# Patient Record
Sex: Male | Born: 1935 | Race: White | Hispanic: No | Marital: Married | Smoking: Never smoker
Health system: Southern US, Community
[De-identification: ages and names within clinical notes are randomized; demographics above are authoritative.]

## PROBLEM LIST (undated history)

## (undated) DIAGNOSIS — E039 Hypothyroidism, unspecified: Secondary | ICD-10-CM

## (undated) DIAGNOSIS — N189 Chronic kidney disease, unspecified: Secondary | ICD-10-CM

## (undated) DIAGNOSIS — E119 Type 2 diabetes mellitus without complications: Secondary | ICD-10-CM

## (undated) DIAGNOSIS — C61 Malignant neoplasm of prostate: Secondary | ICD-10-CM

## (undated) DIAGNOSIS — I4891 Unspecified atrial fibrillation: Secondary | ICD-10-CM

## (undated) DIAGNOSIS — F0390 Unspecified dementia without behavioral disturbance: Secondary | ICD-10-CM

## (undated) DIAGNOSIS — R739 Hyperglycemia, unspecified: Secondary | ICD-10-CM

## (undated) HISTORY — PX: OTHER SURGICAL HISTORY: SHX169

## (undated) HISTORY — PX: PROSTATE SURGERY: SHX751

## (undated) HISTORY — PX: SINUS EXPLORATION: SHX5214

---

## 1969-05-28 HISTORY — PX: OTHER SURGICAL HISTORY: SHX169

## 1999-09-18 ENCOUNTER — Ambulatory Visit: Admission: RE | Admit: 1999-09-18 | Discharge: 1999-09-18 | Payer: Self-pay | Admitting: Family Medicine

## 2001-06-11 ENCOUNTER — Encounter (INDEPENDENT_AMBULATORY_CARE_PROVIDER_SITE_OTHER): Payer: Self-pay | Admitting: Specialist

## 2001-06-11 ENCOUNTER — Observation Stay (HOSPITAL_COMMUNITY): Admission: RE | Admit: 2001-06-11 | Discharge: 2001-06-12 | Payer: Self-pay | Admitting: General Surgery

## 2002-04-16 ENCOUNTER — Inpatient Hospital Stay (HOSPITAL_COMMUNITY): Admission: EM | Admit: 2002-04-16 | Discharge: 2002-04-19 | Payer: Self-pay | Admitting: Emergency Medicine

## 2002-05-25 ENCOUNTER — Ambulatory Visit (HOSPITAL_COMMUNITY): Admission: RE | Admit: 2002-05-25 | Discharge: 2002-05-25 | Payer: Self-pay | Admitting: Gastroenterology

## 2002-05-25 ENCOUNTER — Encounter (INDEPENDENT_AMBULATORY_CARE_PROVIDER_SITE_OTHER): Payer: Self-pay

## 2002-06-22 ENCOUNTER — Observation Stay (HOSPITAL_COMMUNITY): Admission: RE | Admit: 2002-06-22 | Discharge: 2002-06-23 | Payer: Self-pay | Admitting: General Surgery

## 2002-06-22 ENCOUNTER — Encounter (INDEPENDENT_AMBULATORY_CARE_PROVIDER_SITE_OTHER): Payer: Self-pay

## 2002-08-02 ENCOUNTER — Emergency Department (HOSPITAL_COMMUNITY): Admission: EM | Admit: 2002-08-02 | Discharge: 2002-08-02 | Payer: Self-pay | Admitting: Emergency Medicine

## 2003-02-04 ENCOUNTER — Inpatient Hospital Stay (HOSPITAL_COMMUNITY): Admission: EM | Admit: 2003-02-04 | Discharge: 2003-02-09 | Payer: Self-pay | Admitting: Emergency Medicine

## 2003-02-05 ENCOUNTER — Encounter: Payer: Self-pay | Admitting: Internal Medicine

## 2003-02-06 ENCOUNTER — Encounter: Payer: Self-pay | Admitting: Internal Medicine

## 2007-12-27 ENCOUNTER — Inpatient Hospital Stay (HOSPITAL_COMMUNITY): Admission: EM | Admit: 2007-12-27 | Discharge: 2007-12-30 | Payer: Self-pay | Admitting: Emergency Medicine

## 2009-09-17 ENCOUNTER — Inpatient Hospital Stay (HOSPITAL_COMMUNITY): Admission: EM | Admit: 2009-09-17 | Discharge: 2009-09-19 | Payer: Self-pay | Admitting: Emergency Medicine

## 2010-08-09 ENCOUNTER — Other Ambulatory Visit: Payer: Self-pay | Admitting: Dermatology

## 2010-08-15 LAB — GLUCOSE, CAPILLARY
Glucose-Capillary: 112 mg/dL — ABNORMAL HIGH (ref 70–99)
Glucose-Capillary: 156 mg/dL — ABNORMAL HIGH (ref 70–99)
Glucose-Capillary: 161 mg/dL — ABNORMAL HIGH (ref 70–99)
Glucose-Capillary: 179 mg/dL — ABNORMAL HIGH (ref 70–99)

## 2010-08-15 LAB — DIFFERENTIAL
Basophils Absolute: 0.1 10*3/uL (ref 0.0–0.1)
Basophils Relative: 0 % (ref 0–1)
Basophils Relative: 1 % (ref 0–1)
Eosinophils Absolute: 0.2 10*3/uL (ref 0.0–0.7)
Eosinophils Relative: 1 % (ref 0–5)
Lymphocytes Relative: 8 % — ABNORMAL LOW (ref 12–46)
Lymphs Abs: 3.9 10*3/uL (ref 0.7–4.0)
Monocytes Relative: 9 % (ref 3–12)
Neutro Abs: 18.4 10*3/uL — ABNORMAL HIGH (ref 1.7–7.7)
Neutrophils Relative %: 66 % (ref 43–77)
Neutrophils Relative %: 86 % — ABNORMAL HIGH (ref 43–77)

## 2010-08-15 LAB — CBC
HCT: 34.9 % — ABNORMAL LOW (ref 39.0–52.0)
Hemoglobin: 13.4 g/dL (ref 13.0–17.0)
MCHC: 32.9 g/dL (ref 30.0–36.0)
MCHC: 33 g/dL (ref 30.0–36.0)
MCHC: 33.4 g/dL (ref 30.0–36.0)
MCV: 92.7 fL (ref 78.0–100.0)
MCV: 93.4 fL (ref 78.0–100.0)
Platelets: 132 10*3/uL — ABNORMAL LOW (ref 150–400)
Platelets: 174 10*3/uL (ref 150–400)
RBC: 3.77 MIL/uL — ABNORMAL LOW (ref 4.22–5.81)
RDW: 13.7 % (ref 11.5–15.5)
RDW: 14 % (ref 11.5–15.5)
WBC: 16.7 10*3/uL — ABNORMAL HIGH (ref 4.0–10.5)

## 2010-08-15 LAB — BASIC METABOLIC PANEL
Chloride: 103 mEq/L (ref 96–112)
Creatinine, Ser: 1.7 mg/dL — ABNORMAL HIGH (ref 0.4–1.5)
GFR calc Af Amer: 46 mL/min — ABNORMAL LOW (ref >60)
Glucose, Bld: 285 mg/dL — ABNORMAL HIGH (ref 70–99)

## 2010-08-15 LAB — CULTURE, BLOOD (ROUTINE X 2)

## 2010-08-15 LAB — URINALYSIS, ROUTINE W REFLEX MICROSCOPIC
Glucose, UA: 500 mg/dL — AB
Ketones, ur: NEGATIVE mg/dL
Leukocytes, UA: NEGATIVE
Protein, ur: NEGATIVE mg/dL
pH: 6.5 (ref 5.0–8.0)

## 2010-08-15 LAB — HEMOGLOBIN A1C
Hgb A1c MFr Bld: 8.3 % — ABNORMAL HIGH (ref <5.7)
Mean Plasma Glucose: 192 mg/dL — ABNORMAL HIGH (ref <117)

## 2010-08-15 LAB — D-DIMER, QUANTITATIVE: D-Dimer, Quant: 0.42 ug/mL-FEU (ref 0.00–0.48)

## 2010-08-15 LAB — SEDIMENTATION RATE: Sed Rate: 15 mm/hr (ref 0–16)

## 2010-10-10 NOTE — H&P (Signed)
NAME:  [name], Daniel NO.:  [name]   MEDICAL RECORD NO.:  [name]          PATIENT TYPE:  EMS   LOCATION:  ED                           FACILITY:  [facility] [place]   PHYSICIAN:  Ramiro Harvest, MD    DATE OF BIRTH:  1913/11/17   DATE OF ADMISSION:  12/27/2007  DATE OF DISCHARGE:                              HISTORY & PHYSICAL   PRIMARY CARE PHYSICIAN:  Dr. Tally Joe of [facility].   HISTORY OF PRESENT ILLNESS:  [name] is a 77 year old white male  with a history of chronically deformed right lower extremity secondary  to multiple previous surgeries for melanoma, which was done over 30  years ago, history of type 2 diabetes, hypertension, chronic venous  stasis changes, who presents to the ED with a 1-day history of chills,  right lower extremity pain, erythema and warmth.  The patient denies any  radiation of the pain.  The pain is mainly from the knee downwards.  No  fever, no drainage, no edema.  The patient denies any chest pain.  No  shortness of breath, no nausea, no vomiting, no abdominal pain, no  diarrhea, no constipation.  No cough, no melena, no hematemesis, no  hematochezia.  No focal neurological symptoms.  In the ED, the patient  was found to have a right lower extremity cellulitis.  White count of  18.7, hemoglobin of 14.1, hematocrit of 41, platelets of 169 and an ANC  of 17.1.  UA was bland.  BMET with a sodium of 138, potassium 4.0,  chloride 102, bicarb 26, glucose 136, BUN 35, creatinine 1.86, calcium  of 9.5.  Chest x-ray was negative.  We were called to admit the patient  for further evaluation and management.   ALLERGIES:  NO KNOWN DRUG ALLERGIES.   PAST MEDICAL HISTORY:  1. Recurrent cellulitis of the right lower extremity.  2. Type 2 diabetes.  3. Hypertension.  4. History of melanoma of the right lower extremity, approximately 30      years ago with multiple surgical resections and skin grafts.  5. Chronic venous stasis  changes.  6. Chronic deafness.  7. BPH, status post TURP.  8. Recurrent villous adenoma of the distal rectum with high-grade      dysplasia, status post transanal excision of neoplastic rectal      polyp per Dr. Derrell Lolling on June 11, 2001 and June 22, 2002.  9. Multiple small colonic polyps, status post polypectomy per      colonoscopy of May 25, 2002.  10.  Sigmoid diverticulosis per      colonoscopy of May 25, 2002.   HOME MEDICATIONS:  1. Amaryl 4 mg p.o. daily.  2. Maxzide 37.5/25 mg p.o. daily.  3. Zymar eye drops 1 drop to the right eye q.i.d., 0.3%.  4. Glucophage 1 gm b.i.d.  5. Lipitor 20 mg daily, which was last filled Oct 17, 2007.  These      medications were obtained from the pharmacist.   SOCIAL HISTORY:  The patient lives in [place] with his wife.  He is a  retired Optician, dispensing.  He  denies any tobacco use.  No alcohol use.  No IV  drug use.  Has two children, one son and one daughter, all of whom are  healthy.   FAMILY HISTORY:  Noncontributory.   REVIEW OF SYSTEMS:  As per HPI.   PHYSICAL EXAMINATION:  VITAL SIGNS:  Temperature 98.9, blood pressure  146/104, down to 154/72, pulse of 98, respiratory rate 20. Satting 97%  on room air.  GENERAL:  Patient in no apparent distress.  HEENT:  Normocephalic, atraumatic.  Pupils equal, round and reactive to  light.  Extraocular movements intact.  Oropharynx is clear.  No lesions,  no exudates.  Dry mucous membranes.  NECK:  Supple.  No lymphadenopathy.  RESPIRATORY:  Lungs are clear to auscultation bilaterally.  No wheezes,  no rhonchi, no crackles.  CARDIOVASCULAR:  Regular rate and rhythm.  No  murmurs, rubs or gallops.  ABDOMEN:  Soft, nontender, nondistended.  Positive bowel sounds.  EXTREMITIES:  No clubbing or cyanosis.  Right lower extremity is  deformed secondary to previous surgeries on the distal leg.  Right lower  extremity with erythema extending from the foot to the upper five,  positive for  warmth.  Positive tenderness to palpation.  No drainage.  No edema.  NEUROLOGICAL:  The patient is alert and oriented x3.  Cranial nerves II-  XII are grossly intact.  No focal deficits.   LABORATORY DATA:  Sodium 138, potassium 4.0, chloride 102, bicarb 26,  BUN 35, creatinine 1.86, glucose of 136, calcium of 9.5.  CBC - white  count 18.7, hemoglobin 14.1, platelets 163, hematocrit 41.0, ANC of  17.1.  Chest x-ray, no acute chest process.  UA is yellow clear,  specific gravity 1.014, pH of 7.5, glucose negative, bilirubin negative,  ketones trace, blood negative, protein negative, urobilinogen 0.2,  nitrite negative, leukocytes negative.   ASSESSMENT AND PLAN:  [name] is a 77 year old gentleman  with history of deformed right lower extremity secondary to multiple  previous surgeries for melanoma over 30 years ago, history of type 2  diabetes, hypertension, chronic venous stasis changes, who presents to  the ED with 1 day history of chills, right lower extremity pain,  erythema and warmth.  Found to have a right lower extremity cellulitis.   1. Right lower extremity cellulitis.  Will admit the patient to a      regular bed.  Check blood cultures x2.  Check a hepatic panel.      Check coags.  Will place on clindamycin 900 mg IV q.8 h.  Pain      management with Darvocet, keep lower extremity elevated and      monitor.  2. Type 2 diabetes.  Check a hemoglobin A1c and place on sliding scale      insulin.  3. Dehydration.  IV fluids.  4. Acute renal failure.  Baseline creatinine of 1.3 per E chart in      2004, likely prerenal versus renal versus post renal.  Check a      fractional excretion of sodium, IV fluids.  Follow creatinine.  If      no improvement, will check a renal ultrasound.  5. Hypertension.  Hold blood pressure medications for now.  6. Leukocytosis secondary to problem #1.  Urinalysis was negative for      urinary tract infection.  Chest x-ray is clear.  See  problem #1.  7. Benign prostatic hypertrophy, status post transurethral resection      of prostate.  8.  Chronic deafness.  9. Recurrent villous adenoma of the distal rectum, status post      excision x2.  10.Prophylaxis; Protonix for GI prophylaxis.  Heparin for DVT      prophylaxis.   I has been pleasure taking care of Mr. Hobert Poplaski.      Ramiro Harvest, MD  Electronically Signed     DT/MEDQ  D:  12/27/2007  T:  12/27/2007  Job:  [id number]   cc:   Tally Joe, M.D.  Fax: [phone]

## 2010-10-13 NOTE — Op Note (Signed)
NAME:  [name], [name] NO.:  [id number]   MEDICAL RECORD NO.:  [id number]                   PATIENT TYPE:   LOCATION:                                       FACILITY:   PHYSICIAN:  Bernette Redbird, M.D.                DATE OF BIRTH:  February 16, 1914   DATE OF PROCEDURE:  05/25/2002  DATE OF DISCHARGE:                                 OPERATIVE REPORT   PROCEDURE:  Colonoscopy with polypectomy and biopsy.   INDICATIONS FOR PROCEDURE:  20 six year-old who is one year status post  transanal excision of a highly dysplastic rectal polyp at the anorectal  junction.   FINDINGS:  Recurrence of the rectal polyp.  Small scattered colonic polyps  removed.  Mild sigmoid diverticulosis.   PROCEDURE:  The nature, purpose and risks of the procedure were familiar to  the patient from prior examination and he provided written consent.  Sedation was Fentanyl 50 mcg and Versed 6 mg IV without clinical  instability. His minimal 02 saturation was transiently down to 86%, but it  spontaneously came back into the 90's after we turned up the 02 flow rate.   The Olympus adult video colonoscope was quite easily advanced around the  colon, using some external abdominal compression to control looping.  The  cecum was reached and pullback was initiated.   The main finding on this exam was apparent recurrence of the distal rectal  polyp removed by transanal excision by Dr. Claud Kelp about a year ago.  Interestingly, the polyp was similar in size and location to that noted and  excised a year ago.  Specifically, the polyp was just above the anorectal  junction or dentate line and probably measured about 1.5 to 2.0 cm across in  greatest dimension, probably about 1.5 cm high.  It was soft, fleshy and  friable.  There was no firmness or hardness to it nor any obvious necrosis  or ulceration to suggest malignancy.  Multiple biopsies were obtained of it  in the retroflex  orientation which was the best way to view the lesion.  Antegrade, it appeared that there was just a margin of a few mm between the  inferior margin of the polyp and the dentate line.   In addition to that main lesion, I encountered several small or diminutive  polyps on this exam.  The largest was about 4 mm across and was removed by  snare technique at about 30 to 35 cm from the external anal opening and then  there were several diminutive sessile polyps scattered elsewhere in the  colon removed by cold biopsy technique.   There was no sign of frank cancer, colitis or vascular malformations.   The patient's prostate gland felt unremarkable.  Interestingly, on digital  exam, the small distal rectal polyp was really not palpable to my exam,  probably due to its very soft character.  The patient tolerated this procedure well and there were no apparent  complications.   IMPRESSION:  1. Apparent recurrence of fairly large friable distal rectal polyp,     pathology pending.  2.     Multiple small colon polyps removed as described above.  3. Sigmoid diverticulosis.   PLAN:  Await pathology results.                                                Bernette Redbird, M.D.    RB/MEDQ  D:  05/25/2002  T:  05/25/2002  Job:  [id number]   cc:   Meredith Staggers, M.D.  [address], [address]  [place]  [place] [address]  Fax: [phone]   Angelia Mould. Derrell Lolling, M.D.  [address], [address]  [place]  [place] [address]  Fax: [phone]

## 2010-10-13 NOTE — Discharge Summary (Signed)
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  He is quite vigorous, but  has a chronically deformed right leg secondary to multiple previous melanoma  surgeries over the previous 30 years.  Ten years ago a chemo perfusion  procedure was carried out on the leg.  The leg shows evidence of chronic  venous stasis.  On Monday, prior to admission, he noted the onset of  increased pain in the leg with associated redness in the foot area.  Over  the subsequent four days, the redness spread to mid thigh, and was  associated with malaise.  The patient recalls that he had one episode of  chills and rigors.  Also noteworthy, was that the patient's diabetes which  was normally under very good regulation became uncontrolled with a blood  sugar up to 296.  Examination of the leg showed evidence of a rather  profound cellulitis, and therefore the patient was admitted for IV  antibiotics.   PHYSICAL EXAMINATION:  VITAL SIGNS:  Blood pressure 170/83, pulse 75,  respirations 20.  HEENT:  Within normal limits.  CHEST:  Clear.  BACK:  No CVA or point tenderness.  CARDIOVASCULAR:  Normal S1 and S2 without murmurs, rubs, or gallops.  ABDOMEN:  Benign.  Normal bowel sounds, without masses, organomegaly, or  tenderness.  NEUROLOGIC:  Within normal limits.  EXTREMITIES:  The right leg was quite deformed in appearance secondary to  multiple previous biopsy and resection procedures on the distal leg.  The  patient also had  extensive lymph node biopsies, and had chronic venous  stasis changes.  There was redness in the leg extending from the foot region  up to the area of the mid thigh.  This was a generalized redness.  There was  no obvious lymphangitis.   LABORATORY DATA:  Subsequent to admission studies obtained included a venous  Doppler study which showed no evidence of deep vein thrombosis or  superficial thrombosis.  Laboratory studies obtained included a CBC which  revealed a white blood cell count of 19,900, hemoglobin 14.4, creatinine  1.3, BUN 25.  Liver profile was normal.  Blood cultures grew gram positive  cocci from one bottle, no final identification has been obtained from this  finding as of the time of this dictation.   HOSPITAL COURSE:  On admission, the patient was started on Zosyn  3.375 g IV  q.6h., pain medication was administered in the form of morphine and  Darvocet.  The patient's other usual medications were continued.  He was  placed on a sliding scale insulin regimen.  The patient's vital signs were  very carefully monitored.  Temperature reached its highest point of 99.4 on  04/16/02.  After that, it was in the range of 98 to 99.  Blood sugars were  in the range of 150 to 200.  The patient's leg was inspected on a b.i.d.  basis.  There was a marked improvement in the appearance of the leg.  Redness had essentially resolved by 04/19/02.  The patient still of course  had chronic venous stasis changes in the leg.  By 04/19/02, as the patient  did not appear to be toxic, was afebrile, and had shown marked improvement  in his overall appearance of his leg, it was felt prudent to discharge at  that time.   DISCHARGE DIAGNOSES:  1. Cellulitis of the right leg.  2. Diabetes.  3. History of melanoma right leg, status post chemo perfusion, status post     lymph node resection.  4. Chronic venous stasis.  5. Hypertension.  6. Benign prostatic hypertrophy, status post transurethral  resection of     prostate.  7. Chronic deafness.   DISCHARGE MEDICATIONS:  1. Accupril 20 mg q.d.  2. Amaryl 4 mg q.d.  3. Glucophage XR 500 mg two tablets daily.  4. Augmentin 875 mg b.i.d., which the patient will be advised to take for 10     additional days.   FOLLOWUP:  I instructed the patient to make a return appointment with Dr.  Rodman Key on Wednesday or Thursday to reassess his status.   CONDITION ON DISCHARGE:  Good.                                               Sherin Quarry, MD    SY/MEDQ  D:  04/19/2002  T:  04/19/2002  Job:  [id number]   cc:   Tama Headings. Marina Goodell, M.D.  [address]  [place]  [place] [address]  Fax: [phone]

## 2010-10-13 NOTE — Discharge Summary (Signed)
NAME:  [name], [name]                       ACCOUNT NO.:  [id number]   MEDICAL RECORD NO.:  [id number]                   PATIENT TYPE:  INP   LOCATION:  0358                                 [facility]ACILITY:  [facility]   PHYSICIAN:  [name], M.D.              DATE OF BIRTH:  03-Apr-1914   DATE OF ADMISSION:  02/04/2003  DATE OF DISCHARGE:  02/09/2003                                 DISCHARGE SUMMARY   PRIMARY PHYSICIAN:  [name], M.D.   DISCHARGE DIAGNOSES:  1. Recurrent cellulitis right lower extremity, improved.  Home on antibiotic     therapy.  2. Type 2 diabetes, well controlled.  3. Hypertension, well controlled.   MEDICATIONS:  1. Amaryl 4 mg p.o. daily.  2. Maxzide 37.5/25 mg p.o. daily.  3. Glucophage 1000 mg p.o. daily.  4. Keflex 500 mg p.o. b.i.d. x16 days.   ALLERGIES:  NKDA.   PROCEDURE:  None.   HISTORY OF PRESENT ILLNESS:  This is a gentleman who had melanoma in his  right lower extremity 33 years ago with multiple surgical resections and  skin grafts.  He has had recurrent bouts of cellulitis including one episode  that was approximately 10 days prior to presentation.  He completed his  antibiotic therapy and 24 hours before admission noted increased swelling,  discomfort, erythema of the left lower extremity.  Denies any constitutional  symptoms or paraesthesias.  Was seen by his primary M.D. and referred to the  emergency room for further treatment.  He is admitted for further treatment  of recurrent cellulitis.   HOSPITAL COURSE:  The patient is admitted to a regular medical bed, provided  with IV Zosyn initially, maintained on his outpatient regimen for his  diabetes, and infectious disease consult was called secondary to the  patient's history of diabetes as well as the fact that the cellulitis  appears to be recurrent.   [name], M.D. of infectious disease did consult on this patient.  Changed his antibiotics from Zosyn to  Rocephin with excellent clearing of  the infection, reduction in edema, erythema, and discomfort on the part of  the patient.  On the day before discharge patient has been changed to p.o.  antibiotics which he will continue for a total of 21 days.  He will need to  follow up with his primary care [name] in approximately two weeks to  ensure that his infection has completely cleared.   The patient did undergo MRI of the right lower extremity due to recurrent  nature of infection.  MRI found only acute severe cellulitis with soft  tissue swelling.  There were no indications of blood clots, osteomyelitis,  fasciitis, or myocytes.  The patient also complained of right knee  discomfort during this admission.  He was found to have a very small right  knee joint effusion not large enough for arthrocentesis.  This was thought  to  be secondary to the patient's osteoarthritis and he was treated with pain  medication to keep him comfortable.  The patient also underwent physical  therapy evaluation prior to discharge to assure that he would be strong  enough and stable enough in his gait to be able to ambulate at home.  He was  found to be fully functional with good range of motion and weightbearing on  the right lower extremity.  Does not need any assistive device at this time.   At time of discharge patient is free of chest pain, shortness of breath, or  other discomfort.  Pain in the leg is minimal with ambulation only.  Temperature 98.2, blood pressure 117/64, pulse 64, respirations 16.   DISCHARGE LABORATORIES:  White blood cell count 9.5, hemoglobin 12.9,  hematocrit 37.7, platelet count 173,000.  Cholesterol 190, triglycerides  129, HDL 44, LDL 120.  Hemoglobin A1C 7.7.  Blood cultures are negative on  preliminary report x2.  Urinalysis was negative for ketones, glucose, or  infection.  PTT 37, PT 12.3, INR 0.9.  Sodium 139, potassium 4.3, glucose  86, BUN 33, creatinine 1.3.  Alkaline  phosphatase 48, total bilirubin 1.2,  AST 12, ALT 10.   CONSULTS:  [name], M.D. of infectious diseases.   DISPOSITION:  Discharged to home.   CONDITION ON DISCHARGE:  Good.   FOLLOWUP:  The patient is instructed to see [name], M.D. in two  weeks' time for reassessment of his leg.     [name], M.D.    SMD/MEDQ  D:  02/09/2003  T:  02/09/2003  Job:  [id number]   cc:   Daniel Situ. Flavia Shipper., M.D.  [address]  [place]  [place] [address]  Fax: [phone]   [name], M.D.  [address], [address]  [place]  [place] [address]  Fax: [phone]

## 2010-10-13 NOTE — Discharge Summary (Signed)
[name], [name]             ACCOUNT NO.:  [id number]   MEDICAL RECORD NO.:  [id number]          PATIENT TYPE:  INP   LOCATION:  1343                         FACILITY:  [facility]   PHYSICIAN:  Ramiro Harvest, MD    DATE OF BIRTH:  08-Feb-1914   DATE OF ADMISSION:  12/27/2007  DATE OF DISCHARGE:  12/30/2007                               DISCHARGE SUMMARY   PRIMARY CARE PHYSICIAN:  Tally Joe, M.D. of [facility].   DISCHARGE DIAGNOSES:  1. Recurrent right lower extremity cellulitis.  2. Acute renal failure.  3. Dehydration.  4. Hypertension.  5. Type 2 diabetes.  6. Leukocytosis secondary to problem #1.  7. Benign prostatic hypertrophy status post transurethral resection of      the prostate.  8. Chronic deafness.  9. Recurrent villous adenoma of the distal rectum status post excision      times two per Dr. Derrell Lolling of June 11, 2001 and June 22, 2002.  10.Multiple small colonic polyps status post polypectomy per      colonoscopy May 25, 2002.  11.Sigmoid diverticulosis per colonoscopy May 25, 2002.  12.Chronic venous stasis changes.  13.History of melanoma of the right lower extremity approximately 30      years ago with multiple surgical resections and skin grafts.   DISCHARGE MEDICATIONS:  1. Doxycycline 100 mg by mouth twice a day times 10 days.  2. Aspirin 81 mg by mouth daily.  3. Amaryl 4 mg by mouth daily.  4. Lipitor 20 mg by mouth daily.  5. Triamterene hydrochlorothiazide 25 mg by mouth daily to start on      January 02, 2008.   DISPOSITION AND FOLLOW UP:  The patient will be discharged to home.   The patient is to follow up with his PCP in 2 weeks.  On follow-up a  basic metabolic profile will need to be checked to follow up on the  patient's renal function.  CBC will also need to be checked to follow up  on the patient's white count.  The patient's lower extremity cellulitis  will need to be followed up for resolution of his cellulitis.   The  patient's diabetes will need to be reassessed as the patient's metformin  was discontinued on discharge secondary to increased renal function.  The patient's blood pressure will also need to be reassessed as well.   CONSULTATIONS:  None.   PROCEDURES:  1. Chest x-ray was performed on December 27, 2007, which showed no acute      chest process.  2. Renal ultrasound was ordered on December 28, 2007 that showed no      hydronephrosis, normal-sized kidneys and renal cysts.   BRIEF ADMITTING HISTORY AND PHYSICAL EXAMINATION:  Brief History:  The  patient is a 77 year old white gentleman with a history of chronically  deformed right lower extremity secondary to multiple previous surgeries  for melanoma, which were done approximately over 30 years ago, history  of Type 2 diabetes, hypertension, and chronic venous stasis changes who  presented to the ED with a 1-day history of chills, right lower  extremity pain, erythema and warmth.  The patient denies any radiation  of the pain.  The patient's pain is mainly from his knee down with no  fever, no drainage and no edema.  The patient denies any chest pain or  shortness of breath, no nausea or vomiting, no abdominal pain, no  diarrhea, no constipation, no cough, no melena, no hematemesis, no  hematochezia, and no focal neurological symptoms.  In the ED the patient  was found to have a right lower extremity cellulitis.  White count was  18.7, hemoglobin 14.4, hematocrit 41, platelets 169,000, and ANC 17.1.  UA was __________ .  BMET showed a sodium of 138, potassium of 4.0,  chloride of 102, bicarb of 26, BUN of 35, creatinine of 1.86, glucose of  136, and calcium of 9.5.  Chest x-ray done was negative.  We were called  to admit the patient for further evaluation and management.   Physical Examination on Admission:  VITAL SIGNS:  Temperature 98.9, blood pressure 146/104 down to 154/72,  pulse 98, respiratory rate 20, and satting 97% on room  air.  GENERAL APPEARANCE:  In general the patient is in no apparent distress.  HEENT:  Normocephalic and atraumatic.  Pupils are equal, round and react  to light.  Extraocular movements are intact.  Oropharynx is clear.  No  lesions.  No exudates.  Dry mucous membranes.  NECK:  The neck is supple.  No lymphadenopathy.  CHEST AND LUNGS:  Respiratory; lungs are clear to auscultation  bilaterally.  No wheezes.  No rhonchi.  No crackles.  HEART:  Cardiovascular with regular rate and rhythm.  No murmurs, rubs  or gallops.  ABDOMEN:  The abdomen is soft, nontender and nondistended with positive  bowel sounds.  EXTREMITIES:  The extremities show no clubbing or cyanosis.  The right  lower extremity is deformed secondary to previous surgeries on the  distal leg.  The right lower extremity has erythema extending from the  foot to the upper thigh; and, is positive for warmth as well as positive  tenderness to palpation.  No drainage and no edema.  NEUROLOGIC EXAMINATION:  Neurologically the patient is alert and  oriented x3.  Cranial nerves II-XII are grossly intact.  No focal  deficits.   LABORATORY DATA:  Admission labs:  Sodium 138, potassium 4.0, chloride  102, bicarb 26, BUN 35, creatinine 1.86, glucose 136, and calcium of  9.5.  CBC; white count 18.7, hemoglobin 14.1, platelets 163,000,  hematocrit 41.0, and ANC 17.1.  Chest x-ray with no acute chest process.  UA was yellow and clear with a specific gravity of 1.014, pH of 7.5,  glucose negative, bilirubin negative, ketones trace,  blood negative,  protein negative, urobilinogen 0.2, nitrite negative, and leukocytes  negative.   HOSPITAL COURSE:  Problem #1  Right lower extremity cellulitis.  The  patient was admitted to the hospital secondary to his history of  melanoma and multiple surgeries on his right lower extremity.  Blood  cultures were checked times two, which came back negative.  The  patient's lower extremity was kept elevated  throughout the  hospitalization.  The patient was initially placed on clindamycin 900 mg  IV every 8 hours.  The patient remained afebrile during the  hospitalization.  On the second day of hospitalization the patient had  an increase in his white count and his antibiotic coverage was thus  broadened with the addition of Rocephin.  The patient was followed.  The  patient's white count started to trend  down.  The patient continued to  show improved clinical symptoms.  The right lower extremity erythema  improved with decreased warmth and it was less tender to palpation.  The  patient was then changed to doxycycline 100 mg by mouth twice a day to  complete a 2-week course of his antibiotic coverage.  The patient will  follow up with his PCP as stated above.   Problem #2  Acute renal failure.  The patient was noted to be in acute  renal failure.  This was secondary to a prerenal azotemia.  The patient  was placed on IV fluids with gentle hydration and monitored throughout  the hospitalization.  A renal ultrasound was obtained, which was  negative for hydronephrosis and with results as stated above.  The  patient's renal function improved on a daily basis with gentle hydration  and by the day of discharge the patient was in improved condition.  A  basic metabolic profile will need to be checked on follow up to follow  up on the patient's renal function.   Problem #3  Dehydration.  The patient was placed on IV fluids throughout  the hospitalization and by the day of discharge the patient was  euvolemic.   Problem #4  Hypertension.  The patient's blood pressure medications were  held throughout the hospitalization secondary to dehydration.  The  patient will be restarted back on his antihypertensives 3 days post  discharge.   Problem #5  Type 2 diabetes.  A hemoglobin A-1-C was checked during the  hospitalization; on admission A-1-C was level 6.4.  The patient was  placed on sliding scale  insulin during the hospitalization.  His Amaryl  was held.  His metformin was discontinued secondary to his renal  function.  The patient will not be discharged home on his metformin  secondary to his acute renal failure.  This will need to be reassessed  per his PCP on follow up.   The rest of the patient's chronic medical issues were stable throughout  the hospitalization and by the day of discharge the patient was in  stable and improved condition.  The patient's vital signs on the day of  discharge revealed temperature of 97.7, pulse of 69, blood pressure of  154/78, respiratory rate of 18, and satting 96% on room air.  Labs on  the day of discharge showed sodium 140, potassium 4.6, chloride 109,  bicarb 29, BUN 21, creatinine 1.69, glucose of 123, and calcium of 8.5.  CBC; white count of 8.6, hemoglobin of 11.3, platelets of 154,000  hematocrit of 33.0, and ANC of 5.5.   It was a pleasure taking care of this patient.      Ramiro Harvest, MD  Electronically Signed     DT/MEDQ  D:  02/18/2008  T:  02/19/2008  Job:  [id number]   cc:   Tally Joe, M.D.  Fax: [phone]

## 2010-10-13 NOTE — Op Note (Signed)
NAME:  [name], [name]                       ACCOUNT NO.:  [id number]   MEDICAL RECORD NO.:  [id number]                   PATIENT TYPE:  AMB   LOCATION:  DAY                                  FACILITY:  [facility]   PHYSICIAN:  Angelia Mould. Derrell Lolling, M.D.             DATE OF BIRTH:  04-16-1914   DATE OF PROCEDURE:  06/22/2002  DATE OF DISCHARGE:                                 OPERATIVE REPORT   PREOPERATIVE DIAGNOSIS:  Recurrent villous adenoma of the distal rectum.   POSTOPERATIVE DIAGNOSIS:  Recurrent villous adenoma of the distal rectum.   OPERATION PERFORMED:  Transanal excision of recurrent villous adenoma of the  distal rectum.   OPERATIVE INDICATIONS:  This is an 77 year old white man who underwent  transanal excision of a villous adenoma of the distal rectum in the  posterior midline on 06/11/01.  There was high-grade dysplasia in that polyp.  He has been monitored with anoscopy since that time with no known  recurrence.  A recent colonoscopy on 05/25/02 found a small recurrent  polypoid mass in the distal rectum just above the dentate line.  A biopsy  was performed and showed focal high-grade dysplasia.  He is brought to the  operating room electively for reexcision of this area.   OPERATIVE TECHNIQUE:  Following the administration of a spinal anesthetic,  the patient was placed in the dorsal lithotomy position and the perianal  area prepped and draped in a sterile fashion.  Marcaine 0.5% with  epinephrine was used as local infiltration anesthetic.  Gentle dilatation of  the anal sphincter was performed.  The anoscope was inserted and ultimately  the operating lighted endoscope was used.  We were able to visualize what  appeared to be about a 1.5-cm partially sessile but partially pedunculated  polyp near the posterior midline, this appeared soft and fleshy and benign.  I placed a traction suture distally and then using electrocautery, very  carefully excised this area.  I  chose to go very deep and very wide at this  time, getting about an 8-10 mm margin in all areas, this was fairly easy to  see and to get mostly full-thickness muscle as well.  As I took the  dissection proximally, I would lift the polyp up off of the dissection plane  and then place closing sutures of 2-0 Vicryl to close the rectal mucosa in  the midline.  Ultimately, I was able to completely excise the polyp and it  was sent to pathology.  I completed the closure with interrupted  transversely placed sutures of 2-0 Vicryl.  About seven or eight such  sutures were required for closure.  A full-thickness rectal wall was used to  close.  This area was irrigated and observed for about 5 or 10 minutes.  The  anoscope was removed and reinserted a couple of times and there was no  bleeding or blood accumulation.  All the instruments  were removed.  The  patient tolerated the procedure well and was taken to the recovery room in  stable condition.  Estimated blood loss was about 30 cc.    COMPLICATIONS:  None.   SPONGE AND INSTRUMENT COUNTS:  Correct.                                               Angelia Mould. Derrell Lolling, M.D.    HMI/MEDQ  D:  06/22/2002  T:  06/22/2002  Job:  [id number]   cc:   Bernette Redbird, M.D.  [address] [place]  [place], [place] [address]  Fax: [phone]   Meredith Staggers, M.D.  [address], [address]  [place]  [place] [address]  Fax: [phone]

## 2010-10-13 NOTE — Op Note (Signed)
[facility]  Patient:    [name], [name] Visit Number: [id number] MRN: [id number]          Service Type: SUR Location: 1S 0005 01 Attending Physician:  Brandy Hale Dictated by:   Angelia Mould. Derrell Lolling, M.D. Proc. Date: 06/11/01 Admit Date:  06/11/2001   CC:         Meredith Staggers, M.D.  Florencia Reasons, M.D.   Operative Report  PREOPERATIVE DIAGNOSES:  Villous adenoma of the distal rectum with high grade dysplasia.  POSTOPERATIVE DIAGNOSES:  Villous adenoma of the distal rectum with high grade dysplasia.  OPERATION PERFORMED:  Transanal excision of neoplastic rectal polyp.  SURGEON:  Angelia Mould. Derrell Lolling, M.D.  INDICATIONS FOR PROCEDURE:  This is an 77 year old white man who has had some painless rectal bleeding for six months. A total colonoscopy performed by Dr. Matthias Hughs on May 09, 2001 showed a soft polyp in the posterior midline of the distal rectum just above the dentate line. Biopsies showed villous adenoma and one focus of high grade dysplasia, no malignancy identified. On exam, digital exam reveals a soft polypoid mass posteriorly. Anoscopy confirms a villous polyp about 2.5 to 3 cm in diameter in the posterior midline which is somewhat mobile although not truly pedunculated. The mucosa otherwise looks normal. He was brought to the operating room electively following a bowel prep.  OPERATIVE FINDINGS:  The polyp in the rectum was in the posterior midline and extended from about 1 cm above the dentate line to about 4 cm above the dentate line in axial length. The width of the polyp was about 2 cm transversely. The rest of the mucosa looked normal. The polyp itself was quite soft and fleshy but I did not feel any obvious hard malignant changes.  OPERATIVE TECHNIQUE:  Following the induction of spinal anesthetic and sedation, the patient was placed in the dorsal lithotomy position. The perianal area was prepped and  draped in a sterile fashion. Digital rectal exam was performed. We gently dilated the anal sphincter. We inserted the anoscope and observed the polyp with findings as described above. Stay sutures of 2-0 Vicryl were placed inferiorly and superiorly above and below the polyp about 1 cm. Great care was taken to avoid any injury to the sphincter muscles. We marked the area of excision using the electrocautery trying to get 7-10 mm of normal mucosa on all sides. During the excision, the polyp fragmented somewhat but I was quite comfortable that we had got complete excision with the margin both circumferentially and deep. The polyp did appear to extend below the mucosal level. We excised the polyp using electrocautery and removed it and sent it to pathology. Hemostasis was adequate and achieved with electrocautery. I closed the rectal mucosa full-thickness with interrupted sutures of 2-0 Vicryl. The suture line in the posterior midline of the rectal mucosa was in a sagittal plane. The closure was quite nice. At the completion of the closure, there was no hematoma, there was no active bleeding and the entire area by inspection and palpation was closed and there was no defect. This area was irrigated. A piece of Gelfoam gauze was placed over the incision and the instruments removed. External gauze bandages placed and the patient taken to the recovery room in stable condition. Estimated blood loss was about 30 cc, complications none. Sponge, needle and instrument counts were correct. ictated by:   Angelia Mould. Derrell Lolling, M.D. Attending Physician:  Brandy Hale DD:  06/11/01 TD:  06/11/01 Job: [id number]

## 2010-12-22 ENCOUNTER — Encounter (INDEPENDENT_AMBULATORY_CARE_PROVIDER_SITE_OTHER): Payer: Self-pay | Admitting: Ophthalmology

## 2011-02-23 LAB — CBC
Hemoglobin: 10.9 — ABNORMAL LOW
Hemoglobin: 11.3 — ABNORMAL LOW
MCHC: 33.5
MCHC: 33.8
MCHC: 34.1
MCHC: 34.4
MCV: 94
MCV: 94.2
MCV: 95.3
Platelets: 135 — ABNORMAL LOW
Platelets: 154
Platelets: 163
RBC: 3.44 — ABNORMAL LOW
RDW: 13.4
RDW: 13.5
WBC: 12 — ABNORMAL HIGH
WBC: 18.7 — ABNORMAL HIGH
WBC: 20.9 — ABNORMAL HIGH

## 2011-02-23 LAB — CULTURE, BLOOD (ROUTINE X 2)

## 2011-02-23 LAB — DIFFERENTIAL
Basophils Absolute: 0
Basophils Absolute: 0
Basophils Absolute: 0.1
Basophils Relative: 0
Basophils Relative: 0
Basophils Relative: 0
Basophils Relative: 0
Eosinophils Absolute: 0
Eosinophils Absolute: 0.1
Eosinophils Absolute: 0.2
Lymphocytes Relative: 22
Lymphs Abs: 1.8
Lymphs Abs: 2.1
Monocytes Absolute: 0.9
Monocytes Absolute: 1.2 — ABNORMAL HIGH
Monocytes Relative: 10
Neutro Abs: 5.5
Neutro Abs: 8.7 — ABNORMAL HIGH
Neutrophils Relative %: 65
Neutrophils Relative %: 83 — ABNORMAL HIGH
Neutrophils Relative %: 92 — ABNORMAL HIGH

## 2011-02-23 LAB — GLUCOSE, CAPILLARY
Glucose-Capillary: 115 — ABNORMAL HIGH
Glucose-Capillary: 157 — ABNORMAL HIGH
Glucose-Capillary: 162 — ABNORMAL HIGH
Glucose-Capillary: 169 — ABNORMAL HIGH
Glucose-Capillary: 170 — ABNORMAL HIGH
Glucose-Capillary: 230 — ABNORMAL HIGH
Glucose-Capillary: 237 — ABNORMAL HIGH
Glucose-Capillary: 42 — ABNORMAL LOW
Glucose-Capillary: 78

## 2011-02-23 LAB — URINALYSIS, ROUTINE W REFLEX MICROSCOPIC
Glucose, UA: NEGATIVE
Hgb urine dipstick: NEGATIVE
Specific Gravity, Urine: 1.014
Urobilinogen, UA: 0.2
pH: 7.5

## 2011-02-23 LAB — BASIC METABOLIC PANEL
BUN: 31 — ABNORMAL HIGH
BUN: 35 — ABNORMAL HIGH
CO2: 25
CO2: 26
Calcium: 8.5
Calcium: 9.5
Chloride: 102
Chloride: 104
Chloride: 106
Creatinine, Ser: 1.69 — ABNORMAL HIGH
Creatinine, Ser: 1.83 — ABNORMAL HIGH
Creatinine, Ser: 1.86 — ABNORMAL HIGH
GFR calc Af Amer: 44 — ABNORMAL LOW
GFR calc non Af Amer: 38 — ABNORMAL LOW
Glucose, Bld: 123 — ABNORMAL HIGH
Glucose, Bld: 68 — ABNORMAL LOW
Sodium: 137
Sodium: 140

## 2011-02-23 LAB — HEPATIC FUNCTION PANEL
Alkaline Phosphatase: 54
Indirect Bilirubin: 1.8 — ABNORMAL HIGH
Total Bilirubin: 2 — ABNORMAL HIGH

## 2011-02-23 LAB — URINE CULTURE: Colony Count: >=100000

## 2011-02-23 LAB — HEMOGLOBIN A1C
Hgb A1c MFr Bld: 6.4 — ABNORMAL HIGH
Mean Plasma Glucose: 151

## 2011-02-23 LAB — CREATININE, URINE, RANDOM: Creatinine, Urine: 67.5

## 2011-08-06 ENCOUNTER — Other Ambulatory Visit: Payer: Self-pay | Admitting: Dermatology

## 2011-08-06 ENCOUNTER — Other Ambulatory Visit: Payer: Self-pay | Admitting: Urology

## 2011-08-06 DIAGNOSIS — N402 Nodular prostate without lower urinary tract symptoms: Secondary | ICD-10-CM

## 2011-08-10 ENCOUNTER — Encounter (HOSPITAL_COMMUNITY)
Admission: RE | Admit: 2011-08-10 | Discharge: 2011-08-10 | Disposition: A | Discharge status: Home / Self Care | Payer: Medicare Other | Source: Ambulatory Visit | Attending: Urology | Admitting: Urology

## 2011-08-10 DIAGNOSIS — N402 Nodular prostate without lower urinary tract symptoms: Secondary | ICD-10-CM

## 2011-08-10 DIAGNOSIS — Q638 Other specified congenital malformations of kidney: Secondary | ICD-10-CM | POA: Insufficient documentation

## 2011-08-10 DIAGNOSIS — C4492 Squamous cell carcinoma of skin, unspecified: Secondary | ICD-10-CM | POA: Insufficient documentation

## 2011-08-10 DIAGNOSIS — C7951 Secondary malignant neoplasm of bone: Secondary | ICD-10-CM | POA: Insufficient documentation

## 2011-08-10 MED ORDER — TECHNETIUM TC 99M MEDRONATE IV KIT
22.9000 | PACK | Freq: Once | INTRAVENOUS | Status: AC | PRN
  Administered 2011-08-10: 22.9 via INTRAVENOUS

## 2011-10-08 ENCOUNTER — Inpatient Hospital Stay (HOSPITAL_COMMUNITY)
Admission: EM | Admit: 2011-10-08 | Discharge: 2011-10-12 | DRG: 309 | Disposition: A | Discharge status: Skilled Nursing Facility | Payer: Medicare Other | Attending: Internal Medicine | Admitting: Internal Medicine

## 2011-10-08 ENCOUNTER — Encounter (HOSPITAL_COMMUNITY): Payer: Self-pay

## 2011-10-08 ENCOUNTER — Emergency Department (HOSPITAL_COMMUNITY): Payer: Medicare Other

## 2011-10-08 DIAGNOSIS — E039 Hypothyroidism, unspecified: Secondary | ICD-10-CM | POA: Diagnosis present

## 2011-10-08 DIAGNOSIS — I4891 Unspecified atrial fibrillation: Secondary | ICD-10-CM

## 2011-10-08 DIAGNOSIS — I10 Essential (primary) hypertension: Secondary | ICD-10-CM | POA: Diagnosis present

## 2011-10-08 DIAGNOSIS — N189 Chronic kidney disease, unspecified: Secondary | ICD-10-CM | POA: Diagnosis present

## 2011-10-08 DIAGNOSIS — R531 Weakness: Secondary | ICD-10-CM

## 2011-10-08 DIAGNOSIS — E032 Hypothyroidism due to medicaments and other exogenous substances: Secondary | ICD-10-CM

## 2011-10-08 DIAGNOSIS — IMO0002 Reserved for concepts with insufficient information to code with codable children: Secondary | ICD-10-CM

## 2011-10-08 DIAGNOSIS — E119 Type 2 diabetes mellitus without complications: Secondary | ICD-10-CM | POA: Diagnosis present

## 2011-10-08 DIAGNOSIS — R5381 Other malaise: Secondary | ICD-10-CM | POA: Diagnosis present

## 2011-10-08 DIAGNOSIS — R5383 Other fatigue: Secondary | ICD-10-CM | POA: Diagnosis present

## 2011-10-08 DIAGNOSIS — I129 Hypertensive chronic kidney disease with stage 1 through stage 4 chronic kidney disease, or unspecified chronic kidney disease: Secondary | ICD-10-CM | POA: Diagnosis present

## 2011-10-08 DIAGNOSIS — N400 Enlarged prostate without lower urinary tract symptoms: Secondary | ICD-10-CM | POA: Diagnosis present

## 2011-10-08 DIAGNOSIS — I4892 Unspecified atrial flutter: Secondary | ICD-10-CM | POA: Diagnosis present

## 2011-10-08 DIAGNOSIS — N179 Acute kidney failure, unspecified: Secondary | ICD-10-CM | POA: Diagnosis present

## 2011-10-08 HISTORY — DX: Hyperglycemia, unspecified: R73.9

## 2011-10-08 HISTORY — DX: Hypothyroidism, unspecified: E03.9

## 2011-10-08 HISTORY — DX: Type 2 diabetes mellitus without complications: E11.9

## 2011-10-08 LAB — URINALYSIS, ROUTINE W REFLEX MICROSCOPIC
Ketones, ur: NEGATIVE mg/dL
Leukocytes, UA: NEGATIVE
Nitrite: NEGATIVE
Protein, ur: 100 mg/dL — AB
pH: 6 (ref 5.0–8.0)

## 2011-10-08 LAB — DIFFERENTIAL
Basophils Absolute: 0 10*3/uL (ref 0.0–0.1)
Basophils Relative: 0 % (ref 0–1)
Eosinophils Absolute: 0.1 10*3/uL (ref 0.0–0.7)
Monocytes Relative: 7 % (ref 3–12)
Neutro Abs: 7.8 10*3/uL — ABNORMAL HIGH (ref 1.7–7.7)
Neutrophils Relative %: 70 % (ref 43–77)

## 2011-10-08 LAB — CBC
Hemoglobin: 13.5 g/dL (ref 13.0–17.0)
MCH: 30.1 pg (ref 26.0–34.0)
MCHC: 32.9 g/dL (ref 30.0–36.0)
Platelets: 223 10*3/uL (ref 150–400)
RDW: 13.9 % (ref 11.5–15.5)

## 2011-10-08 LAB — POCT I-STAT TROPONIN I

## 2011-10-08 LAB — COMPREHENSIVE METABOLIC PANEL
AST: 32 U/L (ref 0–37)
Albumin: 3.9 g/dL (ref 3.5–5.2)
Alkaline Phosphatase: 103 U/L (ref 39–117)
BUN: 40 mg/dL — ABNORMAL HIGH (ref 6–23)
Chloride: 101 mEq/L (ref 96–112)
Creatinine, Ser: 1.5 mg/dL — ABNORMAL HIGH (ref 0.50–1.35)
Potassium: 4.7 mEq/L (ref 3.5–5.1)
Total Protein: 7.2 g/dL (ref 6.0–8.3)

## 2011-10-08 LAB — PRO B NATRIURETIC PEPTIDE: Pro B Natriuretic peptide (BNP): 8566 pg/mL — ABNORMAL HIGH (ref 0–450)

## 2011-10-08 LAB — URINE MICROSCOPIC-ADD ON

## 2011-10-08 LAB — TSH: TSH: 3.513 u[IU]/mL (ref 0.350–4.500)

## 2011-10-08 LAB — CARDIAC PANEL(CRET KIN+CKTOT+MB+TROPI): Total CK: 19 U/L (ref 7–232)

## 2011-10-08 LAB — T4: T4, Total: 7.6 ug/dL (ref 5.0–12.5)

## 2011-10-08 MED ORDER — DILTIAZEM HCL 100 MG IV SOLR
5.0000 mg/h | Freq: Once | INTRAVENOUS | Status: AC
  Administered 2011-10-08: 5 mg/h via INTRAVENOUS
  Filled 2011-10-08: qty 100

## 2011-10-08 MED ORDER — ACETAMINOPHEN 325 MG PO TABS: 650.0000 mg | ORAL_TABLET | Freq: Four times a day (QID) | ORAL | Status: DC | PRN

## 2011-10-08 MED ORDER — ACETAMINOPHEN 650 MG RE SUPP: 650.0000 mg | Freq: Four times a day (QID) | RECTAL | Status: DC | PRN

## 2011-10-08 MED ORDER — ENOXAPARIN SODIUM 40 MG/0.4ML ~~LOC~~ SOLN: 40.0000 mg | SUBCUTANEOUS | Status: DC

## 2011-10-08 MED ORDER — SODIUM CHLORIDE 0.9 % IV SOLN
Freq: Once | INTRAVENOUS | Status: AC
  Administered 2011-10-08: 13:00:00 via INTRAVENOUS

## 2011-10-08 MED ORDER — DILTIAZEM HCL 100 MG IV SOLR
5.0000 mg/h | Freq: Once | INTRAVENOUS | Status: DC
  Filled 2011-10-08: qty 100

## 2011-10-08 MED ORDER — SODIUM CHLORIDE 0.9 % IJ SOLN
3.0000 mL | Freq: Two times a day (BID) | INTRAMUSCULAR | Status: DC
  Administered 2011-10-08 – 2011-10-12 (×7): 3 mL via INTRAVENOUS

## 2011-10-08 MED ORDER — BOOST PLUS PO LIQD
237.0000 mL | Freq: Three times a day (TID) | ORAL | Status: DC
  Filled 2011-10-08: qty 237

## 2011-10-08 MED ORDER — SODIUM CHLORIDE 0.9 % IV SOLN: 250.0000 mL | INTRAVENOUS | Status: DC | PRN

## 2011-10-08 MED ORDER — DILTIAZEM HCL 25 MG/5ML IV SOLN
10.0000 mg | Freq: Once | INTRAVENOUS | Status: AC
  Administered 2011-10-08: 10 mg via INTRAVENOUS
  Filled 2011-10-08: qty 5

## 2011-10-08 MED ORDER — TAMSULOSIN HCL 0.4 MG PO CAPS
0.4000 mg | ORAL_CAPSULE | Freq: Every day | ORAL | Status: DC
  Administered 2011-10-09 – 2011-10-12 (×4): 0.4 mg via ORAL
  Filled 2011-10-08 (×5): qty 1

## 2011-10-08 MED ORDER — OXYCODONE HCL 5 MG PO TABS: 5.0000 mg | ORAL_TABLET | ORAL | Status: DC | PRN

## 2011-10-08 MED ORDER — SODIUM CHLORIDE 0.9 % IJ SOLN: 3.0000 mL | INTRAMUSCULAR | Status: DC | PRN

## 2011-10-08 MED ORDER — INSULIN ASPART 100 UNIT/ML ~~LOC~~ SOLN
0.0000 [IU] | Freq: Three times a day (TID) | SUBCUTANEOUS | Status: DC
  Administered 2011-10-09: 1 [IU] via SUBCUTANEOUS
  Administered 2011-10-09: 2 [IU] via SUBCUTANEOUS
  Administered 2011-10-09: 3 [IU] via SUBCUTANEOUS
  Administered 2011-10-10 (×2): 2 [IU] via SUBCUTANEOUS
  Administered 2011-10-10: 3 [IU] via SUBCUTANEOUS
  Administered 2011-10-11: 1 [IU] via SUBCUTANEOUS
  Administered 2011-10-11: 3 [IU] via SUBCUTANEOUS
  Administered 2011-10-11: 2 [IU] via SUBCUTANEOUS
  Administered 2011-10-12: 1 [IU] via SUBCUTANEOUS
  Administered 2011-10-12: 2 [IU] via SUBCUTANEOUS

## 2011-10-08 MED ORDER — INSULIN ASPART 100 UNIT/ML ~~LOC~~ SOLN
3.0000 [IU] | Freq: Three times a day (TID) | SUBCUTANEOUS | Status: DC
  Administered 2011-10-09 – 2011-10-12 (×11): 3 [IU] via SUBCUTANEOUS

## 2011-10-08 MED ORDER — SODIUM CHLORIDE 0.9 % IJ SOLN
3.0000 mL | Freq: Two times a day (BID) | INTRAMUSCULAR | Status: DC
  Administered 2011-10-09 – 2011-10-12 (×8): 3 mL via INTRAVENOUS

## 2011-10-08 MED ORDER — ONDANSETRON HCL 4 MG PO TABS: 4.0000 mg | ORAL_TABLET | Freq: Four times a day (QID) | ORAL | Status: DC | PRN

## 2011-10-08 MED ORDER — SENNOSIDES-DOCUSATE SODIUM 8.6-50 MG PO TABS
1.0000 | ORAL_TABLET | Freq: Every evening | ORAL | Status: DC | PRN
  Filled 2011-10-08: qty 1

## 2011-10-08 MED ORDER — ASPIRIN EC 81 MG PO TBEC
81.0000 mg | DELAYED_RELEASE_TABLET | Freq: Every day | ORAL | Status: DC
  Administered 2011-10-08 – 2011-10-09 (×2): 81 mg via ORAL
  Filled 2011-10-08 (×3): qty 1

## 2011-10-08 MED ORDER — ONDANSETRON HCL 4 MG/2ML IJ SOLN: 4.0000 mg | Freq: Four times a day (QID) | INTRAMUSCULAR | Status: DC | PRN

## 2011-10-08 MED ORDER — LEVOTHYROXINE SODIUM 112 MCG PO TABS
112.0000 ug | ORAL_TABLET | Freq: Every day | ORAL | Status: DC
  Administered 2011-10-09 – 2011-10-12 (×4): 112 ug via ORAL
  Filled 2011-10-08 (×5): qty 1

## 2011-10-08 MED ORDER — DILTIAZEM HCL 30 MG PO TABS
30.0000 mg | ORAL_TABLET | Freq: Three times a day (TID) | ORAL | Status: DC
  Administered 2011-10-08 – 2011-10-09 (×2): 30 mg via ORAL
  Filled 2011-10-08 (×5): qty 1

## 2011-10-08 MED ORDER — ENOXAPARIN SODIUM 30 MG/0.3ML ~~LOC~~ SOLN
30.0000 mg | SUBCUTANEOUS | Status: DC
  Administered 2011-10-08 – 2011-10-09 (×2): 30 mg via SUBCUTANEOUS
  Filled 2011-10-08 (×3): qty 0.3

## 2011-10-08 NOTE — H&P (Signed)
PCP:   Sissy Hoff, MD, MD   Chief Complaint:  "They told me my heart was beating fast"  HPI: Patient is a very pleasant 78 year old white gentleman with a history of hypertension, diabetes, hypothyroidism, BPH who went to see his urologist and was referred from there to the emergency department with a heart rate of around 140. In the emergency department he was found to have new onset atrial fibrillation with rapid ventricular response. His history is significant for his wife dying about 6 weeks ago and he still feels significant grief about this. Also significant for the fact that his Synthroid dose was recently increased as well. He denies any chest pain or pressure, shortness of breath. We are asked to admit him for further evaluation and management.  Allergies:  No Known Allergies    Past Medical History  Diagnosis Date  . Prostate ca   . Hypothyroidism     Past Surgical History  Procedure Date  . Melanoma removed from left leg     x 14  . Left cataract extraction     Prior to Admission medications   Medication Sig Start Date End Date Taking? Authorizing Provider  hydrOXYzine (VISTARIL) 25 MG capsule Take 25 mg by mouth 3 (three) times daily as needed. Sleep   Yes Historical Provider, MD  levothyroxine (SYNTHROID, LEVOTHROID) 112 MCG tablet Take 112 mcg by mouth daily.   Yes Historical Provider, MD  Tamsulosin HCl (FLOMAX) 0.4 MG CAPS Take 0.4 mg by mouth daily.   Yes Historical Provider, MD    Social History:  reports that he has never smoked. He has never used smokeless tobacco. He reports that he does not drink alcohol or use illicit drugs.  History reviewed. No pertinent family history.  Review of Systems:  Negative except as mentioned in history of present illness.  Physical Exam: Blood pressure 140/81, pulse 115, temperature 98.2 F (36.8 C), temperature source Oral, resp. rate 24, height 5\' 10"  (1.778 m), weight 79.2 kg (174 lb 9.7 oz), SpO2 98.00%. General:  Alert, awake, oriented x3, in no distress, very pleasant and cooperative to my exam. HEENT: Normocephalic, atraumatic, pupils equal round reactive to light, intact extraocular movements, moist mucous membranes. Wears corrective lenses. Neck: Supple, no JVD, no lymphadenopathy, no bruits, no goiter. Cardiovascular: Irregularly irregular heart rhythm, currently not tachycardic, no murmurs, rubs or gallops auscultated. Lungs: Mild by basilar crackles. Abdomen: Soft, nontender, nondistended, positive bowel sounds, no masses or organomegaly noted. Extremities: No clubbing, cyanosis or edema, positive pedal pulses. Neurologic: Grossly intact and nonfocal. I have not ambulated.   Labs on Admission:  Results for orders placed during the hospital encounter of 10/08/11 (from the past 48 hour(s))  CBC     Status: Abnormal   Collection Time   10/08/11 12:00 PM      Component Value Range Comment   WBC 11.1 (*) 4.0 - 10.5 (K/uL)    RBC 4.49  4.22 - 5.81 (MIL/uL)    Hemoglobin 13.5  13.0 - 17.0 (g/dL)    HCT 16.1  09.6 - 04.5 (%)    MCV 91.3  78.0 - 100.0 (fL)    MCH 30.1  26.0 - 34.0 (pg)    MCHC 32.9  30.0 - 36.0 (g/dL)    RDW 40.9  81.1 - 91.4 (%)    Platelets 223  150 - 400 (K/uL)   DIFFERENTIAL     Status: Abnormal   Collection Time   10/08/11 12:00 PM  Component Value Range Comment   Neutrophils Relative 70  43 - 77 (%)    Neutro Abs 7.8 (*) 1.7 - 7.7 (K/uL)    Lymphocytes Relative 22  12 - 46 (%)    Lymphs Abs 2.5  0.7 - 4.0 (K/uL)    Monocytes Relative 7  3 - 12 (%)    Monocytes Absolute 0.7  0.1 - 1.0 (K/uL)    Eosinophils Relative 1  0 - 5 (%)    Eosinophils Absolute 0.1  0.0 - 0.7 (K/uL)    Basophils Relative 0  0 - 1 (%)    Basophils Absolute 0.0  0.0 - 0.1 (K/uL)   COMPREHENSIVE METABOLIC PANEL     Status: Abnormal   Collection Time   10/08/11 12:00 PM      Component Value Range Comment   Sodium 136  135 - 145 (mEq/L)    Potassium 4.7  3.5 - 5.1 (mEq/L)    Chloride 101  96  - 112 (mEq/L)    CO2 26  19 - 32 (mEq/L)    Glucose, Bld 239 (*) 70 - 99 (mg/dL)    BUN 40 (*) 6 - 23 (mg/dL)    Creatinine, Ser 8.41 (*) 0.50 - 1.35 (mg/dL)    Calcium 9.5  8.4 - 10.5 (mg/dL)    Total Protein 7.2  6.0 - 8.3 (g/dL)    Albumin 3.9  3.5 - 5.2 (g/dL)    AST 32  0 - 37 (U/L)    ALT 70 (*) 0 - 53 (U/L)    Alkaline Phosphatase 103  39 - 117 (U/L)    Total Bilirubin 0.9  0.3 - 1.2 (mg/dL)    GFR calc non Af Amer 38 (*) >90 (mL/min)    GFR calc Af Amer 43 (*) >90 (mL/min)   PRO B NATRIURETIC PEPTIDE     Status: Abnormal   Collection Time   10/08/11 12:00 PM      Component Value Range Comment   Pro B Natriuretic peptide (BNP) 8566.0 (*) 0 - 450 (pg/mL)   POCT I-STAT TROPONIN I     Status: Normal   Collection Time   10/08/11 12:23 PM      Component Value Range Comment   Troponin i, poc 0.05  0.00 - 0.08 (ng/mL)    Comment 3              Radiological Exams on Admission: Dg Chest Port 1 View  10/08/2011  *RADIOLOGY REPORT*  Clinical Data: Atrial fibrillation.  Shortness of breath.  PORTABLE CHEST - 1 VIEW  Comparison: 12/27/2007  Findings: Artifact overlies chest.  The heart is mildly enlarged. There are bilateral pleural effusions larger on the left than the right.  There is abnormal density in the lower lungs consistent with atelectasis.  Basilar pneumonia is not excluded on the left. No significant bony finding.  IMPRESSION: Bilateral effusions, larger on the left than the right.  Bibasilar volume loss left worse than right.  Cannot rule out pneumonia at the left base.  Findings probably reflect low-level congestive heart failure.  Original Report Authenticated By: Thomasenia Sales, M.D.    Assessment/Plan Principal Problem:  *New onset atrial fibrillation Active Problems:  Hypothyroidism  HTN (hypertension)  DM (diabetes mellitus)  BPH (benign prostatic hyperplasia)  Weakness generalized   #1 new onset atrial fibrillation: I suspect this may be related to his recent  increase in Synthroid dose. TSH and free T4 have been ordered. I will also check a  2-D echocardiogram to rule out any structural abnormalities of the heart. I will also check cardiac enzymes to make sure that this is not related to an acute MI, although I doubt this given his lack of anginal symptoms. We'll also check a magnesium level. Potassium is within normal limits. He has already been started on a Cardizem drip and his heart rate has decreased to the 80s although still in atrial fibrillation. Will start him on by mouth Cardizem and try to wean the drip to off. He has a CHADS score of at least 3 so he would certainly qualify for chronic anticoagulation, although I am hesitant to start this given his age, generalized weakness and fall risk. We'll await PT evaluations and further discuss with family. For now we'll place on aspirin.  #2 generalized weakness: We'll check TSH/vitamin B12/RPR. Await PT and OT recommendations.  #3 diabetes: Check A1c. Does not appear to be on any chronic medications for diabetes. Will only place on a sensitive sliding scale for now.  #4 hypertension: Has a history of this. Does not recall what medications she takes at home and his medication reconciliation only lists Flomax. Will follow for now. He has been started on Cardizem for his atrial fibrillation.  #5 DVT prophylaxis: Lovenox.  Time Spent on Admission: 60 minutes.  Chaya Jan [facility] Pager: [phone] 10/08/2011, 5:45 PM

## 2011-10-08 NOTE — Progress Notes (Signed)
Dr. Tally Joe with [name] confirmed as pcp.  CM spoke with pt and male and male family members at bedside.  Reviewed Cm consult from Admission RN for possible needs at d/c. Choice is [facility] for home health services and if snf placement needed choice is [facility] (per Male family member) pending [facility] therapy evaluation.  The list of home health agencies offered included  HOME HEALTH AGENCIES SERVING [place]   Agencies that are Medicare-Certified and are affiliated with [facility]  Telephone Number Address  [facility]   [facility] has ownership interest in this company; however, you are under no obligation to use this agency. [phone] or  [phone] [address] [place], [place] [address]   Agencies that are Medicare-Certified and are not affiliated with [facility] Telephone Number Address  [facility] [phone] Fax [phone] [address] [place], [place]  [address]  [facility] [phone] or [phone] Fax [phone] [address] [place], [place] [address]  [facility] [phone] Fax [phone] [address] [place], [place] [address]  [facility] [phone] Fax [phone] [address] [place], [place]  [address]  [facility] [phone] Fax [phone] [address] [place], [place] [address]  [facility] [phone] [address] [place], [place] [address]  [facility] [phone]  [address] [place], [place] [address]  [facility] [phone] or [phone] Fax [phone] [address] [place], [place]  [address]  [facility] [phone] Fax [phone] [address] [place], [place]  [address]  [facility]  [phone] Fax [phone] [address] [place], [place] [address]

## 2011-10-08 NOTE — ED Notes (Signed)
Pt's daughter-in-law left.  Belongings given to her with pt's wallet.  Call bell given to pt.

## 2011-10-08 NOTE — Progress Notes (Signed)
HR in 80s, po cardizem given and will titrate card gtt off as ordered. Genia Harold

## 2011-10-08 NOTE — Progress Notes (Signed)
Pt removed hearing aids. Placed hearing aids in a labeled denture cup and placed at bedside per pt request. Awaiting return call from ED in regards to pt's medical alert bracelet per day shift RN (pt unsure if it was removed in ED)

## 2011-10-08 NOTE — ED Provider Notes (Addendum)
History     CSN: [id number]  Arrival date & time 10/08/11  1131   First MD Initiated Contact with Patient 10/08/11 1159      Chief Complaint  Patient presents with  . Tachycardia    (Consider location/radiation/quality/duration/timing/severity/associated sxs/prior treatment) The history is provided by the patient and a relative.   patient here with weakness x2-3 weeks. Was seen at urology in and noted to have a heart rate of 140. He denies any chest pain chest pressure. He has not been short of breath. No vomiting or diarrhea. No black or bloody stools. No prior history of irregular heartbeat. Patient recently had his hypothyroid medication increased. No urinary symptoms currently. Nothing makes the symptoms better or worse. No medications taken for this prior to arrival  Past Medical History  Diagnosis Date  . Prostate ca     No past surgical history on file.  No family history on file.  History  Substance Use Topics  . Smoking status: Not on file  . Smokeless tobacco: Not on file  . Alcohol Use: No      Review of Systems  All other systems reviewed and are negative.    Allergies  Review of patient's allergies indicates no known allergies.  Home Medications   Current Outpatient Rx  Name Route Sig Dispense Refill  . HYDROXYZINE PAMOATE 25 MG PO CAPS Oral Take 25 mg by mouth 3 (three) times daily as needed. Sleep    . LEVOTHYROXINE SODIUM 112 MCG PO TABS Oral Take 112 mcg by mouth daily.    Marland Kitchen TAMSULOSIN HCL 0.4 MG PO CAPS Oral Take 0.4 mg by mouth daily.      BP 138/100  Pulse 144  Temp(Src) 97.8 F (36.6 C) (Oral)  Resp 23  SpO2 97%  Physical Exam  Nursing note and vitals reviewed. Constitutional: He is oriented to person, place, and time. He appears well-developed and well-nourished.  Non-toxic appearance. No distress.  HENT:  Head: Normocephalic and atraumatic.  Eyes: Conjunctivae, EOM and lids are normal. Pupils are equal, round, and reactive to  light.  Neck: Normal range of motion. Neck supple. No tracheal deviation present. No mass present.  Cardiovascular: Normal heart sounds.  An irregularly irregular rhythm present. Tachycardia present.  Exam reveals no gallop.   No murmur heard. Pulmonary/Chest: Effort normal and breath sounds normal. No stridor. No respiratory distress. He has no decreased breath sounds. He has no wheezes. He has no rhonchi. He has no rales.  Abdominal: Soft. Normal appearance and bowel sounds are normal. He exhibits no distension. There is no tenderness. There is no rebound and no CVA tenderness.  Musculoskeletal: Normal range of motion. He exhibits no edema and no tenderness.  Neurological: He is alert and oriented to person, place, and time. He has normal strength. No cranial nerve deficit or sensory deficit. GCS eye subscore is 4. GCS verbal subscore is 5. GCS motor subscore is 6.  Skin: Skin is warm and dry. No abrasion and no rash noted.  Psychiatric: He has a normal mood and affect. His speech is normal and behavior is normal.    ED Course  Procedures (including critical care time)   Labs Reviewed  CBC  DIFFERENTIAL  COMPREHENSIVE METABOLIC PANEL  T4  TSH   No results found.   No diagnosis found.    MDM   Date: 10/08/2011  Rate: 144  Rhythm: atrial fibrillation  QRS Axis: normal  Intervals: normal  ST/T Wave abnormalities: nonspecific T wave changes  Conduction Disutrbances:afin  Narrative Interpretation:   Old EKG Reviewed: changes noted  1:17 PM Patient given Cardizem 10 mg IV push and started on Cardizem drip. Patient reassessed multiple times heart rate is decreased down to the 110s. Blood pressure is stable. He does feel  better. He will be admitted by the hospitalist   CRITICAL CARE Performed by: Toy Baker   Total critical care time: 60  Critical care time was exclusive of separately billable procedures and treating other patients.  Critical care was necessary to  treat or prevent imminent or life-threatening deterioration.  Critical care was time spent personally by me on the following activities: development of treatment plan with patient and/or surrogate as well as nursing, discussions with consultants, evaluation of patient's response to treatment, examination of patient, obtaining history from patient or surrogate, ordering and performing treatments and interventions, ordering and review of laboratory studies, ordering and review of radiographic studies, pulse oximetry and re-evaluation of patient's condition.      Toy Baker, MD 10/08/11 1318  Toy Baker, MD 10/08/11 1318

## 2011-10-08 NOTE — Progress Notes (Deleted)
ED CM consulted by ACT team member to assist with cpap for d/c to [facility] facility that is unable to supply pt with a facility cpap during her stay Pt has a cpap at home she obtained from her mother after pt had sleep study completed in 2009.  Presently the mask seal comes off when pt turns on side while sleeping. CM consulted with [facility] DME staff and [facility] respiratory staff to conclude that pt would need to complete another sleep study and be fitted for her own mask and cpap but on 10/08/11 she would need to have her husband assist with bringing her mother's cpap at d/c to [facility] but [facility] may be able to assist with longer tubing. Pt and husband and ACT team member updated

## 2011-10-08 NOTE — ED Notes (Signed)
Pt sent here from urology d/t pulse 140 with HTN, pt denies chest pain, pt lives alone, states weakness since 2/13, states was placed on a antidepressant

## 2011-10-09 DIAGNOSIS — I4891 Unspecified atrial fibrillation: Secondary | ICD-10-CM

## 2011-10-09 DIAGNOSIS — E032 Hypothyroidism due to medicaments and other exogenous substances: Secondary | ICD-10-CM

## 2011-10-09 LAB — RPR: RPR Ser Ql: NONREACTIVE

## 2011-10-09 LAB — HEMOGLOBIN A1C: Mean Plasma Glucose: 212 mg/dL — ABNORMAL HIGH (ref <117)

## 2011-10-09 LAB — GLUCOSE, CAPILLARY
Glucose-Capillary: 209 mg/dL — ABNORMAL HIGH (ref 70–99)
Glucose-Capillary: 204 mg/dL — ABNORMAL HIGH (ref 70–99)
Glucose-Capillary: 137 mg/dL — ABNORMAL HIGH (ref 70–99)

## 2011-10-09 LAB — CBC
HCT: 35.8 % — ABNORMAL LOW (ref 39.0–52.0)
Hemoglobin: 11.9 g/dL — ABNORMAL LOW (ref 13.0–17.0)
RBC: 3.9 MIL/uL — ABNORMAL LOW (ref 4.22–5.81)
WBC: 12.5 10*3/uL — ABNORMAL HIGH (ref 4.0–10.5)

## 2011-10-09 LAB — BASIC METABOLIC PANEL
CO2: 25 mEq/L (ref 19–32)
Chloride: 103 mEq/L (ref 96–112)
GFR calc Af Amer: 42 mL/min — ABNORMAL LOW (ref >90)
Potassium: 4.5 mEq/L (ref 3.5–5.1)

## 2011-10-09 LAB — CARDIAC PANEL(CRET KIN+CKTOT+MB+TROPI)
CK, MB: 2.6 ng/mL (ref 0.3–4.0)
Relative Index: INVALID (ref 0.0–2.5)
Total CK: 17 U/L (ref 7–232)
Troponin I: <0.3 ng/mL (ref <0.30)

## 2011-10-09 LAB — T4, FREE: Free T4: 1.09 ng/dL (ref 0.80–1.80)

## 2011-10-09 LAB — VITAMIN B12: Vitamin B-12: 361 pg/mL (ref 211–911)

## 2011-10-09 MED ORDER — DILTIAZEM HCL 25 MG/5ML IV SOLN
15.0000 mg | Freq: Once | INTRAVENOUS | Status: AC
  Administered 2011-10-09: 15 mg via INTRAVENOUS
  Filled 2011-10-09: qty 5

## 2011-10-09 MED ORDER — DILTIAZEM HCL 60 MG PO TABS
60.0000 mg | ORAL_TABLET | Freq: Three times a day (TID) | ORAL | Status: DC
  Administered 2011-10-09 – 2011-10-12 (×8): 60 mg via ORAL
  Filled 2011-10-09 (×12): qty 1

## 2011-10-09 MED ORDER — METOPROLOL TARTRATE 1 MG/ML IV SOLN
INTRAVENOUS | Status: AC
  Filled 2011-10-09: qty 5

## 2011-10-09 MED ORDER — DILTIAZEM HCL 25 MG/5ML IV SOLN: 20.0000 mg | Freq: Once | INTRAVENOUS | Status: DC

## 2011-10-09 MED ORDER — METOPROLOL TARTRATE 1 MG/ML IV SOLN
5.0000 mg | Freq: Four times a day (QID) | INTRAVENOUS | Status: DC | PRN
  Administered 2011-10-09: 5 mg via INTRAVENOUS

## 2011-10-09 NOTE — Progress Notes (Signed)
Pt has had two nose bleeds this AM, bleeding from the Left nare. The first nose bleed was noticed when I went in to assess pt at about 0830. Pt had already gotten the bleeding to stop, but I noticed bloody tissues on pt's tray. Pt was encouraged to call for help if he had another nose bleed. Pt had a second nose bleed when he was out of bed to the bathroom. Pt's nose bled for about 15 minutes with a moderate amount of blood and several blood clots. Also while pt was OOB his HR increased to the 130-140 range. Once pt was back in bed his HR returned to 105 and BP was 128/79. Pt also had an epsisode at about 0830 this am where his HR was sustaining in the 130-140 range and BP was 154/100 at that time. About 5 minutes after HR was high it decreased on it's own to 110-120's. MD was made aware of all of these episodes at the time they occurred. No new orders received at this time, however, MD is currently assessing pt and speaking with family. Will continue to monitor pt for any further problems and he and his family are encouraged to call if pt has any more nose bleeds.   Arta Bruce [facility]  9:57 AM 10/09/2011

## 2011-10-09 NOTE — Progress Notes (Signed)
K. Black paged with pt's BP and HR after Cardizem bolus administration. Day shift RN Aware

## 2011-10-09 NOTE — Progress Notes (Signed)
Called by patient's RN with heart rates sustaining in the 140-150s. His by mouth Cardizem dose has just been increased today. Until this has time to reach a steady state, I will add some IV doses of when necessary metoprolol. If continues to be tachycardic, may benefit from addition of by mouth Lopressor in addition to by mouth Cardizem. We'll continue to follow.  Peggye Pitt, MD [facility] Pager: [phone]

## 2011-10-09 NOTE — Progress Notes (Signed)
Subjective: Lying in bed. No specific complaints, although has had a nosebleed today that has resolved by the time I have seen him. Daughter [name] is present and updated on plan of care. I have discussed with her extensively patient's current clinical condition.  Objective: Vital signs in last 24 hours: Temp:  [97.5 F (36.4 C)-98.3 F (36.8 C)] 98.3 F (36.8 C) (05/14 1339) Pulse Rate:  [75-147] 75  (05/14 1339) Resp:  [20-24] 20  (05/14 1339) BP: (125-165)/(69-106) 126/80 mmHg (05/14 1339) SpO2:  [91 %-95 %] 91 % (05/14 1339) Weight change:  Last BM Date: 10/07/11  Intake/Output from previous day: 05/13 0701 - 05/14 0700 In: 482.1 [P.O.:290; I.V.:192.1] Out: 800 [Urine:800] Total I/O In: 326 [P.O.:320; I.V.:6] Out: 225 [Urine:225]   Physical Exam: General: Alert, awake, oriented x3, in no acute distress. HEENT: No bruits, no goiter. Heart: irregular rate and rhythm, without murmurs, rubs, gallops. Lungs: Clear to auscultation bilaterally. Abdomen: Soft, nontender, nondistended, positive bowel sounds. Extremities: No clubbing cyanosis or edema with positive pedal pulses. Neuro: Grossly intact, nonfocal.    Lab Results: Basic Metabolic Panel:  Basename 10/09/11 0215 10/08/11 1200  NA 135 136  K 4.5 4.7  CL 103 101  CO2 25 26  GLUCOSE 192* 239*  BUN 40* 40*  CREATININE 1.54* 1.50*  CALCIUM 8.7 9.5  MG -- --  PHOS -- --   Liver Function Tests:  Basename 10/08/11 1200  AST 32  ALT 70*  ALKPHOS 103  BILITOT 0.9  PROT 7.2  ALBUMIN 3.9   CBC:  Basename 10/09/11 0215 10/08/11 1200  WBC 12.5* 11.1*  NEUTROABS -- 7.8*  HGB 11.9* 13.5  HCT 35.8* 41.0  MCV 91.8 91.3  PLT 181 223   Cardiac Enzymes:  Basename 10/09/11 0955 10/09/11 0215 10/08/11 1810  CKTOTAL 17 21 19   CKMB 2.6 2.7 2.6  CKMBINDEX -- -- --  TROPONINI <0.30 <0.30 <0.30   BNP:  Basename 10/08/11 1200  PROBNP 8566.0*   CBG:  Basename 10/09/11 1109 10/09/11 0735 10/09/11 0014    GLUCAP 182* 204* 209*   Hemoglobin A1C:  Basename 10/08/11 1811  HGBA1C 9.0*   Thyroid Function Tests:  Basename 10/08/11 1811 10/08/11 1200  TSH -- 3.513  T4TOTAL -- 7.6  FREET4 1.09 --  T3FREE -- --  THYROIDAB -- --   Anemia Panel:  Basename 10/08/11 1812  VITAMINB12 361  FOLATE --  FERRITIN --  TIBC --  IRON --  RETICCTPCT --   Urinalysis:  Basename 10/08/11 2010  COLORURINE YELLOW  LABSPEC 1.021  PHURINE 6.0  GLUCOSEU 250*  HGBUR NEGATIVE  BILIRUBINUR NEGATIVE  KETONESUR NEGATIVE  PROTEINUR 100*  UROBILINOGEN 0.2  NITRITE NEGATIVE  LEUKOCYTESUR NEGATIVE    Studies/Results: Dg Chest Port 1 View  10/08/2011  *RADIOLOGY REPORT*  Clinical Data: Atrial fibrillation.  Shortness of breath.  PORTABLE CHEST - 1 VIEW  Comparison: 12/27/2007  Findings: Artifact overlies chest.  The heart is mildly enlarged. There are bilateral pleural effusions larger on the left than the right.  There is abnormal density in the lower lungs consistent with atelectasis.  Basilar pneumonia is not excluded on the left. No significant bony finding.  IMPRESSION: Bilateral effusions, larger on the left than the right.  Bibasilar volume loss left worse than right.  Cannot rule out pneumonia at the left base.  Findings probably reflect low-level congestive heart failure.  Original Report Authenticated By: Thomasenia Sales, M.D.    Medications: Scheduled Meds:   . aspirin EC  81 mg Oral Daily  . diltiazem (CARDIZEM) infusion  5 mg/hr Intravenous Once  . diltiazem  15 mg Intravenous Once  . diltiazem  60 mg Oral Q8H  . enoxaparin (LOVENOX) injection  30 mg Subcutaneous Q24H  . insulin aspart  0-9 Units Subcutaneous TID WC  . insulin aspart  3 Units Subcutaneous TID WC  . levothyroxine  112 mcg Oral Daily  . sodium chloride  3 mL Intravenous Q12H  . sodium chloride  3 mL Intravenous Q12H  . Tamsulosin HCl  0.4 mg Oral Daily  . DISCONTD: diltiazem  20 mg Intravenous Once  . DISCONTD:  diltiazem  30 mg Oral Q8H  . DISCONTD: enoxaparin  40 mg Subcutaneous Q24H  . DISCONTD: lactose free nutrition  237 mL Oral TID WC   Continuous Infusions:  PRN Meds:.sodium chloride, acetaminophen, acetaminophen, ondansetron (ZOFRAN) IV, ondansetron, oxyCODONE, senna-docusate, sodium chloride  Assessment/Plan:  Principal Problem:  *New onset atrial fibrillation Active Problems:  Hypothyroidism  HTN (hypertension)  DM (diabetes mellitus)  BPH (benign prostatic hyperplasia)  Weakness generalized   #1 new onset atrial fibrillation: He was transitioned off the Cardizem drip yesterday. His heart rate is still not well controlled. I will increase his Cardizem dose. He did get a 15 mg IV Cardizem bolus early this morning. Await 2-D echo, thyroid function tests. I have discussed risk/benefit of anticoagulation extensively with patient and patient's daughter [name]. We have decided that given his advanced age, weakness and definite fall risk, that the risk of bleeding with Coumadin is higher than the risk of stroke. Patient will not be anticoagulated at this time. We'll place on a daily aspirin.  #2 generalized weakness: Await PT/OT evaluations. Vitamin B12/RPR within normal limits. Await TSH.  #3 hypertension: Well controlled.  #4 DVT prophylaxis: Lovenox.  #5 diabetes: CBGs somewhat elevated today. Follow and adjust insulin as needed.   LOS: 1 day   [facility] Pager: [phone] 10/09/2011, 3:51 PM

## 2011-10-09 NOTE — Progress Notes (Addendum)
@  0981-- Pt's heart rate increased and sustaining in the 140s at this time. Per monitor tech, it has been sustaining for approx. 15 minutes. Merdis Delay, NP notified. Order to give am dose of PO cardizem now and then follow up. Will continue to monitor.

## 2011-10-09 NOTE — Progress Notes (Signed)
PT/OT/ST Cancellation Note  ___Treatment cancelled today due to medical issues with patient which prohibited therapy  ___ Treatment cancelled today due to patient receiving procedure or test   ___ Treatment cancelled today due to patient's refusal to participate   __X_ Treatment cancelled today due to nursing reports pt's HR is elevated and requests therapy hold right now.   Signature:   Judithann Sauger OTR/L [phone] 10/09/2011  Rebeca Alert, PT [phone]

## 2011-10-09 NOTE — Progress Notes (Signed)
Pt's HR is sustaining at rate of 150 in afib. BP is 168/108 manually. Pt asymptomatic with no complaints. MD made aware and orders being written. Will administer first dose of IV metoprolol now per MD request. Will continue to monitor pt.   6:00 PM 10/09/2011 Cyndy Freeze

## 2011-10-09 NOTE — Care Management Note (Unsigned)
    Page 1 of 1   10/09/2011     12:32:47 PM   CARE MANAGEMENT NOTE 10/09/2011  Patient:  [name], [name]   Account Number:  [id number]  Date Initiated:  10/09/2011  Documentation initiated by:  Lanier Clam  Subjective/Objective Assessment:   ADMITTED W/NEW ONSET AFIB.     Action/Plan:   FROM HOME ALONE.   Anticipated DC Date:  10/13/2011   Anticipated DC Plan:  HOME W HOME HEALTH SERVICES         Choice offered to / List presented to:             Status of service:  In process, will continue to follow Medicare Important Message given?   (If response is "NO", the following Medicare IM given date fields will be blank) Date Medicare IM given:   Date Additional Medicare IM given:    Discharge Disposition:    Per UR Regulation:  Reviewed for med. necessity/level of care/duration of stay  If discussed at Long Length of Stay Meetings, dates discussed:    Comments:  10/09/11 Yuritzi Kamp RN,BSN NCM [phone] AWAIT PT/OT RECOMMENDATIONS.

## 2011-10-09 NOTE — Progress Notes (Signed)
  Echocardiogram 2D Echocardiogram has been performed.  [name] [facility] 10/09/2011, 9:08 AM

## 2011-10-09 NOTE — Progress Notes (Signed)
Glycemic Control Recommendations      Patient with history of diabetes and HgbA1C 9.0%.  Patient previously own Amaryl 4 mg daily, however current home regiment does not reflect this medication.  Please address outpatient diabetes medications to help improve glycemic control.

## 2011-10-10 ENCOUNTER — Encounter (HOSPITAL_COMMUNITY): Payer: Self-pay | Admitting: Internal Medicine

## 2011-10-10 DIAGNOSIS — N179 Acute kidney failure, unspecified: Secondary | ICD-10-CM

## 2011-10-10 DIAGNOSIS — E032 Hypothyroidism due to medicaments and other exogenous substances: Secondary | ICD-10-CM

## 2011-10-10 DIAGNOSIS — I4891 Unspecified atrial fibrillation: Secondary | ICD-10-CM

## 2011-10-10 LAB — GLUCOSE, CAPILLARY: Glucose-Capillary: 185 mg/dL — ABNORMAL HIGH (ref 70–99)

## 2011-10-10 LAB — CBC
Hemoglobin: 12 g/dL — ABNORMAL LOW (ref 13.0–17.0)
MCH: 30.8 pg (ref 26.0–34.0)
MCV: 92.3 fL (ref 78.0–100.0)
RBC: 3.89 MIL/uL — ABNORMAL LOW (ref 4.22–5.81)

## 2011-10-10 LAB — BASIC METABOLIC PANEL
CO2: 23 mEq/L (ref 19–32)
Glucose, Bld: 203 mg/dL — ABNORMAL HIGH (ref 70–99)
Potassium: 4.6 mEq/L (ref 3.5–5.1)
Sodium: 136 mEq/L (ref 135–145)

## 2011-10-10 MED ORDER — GLIMEPIRIDE 1 MG PO TABS
1.0000 mg | ORAL_TABLET | Freq: Every day | ORAL | Status: DC
  Administered 2011-10-11: 1 mg via ORAL
  Filled 2011-10-10 (×2): qty 1

## 2011-10-10 MED ORDER — OXYMETAZOLINE HCL 0.05 % NA SOLN
2.0000 | Freq: Once | NASAL | Status: AC
  Administered 2011-10-10: 2 via NASAL
  Filled 2011-10-10: qty 15

## 2011-10-10 MED ORDER — METOPROLOL TARTRATE 1 MG/ML IV SOLN
5.0000 mg | Freq: Once | INTRAVENOUS | Status: AC
  Administered 2011-10-10: 5 mg via INTRAVENOUS
  Filled 2011-10-10: qty 5

## 2011-10-10 MED ORDER — METOPROLOL TARTRATE 25 MG PO TABS
25.0000 mg | ORAL_TABLET | Freq: Two times a day (BID) | ORAL | Status: DC
  Administered 2011-10-10 (×2): 25 mg via ORAL
  Filled 2011-10-10 (×4): qty 1

## 2011-10-10 NOTE — Progress Notes (Signed)
Inpatient Diabetes Program Recommendations  AACE/ADA: New Consensus Statement on Inpatient Glycemic Control  Target Ranges:  Prepandial:   less than 140 mg/dL      Peak postprandial:   less than 180 mg/dL (1-2 hours)      Critically ill patients:  140 - 180 mg/dL  Pager:  [phone] Hours:  8 am-10pm   Reason for Visit: Elevated fasting glucose for past two days  Inpatient Diabetes Program Recommendations Insulin - Basal: If fasting glucose remains elevated, consider adding low dose basal insulin:  Lantus 10 units daily

## 2011-10-10 NOTE — Progress Notes (Signed)
[name] notified concerning patient hr is sustaining in the 140's and having  a heavy nose bleed. Patient states "He has nose bleed quite often". B/p 140/100  Hr 144 . New orders received and initated. Will continue to monitor.

## 2011-10-10 NOTE — Progress Notes (Signed)
PT Cancellation Note  Treatment cancelled today due to medical issues with patient which prohibited therapy. Pt is sustaining HR's in the 140's. RN requested to hold off treatment this AM. Will attempt evaluation this afternoon pending availability.  Thank you, 10/10/2011 Milana Kidney DPT PAGER: [phone] OFFICE: [phone]    Milana Kidney 10/10/2011, 7:59 AM

## 2011-10-10 NOTE — Progress Notes (Signed)
Subjective: Awake, alert. Denies pain/discomort. Reports persistent nosebleed yesterday but improved now. Reports generalized weakness  Objective: Vital signs Filed Vitals:   10/09/11 2125 10/10/11 0417 10/10/11 0537 10/10/11 0700  BP: 143/83 147/100 157/84 147/70  Pulse: 108 144 115   Temp: 98 F (36.7 C)  98 F (36.7 C)   TempSrc: Oral  Oral   Resp: 20  20   Height:      Weight:   78.8 kg (173 lb 11.6 oz)   SpO2: 93%  94%    Weight change: -0.4 kg (-14.1 oz) Last BM Date: 10/07/11  Intake/Output from previous day: 05/14 0701 - 05/15 0700 In: 566 [P.O.:560; I.V.:6] Out: 775 [Urine:775] Total I/O In: -  Out: 150 [Urine:150]   Physical Exam: General: Alert, awake, oriented x3, in no acute distress. HEENT: No bruits, no goiter. Heart: Irregular rate and rhythm, without murmurs, rubs, gallops. Lungs:  Normal effort. Breath sounds clear to auscultation bilaterally. No wheeze Abdomen: Soft, nontender, nondistended, positive bowel sounds. Extremities: No clubbing cyanosis or edema with positive pedal pulses. Neuro: Grossly intact, nonfocal. Speech clear    Lab Results: Basic Metabolic Panel:  Basename 10/10/11 0455 10/09/11 0215  NA 136 135  K 4.6 4.5  CL 101 103  CO2 23 25  GLUCOSE 203* 192*  BUN 41* 40*  CREATININE 1.61* 1.54*  CALCIUM 9.4 8.7  MG -- --  PHOS -- --   Liver Function Tests:  Basename 10/08/11 1200  AST 32  ALT 70*  ALKPHOS 103  BILITOT 0.9  PROT 7.2  ALBUMIN 3.9   No results found for this basename: LIPASE:2,AMYLASE:2 in the last 72 hours No results found for this basename: AMMONIA:2 in the last 72 hours CBC:  Basename 10/10/11 0455 10/09/11 0215 10/08/11 1200  WBC 11.2* 12.5* --  NEUTROABS -- -- 7.8*  HGB 12.0* 11.9* --  HCT 35.9* 35.8* --  MCV 92.3 91.8 --  PLT 195 181 --   Cardiac Enzymes:  Basename 10/09/11 0955 10/09/11 0215 10/08/11 1810  CKTOTAL 17 21 19   CKMB 2.6 2.7 2.6  CKMBINDEX -- -- --  TROPONINI <0.30 <0.30  <0.30   BNP:  Basename 10/08/11 1200  PROBNP 8566.0*   D-Dimer: No results found for this basename: DDIMER:2 in the last 72 hours CBG:  Basename 10/10/11 0737 10/09/11 2154 10/09/11 1704 10/09/11 1109 10/09/11 0735 10/09/11 0014  GLUCAP 185* 152* 137* 182* 204* 209*   Hemoglobin A1C:  Basename 10/08/11 1811  HGBA1C 9.0*   Fasting Lipid Panel: No results found for this basename: CHOL,HDL,LDLCALC,TRIG,CHOLHDL,LDLDIRECT in the last 72 hours Thyroid Function Tests:  Basename 10/08/11 1811 10/08/11 1200  TSH -- 3.513  T4TOTAL -- 7.6  FREET4 1.09 --  T3FREE -- --  THYROIDAB -- --   Anemia Panel:  Basename 10/08/11 1812  VITAMINB12 361  FOLATE --  FERRITIN --  TIBC --  IRON --  RETICCTPCT --   Coagulation: No results found for this basename: LABPROT:2,INR:2 in the last 72 hours Urine Drug Screen: Drugs of Abuse  No results found for this basename: labopia, cocainscrnur, labbenz, amphetmu, thcu, labbarb    Alcohol Level: No results found for this basename: ETH:2 in the last 72 hours Urinalysis:  Basename 10/08/11 2010  COLORURINE YELLOW  LABSPEC 1.021  PHURINE 6.0  GLUCOSEU 250*  HGBUR NEGATIVE  BILIRUBINUR NEGATIVE  KETONESUR NEGATIVE  PROTEINUR 100*  UROBILINOGEN 0.2  NITRITE NEGATIVE  LEUKOCYTESUR NEGATIVE   Misc. Labs:  No results found for this or any  previous visit (from the past 240 hour(s)).  Studies/Results: Dg Chest Port 1 View  10/08/2011  *RADIOLOGY REPORT*  Clinical Data: Atrial fibrillation.  Shortness of breath.  PORTABLE CHEST - 1 VIEW  Comparison: 12/27/2007  Findings: Artifact overlies chest.  The heart is mildly enlarged. There are bilateral pleural effusions larger on the left than the right.  There is abnormal density in the lower lungs consistent with atelectasis.  Basilar pneumonia is not excluded on the left. No significant bony finding.  IMPRESSION: Bilateral effusions, larger on the left than the right.  Bibasilar volume loss left  worse than right.  Cannot rule out pneumonia at the left base.  Findings probably reflect low-level congestive heart failure.  Original Report Authenticated By: Thomasenia Sales, M.D.    Medications: Scheduled Meds:   . aspirin EC  81 mg Oral Daily  . diltiazem  60 mg Oral Q8H  . enoxaparin (LOVENOX) injection  30 mg Subcutaneous Q24H  . insulin aspart  0-9 Units Subcutaneous TID WC  . insulin aspart  3 Units Subcutaneous TID WC  . levothyroxine  112 mcg Oral Daily  . metoprolol      . metoprolol  5 mg Intravenous Once  . metoprolol tartrate  25 mg Oral BID  . oxymetazoline  2 spray Each Nare Once  . sodium chloride  3 mL Intravenous Q12H  . sodium chloride  3 mL Intravenous Q12H  . Tamsulosin HCl  0.4 mg Oral Daily  . DISCONTD: diltiazem (CARDIZEM) infusion  5 mg/hr Intravenous Once   Continuous Infusions:  PRN Meds:.sodium chloride, acetaminophen, acetaminophen, metoprolol, ondansetron (ZOFRAN) IV, ondansetron, oxyCODONE, senna-docusate, sodium chloride  Assessment/Plan:  Principal Problem:  *New onset atrial fibrillation Active Problems:  Hypothyroidism  HTN (hypertension)  DM (diabetes mellitus)  BPH (benign prostatic hyperplasia)  Weakness generalized  #1 new onset atrial fibrillation: He was transitioned off the Cardizem drip 5/13. His heart rate is still not well controlled.  Cardizem dose increased 5/14. Will add lopressor po BID.   2-D echo yields EF 50-55% with left atrium dilatation and some pulmonary HTN., TSH 3.5 and T4 7.6.  I have discussed risk/benefit of anticoagulation extensively with patient and patient's daughter Dondra Spry. We have decided that given his advanced age, weakness and definite fall risk, that the risk of bleeding with Coumadin is higher than the risk of stroke. Patient will not be anticoagulated at this time. We'll place on a daily aspirin.  #2 generalized weakness: Await PT/OT evaluations as delayed due to #1. . Vitamin B12/RPR within normal limits.    #3 hypertension:Only fairly  controlled. Will add lopressor BID to regimen.  #4 DVT prophylaxis: Lovenox.  #5 diabetes:   hx of same. A1c 9.0 CBGs 137-185. Will add home amaryl as appetite improved.  #6 CKD: chart review indicates creatinine baseline1.5-1.7. Currently in that range. Will monitor.      LOS: 2 days   [facility] M 10/10/2011, 10:11 AM

## 2011-10-10 NOTE — Progress Notes (Signed)
Occupational Therapy Chart reviewed. Spoke with nursing who requests that OT hold on therapy this am due to HR issues. Will check back later as schedule permits. Judithann Sauger OTR/L [phone] 10/10/2011

## 2011-10-10 NOTE — Progress Notes (Signed)
OT Note:  Checked with Charity fundraiser.  While HR has decreased, pt has had 2 nosebleeds when he got up today.  Will check back tomorrow.  [place], OTR/L [phone] 10/10/2011

## 2011-10-10 NOTE — Plan of Care (Signed)
Problem: Phase I Progression Outcomes Goal: Anticoagulation Therapy per MD order Outcome: Not Applicable Date Met:  10/10/11 Has PAS on now due to nosebleeds x2 today & last night; Lovonox & Aspirin discontinued per MD  Problem: Phase III Progression Outcomes Goal: Anticoagulation Therapy per MD order Outcome: Not Applicable Date Met:  10/10/11 Has PAS hose due to nosebleed's x 2--Lovonox & Aspirin d/c'd

## 2011-10-11 DIAGNOSIS — E032 Hypothyroidism due to medicaments and other exogenous substances: Secondary | ICD-10-CM

## 2011-10-11 DIAGNOSIS — I4891 Unspecified atrial fibrillation: Secondary | ICD-10-CM

## 2011-10-11 LAB — CBC
HCT: 35.1 % — ABNORMAL LOW (ref 39.0–52.0)
MCV: 91.9 fL (ref 78.0–100.0)
RDW: 14.2 % (ref 11.5–15.5)
WBC: 11.3 10*3/uL — ABNORMAL HIGH (ref 4.0–10.5)

## 2011-10-11 LAB — GLUCOSE, CAPILLARY
Glucose-Capillary: 143 mg/dL — ABNORMAL HIGH (ref 70–99)
Glucose-Capillary: 170 mg/dL — ABNORMAL HIGH (ref 70–99)
Glucose-Capillary: 223 mg/dL — ABNORMAL HIGH (ref 70–99)
Glucose-Capillary: 152 mg/dL — ABNORMAL HIGH (ref 70–99)

## 2011-10-11 LAB — BASIC METABOLIC PANEL
BUN: 44 mg/dL — ABNORMAL HIGH (ref 6–23)
Chloride: 102 mEq/L (ref 96–112)
Creatinine, Ser: 1.76 mg/dL — ABNORMAL HIGH (ref 0.50–1.35)
GFR calc Af Amer: 36 mL/min — ABNORMAL LOW (ref >90)
Glucose, Bld: 152 mg/dL — ABNORMAL HIGH (ref 70–99)

## 2011-10-11 MED ORDER — HYDROXYZINE HCL 25 MG PO TABS
25.0000 mg | ORAL_TABLET | Freq: Three times a day (TID) | ORAL | Status: DC | PRN
  Administered 2011-10-11: 25 mg via ORAL
  Filled 2011-10-11: qty 1

## 2011-10-11 MED ORDER — INSULIN GLARGINE 100 UNIT/ML ~~LOC~~ SOLN
10.0000 [IU] | Freq: Every day | SUBCUTANEOUS | Status: DC
  Administered 2011-10-11 – 2011-10-12 (×2): 10 [IU] via SUBCUTANEOUS

## 2011-10-11 MED ORDER — DOCUSATE SODIUM 100 MG PO CAPS
100.0000 mg | ORAL_CAPSULE | Freq: Two times a day (BID) | ORAL | Status: DC
  Administered 2011-10-11 – 2011-10-12 (×3): 100 mg via ORAL
  Filled 2011-10-11 (×4): qty 1

## 2011-10-11 MED ORDER — METOPROLOL TARTRATE 50 MG PO TABS
50.0000 mg | ORAL_TABLET | Freq: Two times a day (BID) | ORAL | Status: DC
  Administered 2011-10-11 – 2011-10-12 (×3): 50 mg via ORAL
  Filled 2011-10-11 (×4): qty 1

## 2011-10-11 NOTE — Evaluation (Signed)
Occupational Therapy Evaluation Patient Details Name: [name] MRN: [id number] DOB: 1913-09-18 Today's Date: 10/11/2011 Time: 1201-1300 OT Time Calculation (min): 59 min  OT Assessment / Plan / Recommendation Clinical Impression  This 78 y.o. male admitted for new onset A-fib, renal failure.  Pt. also with history of falls weekly.  Pt. demonstrates the below listed deficits.  Feel pt. would benefit from SNF level rehab upon discharge to allow him to return home at modified independent level.  Pt. is agreeable to this plan    OT Assessment  Patient needs continued OT Services    Follow Up Recommendations  Skilled nursing facility;Supervision/Assistance - 24 hour    Barriers to Discharge Decreased caregiver support    Equipment Recommendations  Defer to next venue    Recommendations for Other Services    Frequency  Min 2X/week    Precautions / Restrictions Precautions Precautions: Fall Restrictions Weight Bearing Restrictions: No       ADL  Eating/Feeding: Performed;Independent Where Assessed - Eating/Feeding: Chair Grooming: Simulated;Wash/dry hands;Wash/dry face;Teeth care;Brushing hair;Min guard Where Assessed - Grooming: Supported standing Upper Body Bathing: Simulated;Set up Where Assessed - Upper Body Bathing: Supported sitting Lower Body Bathing: Simulated;Min guard Where Assessed - Lower Body Bathing: Supported sit to stand Upper Body Dressing: Simulated;Set up Where Assessed - Upper Body Dressing: Unsupported sitting Lower Body Dressing: Simulated;Min guard Where Assessed - Lower Body Dressing: Supported standing Toilet Transfer: Simulated;Min Pension scheme manager Method: Other (comment) (ambulate to BR) Acupuncturist: Comfort height toilet;Grab bars Toileting - Clothing Manipulation and Hygiene: Simulated;Min guard Where Assessed - Toileting Clothing Manipulation and Hygiene: Sit to stand from 3-in-1 or toilet Transfers/Ambulation Related  to ADLs: min guard assist.  Pt. rocks back on heels ADL Comments: Pt. fatigues quickly - dyspnea 2-3/4    OT Diagnosis: Generalized weakness  OT Problem List: Decreased strength;Decreased activity tolerance;Impaired balance (sitting and/or standing);Decreased knowledge of use of DME or AE;Decreased safety awareness;Cardiopulmonary status limiting activity OT Treatment Interventions:     OT Goals Acute Rehab OT Goals OT Goal Formulation: With patient Time For Goal Achievement: 10/25/11 Potential to Achieve Goals: Good ADL Goals Pt Will Perform Grooming: with supervision;Standing at sink ADL Goal: Grooming - Progress: Goal set today Pt Will Perform Lower Body Bathing: with supervision;Sit to stand from chair ADL Goal: Lower Body Bathing - Progress: Goal set today Pt Will Perform Lower Body Dressing: with supervision;Sit to stand from chair ADL Goal: Lower Body Dressing - Progress: Goal set today Pt Will Transfer to Toilet: with supervision;Ambulation;Comfort height toilet ADL Goal: Toilet Transfer - Progress: Goal set today Pt Will Perform Toileting - Clothing Manipulation: with supervision;Standing ADL Goal: Toileting - Clothing Manipulation - Progress: Goal set today Additional ADL Goal #1: Pt. will complete 30 min ADL with 1 rest break and dyspnea no greater than 2/4 ADL Goal: Additional Goal #1 - Progress: Goal set today  Visit Information  Last OT Received On: 10/11/11    Subjective Data  Subjective: "I don't think I can do much with therapy, I am weak and am short of breath.  I am here for medication, not therapy" Patient Stated Goal: "I'd like to be able to go back home eventually"   Prior Functioning  Home Living Lives With: Alone Type of Home: House Home Access: Stairs to enter [employer] of Steps: 3 Entrance Stairs-Rails: None Home Layout: One level Bathroom Shower/Tub: Forensic scientist: Standard Home Adaptive Equipment: Straight  cane;Walker - rolling Additional Comments: RW was his wife's.  Pt is agreeable to SNF for rehab Prior Function Level of Independence: Independent Able to Take Stairs?: Yes Driving: No (Stopped about 4 months ago) Vocation: Retired (retired Education officer, environmental, but still preaches ) Comments: Pt. reports falls ~ 1x/wk.  He uses a cane "sometimes".  Requires assistance with cleaning Communication Communication: HOH (bill Hearing aids) Dominant Hand: Right    Cognition  Overall Cognitive Status: Appears within functional limits for tasks assessed/performed Arousal/Alertness: Awake/alert Orientation Level: Appears intact for tasks assessed Behavior During Session: [facility] for tasks performed    Extremity/Trunk Assessment Right Upper Extremity Assessment RUE ROM/Strength/Tone: Within functional levels RUE Sensation: WFL - Light Touch RUE Coordination: WFL - gross/fine motor Left Upper Extremity Assessment LUE ROM/Strength/Tone: Within functional levels LUE Sensation: WFL - Light Touch LUE Coordination: WFL - gross/fine motor Trunk Assessment Trunk Assessment: Normal   Mobility Transfers Transfers: Sit to Stand;Stand to Sit Sit to Stand: 4: Min guard;From chair/3-in-1;With armrests;With upper extremity assist Stand to Sit: 4: Min guard;With armrests;To chair/3-in-1   Exercise    Balance    End of Session OT - End of Session Activity Tolerance: Patient limited by fatigue Patient left: in chair;with call bell/phone within reach Nurse Communication: Other (comment) (recommendation for SNF)   Anjalee Cope M 10/11/2011, 1:48 PM

## 2011-10-11 NOTE — Progress Notes (Signed)
Pt was seen and examined at bedside. I have reviewed labs and vitals which are stable and pt is hemodynamically stable. I agree with the above's assessment and plan.   Plan discharge to SNF Agree with adding Lantus  MAGICK-Kortny Lirette [facility], pager #: [phone] Main office number: [phone]

## 2011-10-11 NOTE — Progress Notes (Signed)
Subjective: Lying in bed. Denies pain/dicomfort. NAD  Objective: Vital signs Filed Vitals:   10/10/11 0700 10/10/11 1356 10/10/11 2130 10/11/11 0536  BP: 147/70 132/68 105/63 150/93  Pulse:  81 65 80  Temp:  98.1 F (36.7 C) 97.6 F (36.4 C) 97.8 F (36.6 C)  TempSrc:  Oral Oral Oral  Resp:  20 20 18   Height:      Weight:    79.017 kg (174 lb 3.2 oz)  SpO2:  96% 94% 93%   Weight change: 0.217 kg (7.6 oz) Last BM Date: 10/07/11  Intake/Output from previous day: 05/15 0701 - 05/16 0700 In: 843 [P.O.:840; I.V.:3] Out: 975 [Urine:975]     Physical Exam: General: Alert, awake, oriented x3, in no acute distress. HEENT: No bruits, no goiter. Mucus membranes moist/pink Heart: Irregular rate and rhythm, without murmurs, rubs, gallops. Lungs: Normal effort. Breath sounds clear to auscultation bilaterally. No wheeze, rales Abdomen: Soft, nontender, nondistended, positive bowel sounds. Extremities: No clubbing cyanosis or edema with positive pedal pulses. Neuro: Grossly intact, nonfocal. Speech clear    Lab Results: Basic Metabolic Panel:  Basename 10/11/11 0541 10/10/11 0455  NA 136 136  K 4.5 4.6  CL 102 101  CO2 24 23  GLUCOSE 152* 203*  BUN 44* 41*  CREATININE 1.76* 1.61*  CALCIUM 9.3 9.4  MG -- --  PHOS -- --   Liver Function Tests:  Basename 10/08/11 1200  AST 32  ALT 70*  ALKPHOS 103  BILITOT 0.9  PROT 7.2  ALBUMIN 3.9   No results found for this basename: LIPASE:2,AMYLASE:2 in the last 72 hours No results found for this basename: AMMONIA:2 in the last 72 hours CBC:  Basename 10/11/11 0541 10/10/11 0455 10/08/11 1200  WBC 11.3* 11.2* --  NEUTROABS -- -- 7.8*  HGB 11.6* 12.0* --  HCT 35.1* 35.9* --  MCV 91.9 92.3 --  PLT 206 195 --   Cardiac Enzymes:  Basename 10/09/11 0955 10/09/11 0215 10/08/11 1810  CKTOTAL 17 21 19   CKMB 2.6 2.7 2.6  CKMBINDEX -- -- --  TROPONINI <0.30 <0.30 <0.30   BNP:  Basename 10/08/11 1200  PROBNP 8566.0*    D-Dimer: No results found for this basename: DDIMER:2 in the last 72 hours CBG:  Basename 10/11/11 0734 10/10/11 2128 10/10/11 1723 10/10/11 1158 10/10/11 0737 10/09/11 2154  GLUCAP 143* 153* 151* 227* 185* 152*   Hemoglobin A1C:  Basename 10/08/11 1811  HGBA1C 9.0*   Fasting Lipid Panel: No results found for this basename: CHOL,HDL,LDLCALC,TRIG,CHOLHDL,LDLDIRECT in the last 72 hours Thyroid Function Tests:  Basename 10/08/11 1811 10/08/11 1200  TSH -- 3.513  T4TOTAL -- 7.6  FREET4 1.09 --  T3FREE -- --  THYROIDAB -- --   Anemia Panel:  Basename 10/08/11 1812  VITAMINB12 361  FOLATE --  FERRITIN --  TIBC --  IRON --  RETICCTPCT --   Coagulation: No results found for this basename: LABPROT:2,INR:2 in the last 72 hours Urine Drug Screen: Drugs of Abuse  No results found for this basename: labopia, cocainscrnur, labbenz, amphetmu, thcu, labbarb    Alcohol Level: No results found for this basename: ETH:2 in the last 72 hours Urinalysis:  Basename 10/08/11 2010  COLORURINE YELLOW  LABSPEC 1.021  PHURINE 6.0  GLUCOSEU 250*  HGBUR NEGATIVE  BILIRUBINUR NEGATIVE  KETONESUR NEGATIVE  PROTEINUR 100*  UROBILINOGEN 0.2  NITRITE NEGATIVE  LEUKOCYTESUR NEGATIVE   Misc. Labs:  No results found for this or any previous visit (from the past 240 hour(s)).  Studies/Results: No results found.  Medications: Scheduled Meds:   . diltiazem  60 mg Oral Q8H  . glimepiride  1 mg Oral Q breakfast  . insulin aspart  0-9 Units Subcutaneous TID WC  . insulin aspart  3 Units Subcutaneous TID WC  . levothyroxine  112 mcg Oral Daily  . metoprolol tartrate  50 mg Oral BID  . sodium chloride  3 mL Intravenous Q12H  . sodium chloride  3 mL Intravenous Q12H  . Tamsulosin HCl  0.4 mg Oral Daily  . DISCONTD: aspirin EC  81 mg Oral Daily  . DISCONTD: enoxaparin (LOVENOX) injection  30 mg Subcutaneous Q24H  . DISCONTD: metoprolol tartrate  25 mg Oral BID   Continuous  Infusions:  PRN Meds:.sodium chloride, acetaminophen, acetaminophen, metoprolol, ondansetron (ZOFRAN) IV, ondansetron, oxyCODONE, senna-docusate, sodium chloride  Assessment/Plan:  Principal Problem:  *New onset atrial fibrillation Active Problems:  Hypothyroidism  HTN (hypertension)  DM (diabetes mellitus)  BPH (benign prostatic hyperplasia)  Weakness generalized  Acute renal failure  #1 new onset atrial fibrillation: He was transitioned off the Cardizem drip 5/13. Rate controlled today.  2-D echo yields EF 50-55% with left atrium dilatation and some pulmonary HTN., TSH 3.5 and T4 7.6. Risk/benefit of anticoagulation extensively with patient and patient's daughter Dondra Spry. Decided that given his advanced age, weakness and definite fall risk, that the risk of bleeding with Coumadin is higher than the risk of stroke. Patient will not be anticoagulated at this time. Continue ASA.  #2 generalized weakness: Await PT/OT evaluations as delayed due to #1. . Vitamin B12/RPR within normal limits.  #3 hypertension:Only fairly controlled. Will increase lopressor .  #4 DVT prophylaxis: Lovenox.  #5 diabetes: hx of same. A1c 9.0 CBGs 143-227. Appreciate diabetes coordinator rec. Will add lantus.   #6 CKD: chart review indicates creatinine baseline1.5-1.7. Currently in that range. Will monitor.  #7Dispo: from home alone. Will get PT/OT rec and speak to daughter regarding HH vs plcment.    LOS: 3 days   [facility] M 10/11/2011, 8:45 AM

## 2011-10-11 NOTE — Evaluation (Signed)
Physical Therapy Evaluation Patient Details Name: [name] MRN: [id number] DOB: 1914-04-03 Today's Date: 10/11/2011 Time: 4098-1191 PT Time Calculation (min): 17 min  PT Assessment / Plan / Recommendation Clinical Impression  Pt presents with diagnosis of new onset Afib. Pt demonstrated general deconditioning during evaluation. Feel pt would benefit from ST rehab stay to maximize independence and safety with mobility.     PT Assessment  Patient needs continued PT services    Follow Up Recommendations  Skilled nursing facility    Barriers to Discharge        lEquipment Recommendations  Defer to next venue    Recommendations for Other Services     Frequency Min 3X/week    Precautions / Restrictions Precautions Precautions: Fall Restrictions Weight Bearing Restrictions: No   Pertinent Vitals/Pain       Mobility  Bed Mobility Bed Mobility: Sit to Supine Sit to Supine: 6: Modified independent (Device/Increase time) Transfers Transfers: Sit to Stand;Stand to Sit Sit to Stand: 4: Min assist;With upper extremity assist;With armrests;From chair/3-in-1;From toilet Stand to Sit: 4: Min assist;To bed;To toilet;With upper extremity assist Details for Transfer Assistance: x3. Assist to rise, stabilize, control descent. Increased assist needed when armrest/grabbar not available. Ambulation/Gait Ambulation/Gait Assistance: 4: Min assist Ambulation Distance (Feet): 40 Feet (in room) Assistive device: Rolling walker Ambulation/Gait Assistance Details: Fatigues fairly quickly. VCs safety, posture, distance from RW. Unsteady. Seated rest break needed. Assist to stabilize and navigate with RW.  Gait Pattern: Step-through pattern;Trunk flexed;Decreased step length - left;Decreased step length - right;Decreased stride length    Exercises     PT Diagnosis: Difficulty walking;Generalized weakness  PT Problem List: Decreased strength;Decreased activity tolerance;Decreased  mobility;Decreased balance;Decreased knowledge of use of DME PT Treatment Interventions: DME instruction;Gait training;Functional mobility training;Therapeutic activities;Therapeutic exercise;Balance training;Patient/family education   PT Goals Acute Rehab PT Goals PT Goal Formulation: With patient Time For Goal Achievement: 10/25/11 Potential to Achieve Goals: Good Pt will go Supine/Side to Sit: Independently PT Goal: Supine/Side to Sit - Progress: Goal set today Pt will go Sit to Supine/Side: Independently PT Goal: Sit to Supine/Side - Progress: Goal set today Pt will go Sit to Stand: with supervision PT Goal: Sit to Stand - Progress: Goal set today Pt will Ambulate: 51 - 150 feet;with supervision;with least restrictive assistive device PT Goal: Ambulate - Progress: Goal set today  Visit Information  Last PT Received On: 10/11/11 Assistance Needed: +1    Subjective Data  Subjective: "I don't think I could manage at home like this" Patient Stated Goal: Get better/stronger. Home   Prior Functioning  Home Living Lives With: Alone Type of Home: House Home Access: Stairs to enter Entrance Stairs-Number of Steps: 3 Entrance Stairs-Rails: None Home Layout: One level Bathroom Shower/Tub: Forensic scientist: Standard Home Adaptive Equipment: Straight cane;Walker - rolling Additional Comments: RW was his wife's.  Pt is agreeable to SNF for rehab Prior Function Level of Independence: Independent Able to Take Stairs?: Yes Driving: No (Stopped about 4 months ago) Vocation: Retired (retired Education officer, environmental, but still preaches ) Comments: Pt. reports falls ~ 1x/wk.  He uses a cane "sometimes".  Requires assistance with cleaning Communication Communication: HOH (bill Hearing aids) Dominant Hand: Right    Cognition  Overall Cognitive Status: Appears within functional limits for tasks assessed/performed Arousal/Alertness: Awake/alert Orientation Level: Appears intact for  tasks assessed Behavior During Session: [facility] for tasks performed    Extremity/Trunk Assessment Right Upper Extremity Assessment RUE ROM/Strength/Tone: Within functional levels RUE Sensation: WFL - Light Touch RUE  Coordination: WFL - gross/fine motor Left Upper Extremity Assessment LUE ROM/Strength/Tone: Within functional levels LUE Sensation: WFL - Light Touch LUE Coordination: WFL - gross/fine motor Right Lower Extremity Assessment RLE ROM/Strength/Tone: Deficits RLE ROM/Strength/Tone Deficits: Strength at least 4/5 with functional activity RLE Coordination: WFL - gross motor Left Lower Extremity Assessment LLE ROM/Strength/Tone: Deficits LLE ROM/Strength/Tone Deficits: Strength at least 4/5 with functional activity LLE Coordination: WFL - gross motor Trunk Assessment Trunk Assessment: Normal   Balance    End of Session PT - End of Session Equipment Utilized During Treatment: Gait belt Activity Tolerance: Patient limited by fatigue Patient left: in bed;with call bell/phone within reach   Rebeca Alert [facility] 10/11/2011, 3:29 PM [phone]

## 2011-10-12 DIAGNOSIS — I4891 Unspecified atrial fibrillation: Secondary | ICD-10-CM

## 2011-10-12 DIAGNOSIS — E032 Hypothyroidism due to medicaments and other exogenous substances: Secondary | ICD-10-CM

## 2011-10-12 LAB — BASIC METABOLIC PANEL
Calcium: 9.2 mg/dL (ref 8.4–10.5)
GFR calc Af Amer: 39 mL/min — ABNORMAL LOW (ref >90)
GFR calc non Af Amer: 34 mL/min — ABNORMAL LOW (ref >90)
Glucose, Bld: 139 mg/dL — ABNORMAL HIGH (ref 70–99)
Potassium: 4.6 mEq/L (ref 3.5–5.1)
Sodium: 137 mEq/L (ref 135–145)

## 2011-10-12 LAB — CBC
Hemoglobin: 11.4 g/dL — ABNORMAL LOW (ref 13.0–17.0)
MCH: 30 pg (ref 26.0–34.0)
MCHC: 32.8 g/dL (ref 30.0–36.0)
Platelets: 197 10*3/uL (ref 150–400)

## 2011-10-12 LAB — GLUCOSE, CAPILLARY: Glucose-Capillary: 121 mg/dL — ABNORMAL HIGH (ref 70–99)

## 2011-10-12 MED ORDER — SENNOSIDES-DOCUSATE SODIUM 8.6-50 MG PO TABS: 1.0000 | ORAL_TABLET | Freq: Every evening | ORAL | Status: DC | PRN

## 2011-10-12 MED ORDER — DILTIAZEM HCL ER COATED BEADS 180 MG PO CP24: 180.0000 mg | ORAL_CAPSULE | Freq: Every day | ORAL | Status: DC

## 2011-10-12 MED ORDER — ACETAMINOPHEN 325 MG PO TABS: 650.0000 mg | ORAL_TABLET | Freq: Four times a day (QID) | ORAL | Status: DC | PRN

## 2011-10-12 MED ORDER — METOPROLOL TARTRATE 50 MG PO TABS: 50.0000 mg | ORAL_TABLET | Freq: Two times a day (BID) | ORAL | Status: DC

## 2011-10-12 MED ORDER — INSULIN GLARGINE 100 UNIT/ML ~~LOC~~ SOLN: 10.0000 [IU] | Freq: Every day | SUBCUTANEOUS | Status: DC

## 2011-10-12 MED ORDER — INSULIN ASPART 100 UNIT/ML ~~LOC~~ SOLN: 0.0000 [IU] | Freq: Three times a day (TID) | SUBCUTANEOUS | Status: DC

## 2011-10-12 MED ORDER — INSULIN ASPART 100 UNIT/ML ~~LOC~~ SOLN: 3.0000 [IU] | Freq: Three times a day (TID) | SUBCUTANEOUS | Status: DC

## 2011-10-12 MED ORDER — DSS 100 MG PO CAPS: 100.0000 mg | ORAL_CAPSULE | Freq: Two times a day (BID) | ORAL | Status: AC

## 2011-10-12 NOTE — Discharge Instructions (Addendum)
Atrial Fibrillation Atrial fibrillation is an abnormal heartbeat (rhythm). It can cause your heart rate to be faster or slower than normal, and can cause clots of blood to form in your heart. These clots can cause other health problems. Atrial fibrillation may be caused by a heart attack, lung problem, or certain medicine. Sometimes the cause of atrial fibrillation is not found. HOME CARE  Take blood thinning medicine (anticoagulants) as told by your doctor. Your doctor will need to draw your blood to check lab values if you take blood thinners.   If you had a cardioversion, limit your activity as told by your doctor.   Learn how to check your heartbeat (pulse) for an abnormal or irregular beat. Your doctor can show you how.   Ask your doctor if it is okay to exercise.   Only take medicine as told by your doctor.  GET HELP RIGHT AWAY IF:   You have trouble breathing or feel dizzy.   You have puffy (swollen) feet or ankles.   You have blood in your pee (urine) or poop (bowel movement).   You feel your heart "skipping" beats.   You feel your heart "racing" or beating fast.   You have weakness in your arms or legs.   You have trouble talking, seeing, or thinking.   You have chest pain or pain in your arm or jaw.  MAKE SURE YOU:   Understand these instructions.   Will watch your condition.   Will get help right away if you are not doing well or get worse.  Document Released: 02/21/2008 Document Revised: 05/03/2011 Document Reviewed: 09/01/2009 [facility] Patient Information 2012 [place], [place].   Will need CBG checked ac and hs.

## 2011-10-12 NOTE — Progress Notes (Signed)
Clinical Social Work Department CLINICAL SOCIAL WORK PLACEMENT NOTE 10/12/2011  Patient:  [name], [name]  Account Number:  [id number] Admit date:  10/08/2011  Clinical Social Worker:  Truman Hayward, LCSW  Date/time:  10/12/2011 10:30 AM  Clinical Social Work is seeking post-discharge placement for this patient at the following level of care:   SKILLED NURSING   (*CSW will update this form in Epic as items are completed)   10/12/2011  Patient/family provided with [facility] Department of Clinical Social Work's list of facilities offering this level of care within the geographic area requested by the patient (or if unable, by the patient's family).  10/12/2011  Patient/family informed of their freedom to choose among providers that offer the needed level of care, that participate in Medicare, Medicaid or managed care program needed by the patient, have an available bed and are willing to accept the patient.  10/12/2011  Patient/family informed of [facility]' ownership interest in [facility], as well as of the fact that they are under no obligation to receive care at this facility.  PASARR submitted to EDS on 10/12/2011 PASARR number received from EDS on   FL2 transmitted to all facilities in geographic area requested by pt/family on   FL2 transmitted to all facilities within larger geographic area on   Patient informed that his/her managed care company has contracts with or will negotiate with  certain facilities, including the following:     Patient/family informed of bed offers received:  10/12/2011 Patient chooses bed at [facility] Physician recommends and patient chooses bed at    Patient to be transferred to [facility] on  10/12/2011 Patient to be transferred to facility by ambulance  The following physician request were entered in Epic:   Additional Comments:

## 2011-10-12 NOTE — Discharge Summary (Signed)
Physician Discharge Summary  Patient ID: [name] MRN: [id number] DOB/AGE: April 13, 1914 78 y.o.  Admit date: 10/08/2011 Discharge date: 10/12/2011  Primary Care Physician:  Sissy Hoff, MD, MD   Discharge Diagnoses:  New onset atrial fibrillation  Principal Problem:  *New onset atrial fibrillation Active Problems:  Hypothyroidism  HTN (hypertension)  DM (diabetes mellitus)  BPH (benign prostatic hyperplasia)  Weakness generalized  Acute renal failure   Medication List  As of 10/12/2011 12:18 PM   TAKE these medications         acetaminophen 325 MG tablet   Commonly known as: TYLENOL   Take 2 tablets (650 mg total) by mouth every 6 (six) hours as needed (or Fever >/= 101).      diltiazem 180 MG 24 hr capsule   Commonly known as: CARDIZEM CD   Take 1 capsule (180 mg total) by mouth daily.      DSS 100 MG Caps   Take 100 mg by mouth 2 (two) times daily.      hydrOXYzine 25 MG capsule   Commonly known as: VISTARIL   Take 25 mg by mouth 3 (three) times daily as needed. Sleep      insulin aspart 100 UNIT/ML injection   Commonly known as: novoLOG   Inject 3 Units into the skin 3 (three) times daily with meals.      insulin glargine 100 UNIT/ML injection   Commonly known as: LANTUS   Inject 10 Units into the skin daily.      levothyroxine 112 MCG tablet   Commonly known as: SYNTHROID, LEVOTHROID   Take 112 mcg by mouth daily.      metoprolol 50 MG tablet   Commonly known as: LOPRESSOR   Take 1 tablet (50 mg total) by mouth 2 (two) times daily.      senna-docusate 8.6-50 MG per tablet   Commonly known as: Senokot-S   Take 1 tablet by mouth at bedtime as needed.      Tamsulosin HCl 0.4 MG Caps   Commonly known as: FLOMAX   Take 0.4 mg by mouth daily.            Disposition and Follow-up: pt medically stable and ready for discharge to facility. Will need follow up with PCP 1 week to evaluate BP, Glucose, insulin.   Consults:  None   Significant  Diagnostic Studies:   Dg Chest Port 1 View 10/08/2011    IMPRESSION:  Bilateral effusions, larger on the left than the right.   Bibasilar volume loss left worse than right.  Cannot rule out pneumonia at the left base.   Findings probably reflect low-level congestive heart failure.    Brief H and P: For complete details please refer to admission H and P, but in brief  Patient is a very pleasant 78 year old white gentleman with a history of hypertension, diabetes, hypothyroidism, BPH who went to see his urologist and was referred from there to the emergency department on 10/08/11 with a heart rate of around 140. In the emergency department he was found to have new onset atrial fibrillation with rapid ventricular response. His history is significant for his wife dying about 6 weeks ago and he still feels significant grief about this. Also significant for the fact that his Synthroid dose was recently increased as well. He denied any chest pain or pressure, shortness of breath. He was admitted to medicine service.  Physical Exam:  General: Alert, awake, oriented x3, in no acute distress. HEENT: No  bruits, no goiter. Moist mucous membranes, no scleral icterus, no conjunctival pallor. Heart: Irregular rate and rhythm, S1/S2 +, no murmurs, rubs, gallops. Lungs: Clear to auscultation bilaterally but slightly decreased at bases. No wheezing, no rhonchi, no rales.  Abdomen: Soft, nontender, nondistended, positive bowel sounds. Extremities: No clubbing or cyanosis, no pitting edema,  positive pedal pulses. Neuro: Grossly nonfocal.  Lab Results:  Lab 10/12/11 0500 10/11/11 0541 10/10/11 0455 10/09/11 0215 10/08/11 1200  WBC 12.1* 11.3* 11.2* 12.5* 11.1*  HGB 11.4* 11.6* 12.0* 11.9* 13.5  HCT 34.8* 35.1* 35.9* 35.8* 41.0  PLT 197 206 195 181 223   Lab 10/12/11 0500 10/11/11 0541 10/10/11 0455 10/09/11 0215 10/08/11 1200  NA 137 136 136 135 136  K 4.6 4.5 4.6 4.5 4.7  CL 102 102 101 103 101  CO2  24 24 23 25 26   GLUCOSE 139* 152* 203* 192* 239*  BUN 46* 44* 41* 40* 40*  CREATININE 1.63* 1.76* 1.61* 1.54* 1.50*  CALCIUM 9.2 9.3 9.4 8.7 9.5   Cardiac markers:  Lab 10/09/11 0955 10/09/11 0215 10/08/11 1810  CKMB 2.6 2.7 2.6  TROPONINI <0.30 <0.30 <0.30  MYOGLOBIN -- -- --   Hospital Course:   #1 new onset atrial fibrillation:  - Admitted to telemetry. Cardizem drip started tin ED and transitioned off the Cardizem drip 5/14.  - Rate controlled by 5/15 and remained controlled during hospitalization.  - 2-D echo yields EF 50-55% with left atrium dilatation and some pulmonary HTN., TSH 3.5 and T4 7.6.  - Risk/benefit of anticoagulation extensively discussed with patient and patient's daughter Dondra Spry.  - Decided that given his advanced age, weakness and definite fall risk, that the risk of bleeding with Coumadin is higher than the risk of stroke.  - Patient will not be anticoagulated at this time. Continue ASA.   #2 generalized weakness:  - PT/OT Recommend skilled nursing facility.   - Vitamin B12/RPR within normal limits. Somewhat improved at discharge.  - Will continue to work with PT at facility.   #3 hypertension: - Controlled. Will continue current regimen .    #5 diabetes:  - hx of same. A1c 9.0 CBGs 143-227. lantus added to meal coverage and SSI.  - Will follow up with PCP 1 week for evaluation of therapy and control of DM.  - Will need CBG checked AC and HS at facility.  #6 CKD:  - chart review indicates creatinine baseline1.5-1.7. Initially given iv fluids.  - Creatinine trended downward. At discharge at baseline.    Disposition - plan of care and diagnosis, diagnostic studies and test results were discussed with pt - pt verbalized understanding  Time spent on Discharge: 40 min  Signed: Gwenyth Bender 10/12/2011, 12:18 PM  MAGICK-Ona Roehrs  [facility], pager #: [phone] Main office number: [phone]

## 2011-10-12 NOTE — Progress Notes (Signed)
Occupational Therapy Treatment Patient Details Name: [name] MRN: [id number] DOB: Nov 27, 1913 Today's Date: 10/12/2011 Time: 4098-1191 OT Time Calculation (min): 13 min  OT Assessment / Plan / Recommendation Comments on Treatment Session Pt. continues to fatigue easily.  He is looking forward to rehab    Follow Up Recommendations  Skilled nursing facility;Supervision/Assistance - 24 hour    Barriers to Discharge       Equipment Recommendations  Defer to next venue    Recommendations for Other Services    Frequency Min 2X/week   Plan Discharge plan remains appropriate    Precautions / Restrictions Precautions Precautions: Fall Restrictions Weight Bearing Restrictions: No       ADL  Grooming: Performed;Wash/dry hands;Wash/dry face;Teeth care;Min guard Where Assessed - Grooming: Supported standing Toilet Transfer: Performed;Min Pension scheme manager Method: Sit to stand (ambulated to [employer]) Acupuncturist: Comfort height toilet;Grab bars Toileting - Clothing Manipulation and Hygiene: Performed;Min guard Where Assessed - Glass blower/designer Manipulation and Hygiene: Standing Transfers/Ambulation Related to ADLs: min guard assist.  Pt. furniture walks ADL Comments: Pt. with dyspnea 2-3/4    OT Diagnosis:    OT Problem List:   OT Treatment Interventions:     OT Goals Acute Rehab OT Goals Time For Goal Achievement: 10/25/11 ADL Goals ADL Goal: Grooming - Progress: Progressing toward goals ADL Goal: Toilet Transfer - Progress: Progressing toward goals ADL Goal: Additional Goal #1 - Progress: Progressing toward goals  Visit Information  Last OT Received On: 10/12/11 Assistance Needed: +1    Subjective Data      Prior Functioning       Cognition  Overall Cognitive Status: Appears within functional limits for tasks assessed/performed Arousal/Alertness: Awake/alert Orientation Level: Appears intact for tasks assessed Behavior During Session: [facility] for  tasks performed    Mobility Bed Mobility Bed Mobility: Sit to Supine;Supine to Sit Supine to Sit: 6: Modified independent (Device/Increase time);With rails Sit to Supine: 6: Modified independent (Device/Increase time) Transfers Transfers: Sit to Stand;Stand to Sit Sit to Stand: 4: Min guard;With upper extremity assist;From bed;From toilet Stand to Sit: 4: Min guard;With upper extremity assist;To bed;To toilet   Exercises    Balance    End of Session OT - End of Session Activity Tolerance: Patient limited by fatigue Patient left: in bed;with call bell/phone within reach;with bed alarm set   Emelynn Rance, Ursula Alert M 10/12/2011, 12:04 PM

## 2011-10-12 NOTE — Progress Notes (Signed)
PT Cancellation Note  Treatment cancelled today due to patient's refusal to participate.  Pt declined therapy because he wishes to eat lunch first (reports ordered 15 min ago).  Pt also with d/c plans for SNF today.  Aimar Borghi,KATHrine E 10/12/2011, 1:57 PM Pager: [phone]

## 2011-10-12 NOTE — Progress Notes (Signed)
Clinical Social Work Department BRIEF PSYCHOSOCIAL ASSESSMENT 10/12/2011  Patient:  [name], [name]     Account Number:  [id number]     Admit date:  10/08/2011  Clinical Social Worker:  Doreene Nest  Date/Time:  10/12/2011 10:30 AM  Referred by:  Physician  Date Referred:  10/12/2011 Referred for  SNF Placement   Other Referral:   Interview type:  Family Other interview type:    PSYCHOSOCIAL DATA Living Status:  ALONE Admitted from facility:   Level of care:   Primary support name:  Denton Ar Primary support relationship to patient:  CHILD, ADULT Degree of support available:   good    CURRENT CONCERNS Current Concerns  Post-Acute Placement   Other Concerns:    SOCIAL WORK ASSESSMENT / PLAN CSW spoke with pt's son and [facility].  Plan for pt to d/c to SNF today.  CSW will continue to follow to assist with d/c planning today.   Assessment/plan status:  Psychosocial Support/Ongoing Assessment of Needs Other assessment/ plan:   Information/referral to community resources:    PATIENT'S/FAMILY'S RESPONSE TO PLAN OF CARE: Pt's son is familiar with SNF process and was thankful CSW contacted him to assist, and expressed he is available to help with paperwork this afternoon.

## 2011-10-12 NOTE — Progress Notes (Deleted)
Subjective: Lying in bed eyes closed. Easily aroused. Denies pain/discomfort. Reports "i cant care for myself so i'll have to make other arrangements."  Objective: Vital signs Filed Vitals:   10/11/11 1340 10/11/11 2056 10/12/11 0005 10/12/11 0345  BP: 118/64 126/77 121/72 123/65  Pulse: 63 69 61 68  Temp: 96.9 F (36.1 C) 98 F (36.7 C) 97.3 F (36.3 C) 97.5 F (36.4 C)  TempSrc: Axillary Oral Oral Oral  Resp: 18 18 18 16   Height:      Weight:    79.6 kg (175 lb 7.8 oz)  SpO2: 97% 94% 92% 93%   Weight change: 0.583 kg (1 lb 4.6 oz) Last BM Date: 10/11/11  Intake/Output from previous day: 05/16 0701 - 05/17 0700 In: 440 [P.O.:440] Out: 475 [Urine:475]     Physical Exam: General: Alert, awake, oriented x3, in no acute distress. HEENT: No bruits, no goiter. Mucus membranes slightly dry/pink Heart: Iregular rhythm, without murmurs, rubs, gallops. Lungs: Normal effort. Breath sounds clear to auscultation bilaterally. No wheeze Abdomen: Soft, nontender, nondistended, positive bowel sounds. Extremities: No clubbing cyanosis or edema with positive pedal pulses. Neuro: Grossly intact, nonfocal.    Lab Results: Basic Metabolic Panel:  Basename 10/12/11 0500 10/11/11 0541  NA 137 136  K 4.6 4.5  CL 102 102  CO2 24 24  GLUCOSE 139* 152*  BUN 46* 44*  CREATININE 1.63* 1.76*  CALCIUM 9.2 9.3  MG -- --  PHOS -- --   Liver Function Tests: No results found for this basename: AST:2,ALT:2,ALKPHOS:2,BILITOT:2,PROT:2,ALBUMIN:2 in the last 72 hours No results found for this basename: LIPASE:2,AMYLASE:2 in the last 72 hours No results found for this basename: AMMONIA:2 in the last 72 hours CBC:  Basename 10/12/11 0500 10/11/11 0541  WBC 12.1* 11.3*  NEUTROABS -- --  HGB 11.4* 11.6*  HCT 34.8* 35.1*  MCV 91.6 91.9  PLT 197 206   Cardiac Enzymes:  Basename 10/09/11 0955  CKTOTAL 17  CKMB 2.6  CKMBINDEX --  TROPONINI <0.30   BNP: No results found for this  basename: PROBNP:3 in the last 72 hours D-Dimer: No results found for this basename: DDIMER:2 in the last 72 hours CBG:  Basename 10/12/11 0756 10/11/11 2053 10/11/11 1646 10/11/11 1121 10/11/11 0734 10/10/11 2128  GLUCAP 121* 152* 223* 170* 143* 153*   Hemoglobin A1C: No results found for this basename: HGBA1C in the last 72 hours Fasting Lipid Panel: No results found for this basename: CHOL,HDL,LDLCALC,TRIG,CHOLHDL,LDLDIRECT in the last 72 hours Thyroid Function Tests: No results found for this basename: TSH,T4TOTAL,FREET4,T3FREE,THYROIDAB in the last 72 hours Anemia Panel: No results found for this basename: VITAMINB12,FOLATE,FERRITIN,TIBC,IRON,RETICCTPCT in the last 72 hours Coagulation: No results found for this basename: LABPROT:2,INR:2 in the last 72 hours Urine Drug Screen: Drugs of Abuse  No results found for this basename: labopia, cocainscrnur, labbenz, amphetmu, thcu, labbarb    Alcohol Level: No results found for this basename: ETH:2 in the last 72 hours Urinalysis: No results found for this basename: COLORURINE:2,APPERANCEUR:2,LABSPEC:2,PHURINE:2,GLUCOSEU:2,HGBUR:2,BILIRUBINUR:2,KETONESUR:2,PROTEINUR:2,UROBILINOGEN:2,NITRITE:2,LEUKOCYTESUR:2 in the last 72 hours Misc. Labs:  No results found for this or any previous visit (from the past 240 hour(s)).  Studies/Results: No results found.  Medications: Scheduled Meds:   . diltiazem  60 mg Oral Q8H  . docusate sodium  100 mg Oral BID  . insulin aspart  0-9 Units Subcutaneous TID WC  . insulin aspart  3 Units Subcutaneous TID WC  . insulin glargine  10 Units Subcutaneous Daily  . levothyroxine  112 mcg Oral Daily  .  metoprolol tartrate  50 mg Oral BID  . sodium chloride  3 mL Intravenous Q12H  . sodium chloride  3 mL Intravenous Q12H  . Tamsulosin HCl  0.4 mg Oral Daily  . DISCONTD: glimepiride  1 mg Oral Q breakfast   Continuous Infusions:  PRN Meds:.sodium chloride, acetaminophen, acetaminophen,  hydrOXYzine, ondansetron (ZOFRAN) IV, ondansetron, oxyCODONE, senna-docusate, sodium chloride, DISCONTD: metoprolol  Assessment/Plan:  Principal Problem:  *New onset atrial fibrillation Active Problems:  Hypothyroidism  HTN (hypertension)  DM (diabetes mellitus)  BPH (benign prostatic hyperplasia)  Weakness generalized  Acute renal failure #1 new onset atrial fibrillation: He was transitioned off the Cardizem drip 5/13. Rate controlled today. 2-D echo yields EF 50-55% with left atrium dilatation and some pulmonary HTN., TSH 3.5 and T4 7.6. Risk/benefit of anticoagulation extensively with patient and patient's daughter Dondra Spry. Decided that given his advanced age, weakness and definite fall risk, that the risk of bleeding with Coumadin is higher than the risk of stroke. Patient will not be anticoagulated at this time. Continue ASA.  #2 generalized weakness:  PT/OT  Recommend skilled nursing facility.  Some improvement today. Will request SW eval for plcment. Vitamin B12/RPR within normal limits.  #3 hypertension:Controlled. Will continue current regimen .  #4 DVT prophylaxis: Lovenox.  #5 diabetes: hx of same. A1c 9.0 CBGs 143-227. Appreciate diabetes coordinator rec. Will add lantus.  #6 CKD: chart review indicates creatinine baseline1.5-1.7. Currently in that range. Will monitor.  Dispo: pt medically stable and ready for discharge. Will request SW consult and have called family to see if [facility] option.     LOS: 4 days   [facility] M 10/12/2011, 9:21 AM

## 2011-12-06 ENCOUNTER — Inpatient Hospital Stay (HOSPITAL_COMMUNITY)
Admission: EM | Admit: 2011-12-06 | Discharge: 2011-12-10 | DRG: 194 | Disposition: A | Discharge status: Nursing Home (Includes ICF) | Payer: Medicare Other | Attending: Family Medicine | Admitting: Family Medicine

## 2011-12-06 ENCOUNTER — Encounter (HOSPITAL_COMMUNITY): Payer: Self-pay | Admitting: Family Medicine

## 2011-12-06 DIAGNOSIS — Z66 Do not resuscitate: Secondary | ICD-10-CM | POA: Diagnosis present

## 2011-12-06 DIAGNOSIS — R627 Adult failure to thrive: Secondary | ICD-10-CM | POA: Diagnosis present

## 2011-12-06 DIAGNOSIS — N400 Enlarged prostate without lower urinary tract symptoms: Secondary | ICD-10-CM

## 2011-12-06 DIAGNOSIS — I10 Essential (primary) hypertension: Secondary | ICD-10-CM

## 2011-12-06 DIAGNOSIS — J189 Pneumonia, unspecified organism: Principal | ICD-10-CM

## 2011-12-06 DIAGNOSIS — IMO0002 Reserved for concepts with insufficient information to code with codable children: Secondary | ICD-10-CM | POA: Diagnosis present

## 2011-12-06 DIAGNOSIS — R451 Restlessness and agitation: Secondary | ICD-10-CM

## 2011-12-06 DIAGNOSIS — N179 Acute kidney failure, unspecified: Secondary | ICD-10-CM

## 2011-12-06 DIAGNOSIS — R52 Pain, unspecified: Secondary | ICD-10-CM

## 2011-12-06 DIAGNOSIS — C61 Malignant neoplasm of prostate: Secondary | ICD-10-CM

## 2011-12-06 DIAGNOSIS — F05 Delirium due to known physiological condition: Secondary | ICD-10-CM | POA: Diagnosis present

## 2011-12-06 DIAGNOSIS — N451 Epididymitis: Secondary | ICD-10-CM | POA: Diagnosis present

## 2011-12-06 DIAGNOSIS — R410 Disorientation, unspecified: Secondary | ICD-10-CM

## 2011-12-06 DIAGNOSIS — R269 Unspecified abnormalities of gait and mobility: Secondary | ICD-10-CM

## 2011-12-06 DIAGNOSIS — R29898 Other symptoms and signs involving the musculoskeletal system: Secondary | ICD-10-CM

## 2011-12-06 DIAGNOSIS — R531 Weakness: Secondary | ICD-10-CM

## 2011-12-06 DIAGNOSIS — C7951 Secondary malignant neoplasm of bone: Secondary | ICD-10-CM | POA: Diagnosis present

## 2011-12-06 DIAGNOSIS — I4892 Unspecified atrial flutter: Secondary | ICD-10-CM

## 2011-12-06 DIAGNOSIS — E039 Hypothyroidism, unspecified: Secondary | ICD-10-CM

## 2011-12-06 DIAGNOSIS — R4182 Altered mental status, unspecified: Secondary | ICD-10-CM

## 2011-12-06 DIAGNOSIS — R5381 Other malaise: Secondary | ICD-10-CM | POA: Diagnosis present

## 2011-12-06 DIAGNOSIS — E119 Type 2 diabetes mellitus without complications: Secondary | ICD-10-CM

## 2011-12-06 HISTORY — DX: Malignant neoplasm of prostate: C61

## 2011-12-06 LAB — COMPREHENSIVE METABOLIC PANEL
ALT: 21 U/L (ref 0–53)
AST: 19 U/L (ref 0–37)
Albumin: 2.9 g/dL — ABNORMAL LOW (ref 3.5–5.2)
Alkaline Phosphatase: 118 U/L — ABNORMAL HIGH (ref 39–117)
CO2: 26 mEq/L (ref 19–32)
Chloride: 101 mEq/L (ref 96–112)
Creatinine, Ser: 1.29 mg/dL (ref 0.50–1.35)
GFR calc non Af Amer: 45 mL/min — ABNORMAL LOW (ref >90)
Potassium: 5 mEq/L (ref 3.5–5.1)
Total Bilirubin: 0.3 mg/dL (ref 0.3–1.2)

## 2011-12-06 LAB — URINALYSIS, ROUTINE W REFLEX MICROSCOPIC
Bilirubin Urine: NEGATIVE
Glucose, UA: 500 mg/dL — AB
Hgb urine dipstick: NEGATIVE
Ketones, ur: NEGATIVE mg/dL
Protein, ur: 30 mg/dL — AB
Urobilinogen, UA: 0.2 mg/dL (ref 0.0–1.0)

## 2011-12-06 LAB — CBC
MCV: 89.6 fL (ref 78.0–100.0)
Platelets: 192 10*3/uL (ref 150–400)
RBC: 4.02 MIL/uL — ABNORMAL LOW (ref 4.22–5.81)
RDW: 14.1 % (ref 11.5–15.5)
WBC: 11.7 10*3/uL — ABNORMAL HIGH (ref 4.0–10.5)

## 2011-12-06 LAB — GLUCOSE, CAPILLARY

## 2011-12-06 LAB — URINE MICROSCOPIC-ADD ON

## 2011-12-06 MED ORDER — HYDROXYZINE PAMOATE 25 MG PO CAPS: 25.0000 mg | ORAL_CAPSULE | Freq: Three times a day (TID) | ORAL | Status: DC | PRN

## 2011-12-06 MED ORDER — LORAZEPAM 0.5 MG PO TABS
0.5000 mg | ORAL_TABLET | Freq: Three times a day (TID) | ORAL | Status: DC
  Administered 2011-12-06 – 2011-12-09 (×9): 0.5 mg via ORAL
  Filled 2011-12-06 (×9): qty 1

## 2011-12-06 MED ORDER — HYDROXYZINE HCL 25 MG PO TABS
25.0000 mg | ORAL_TABLET | Freq: Three times a day (TID) | ORAL | Status: DC | PRN
  Administered 2011-12-09 – 2011-12-10 (×2): 25 mg via ORAL
  Filled 2011-12-06 (×2): qty 1

## 2011-12-06 MED ORDER — SODIUM CHLORIDE 0.9 % IV SOLN
Freq: Once | INTRAVENOUS | Status: AC
  Administered 2011-12-06: 23:00:00 via INTRAVENOUS

## 2011-12-06 MED ORDER — INSULIN GLARGINE 100 UNIT/ML ~~LOC~~ SOLN
10.0000 [IU] | Freq: Every day | SUBCUTANEOUS | Status: DC
  Administered 2011-12-06: 23:00:00 via SUBCUTANEOUS
  Administered 2011-12-07 – 2011-12-10 (×4): 10 [IU] via SUBCUTANEOUS
  Filled 2011-12-06: qty 1

## 2011-12-06 NOTE — ED Notes (Signed)
YNW:[id number]<[id number]> Expected date:12/06/11<BR> Expected time: 5:23 PM<BR> Means of arrival:Ambulance<BR> Comments:<BR> Leg edema/aloc

## 2011-12-06 NOTE — ED Provider Notes (Signed)
History     CSN: [id number]  Arrival date & time 12/06/11  1719   First MD Initiated Contact with Patient 12/06/11 1900      Chief Complaint  Patient presents with  . Altered Mental Status  . Leg Swelling    (Consider location/radiation/quality/duration/timing/severity/associated sxs/prior treatment) HPI  78 year old previously stable man living at home who presents with a new onset visual hallucinations and gait instability. He is accompanied  byTyra Andria Meuse, his home health nurse. He is also with his daughter Rayhaan Huster. His home nurse Tyra, notes that he has seen animals today that were not present. This has never occurred before. He also has generalized weakness and gait instability x 1 day. He normally walks with a cane. He did not have any falls.   Patient Active Problem List  Diagnosis  . Atrial flutter  . Hypothyroidism  . HTN (hypertension)  . DM (diabetes mellitus)  . BPH (benign prostatic hyperplasia)  . Weakness generalized  . Acute renal failure  . Prostate cancer, primary, with metastasis from prostate to other site     Past Medical History  Diagnosis Date  . Prostate ca   . Hypothyroidism   . Hyperglycemia   . DMII (diabetes mellitus, type 2)   . Prostate cancer, primary, with metastasis from prostate to other site 12/06/2011    Past Surgical History  Procedure Date  . Melanoma removed from left leg 1971    x 14  . Left cataract extraction     History reviewed. No pertinent family history.  History  Substance Use Topics  . Smoking status: Never Smoker   . Smokeless tobacco: Never Used  . Alcohol Use: No     Review of Systems  Constitutional: Positive for activity change.  HENT: Positive for hearing loss.   Eyes: Negative.   Respiratory: Negative.   Cardiovascular: Positive for leg swelling.  Gastrointestinal: Negative.   Genitourinary: Negative for dysuria.  Musculoskeletal: Positive for gait problem. Negative for myalgias, back  pain, joint swelling and arthralgias.  Neurological: Negative.   Hematological: Negative.   Psychiatric/Behavioral: Positive for hallucinations. Negative for behavioral problems and agitation.    Allergies  Accupril and Naproxen  Home Medications   Current Outpatient Rx  Name Route Sig Dispense Refill  . ACETAMINOPHEN 325 MG PO TABS Oral Take 2 tablets (650 mg total) by mouth every 6 (six) hours as needed (or Fever >/= 101).    . ASPIRIN 81 MG PO TABS Oral Take 81 mg by mouth daily.    Marland Kitchen VITAMIN D3 1000 UNITS PO CHEW Oral Chew 2 tablets by mouth daily.    Marland Kitchen DILTIAZEM HCL ER COATED BEADS 180 MG PO CP24 Oral Take 1 capsule (180 mg total) by mouth daily.    Marland Kitchen HYDROXYZINE PAMOATE 25 MG PO CAPS Oral Take 25 mg by mouth 3 (three) times daily as needed. Sleep    . INSULIN GLARGINE 100 UNIT/ML [place] SOLN Subcutaneous Inject 10 Units into the skin daily. 10 mL   . INSULIN LISPRO (HUMAN) 100 UNIT/ML [place] SOLN Subcutaneous Inject 3 Units into the skin 3 (three) times daily before meals.    Marland Kitchen LEVOTHYROXINE SODIUM 112 MCG PO TABS Oral Take 112 mcg by mouth daily.    Marland Kitchen LORAZEPAM 0.5 MG PO TABS Oral Take 0.5 mg by mouth every 8 (eight) hours. Sleeping    . METOPROLOL TARTRATE 50 MG PO TABS Oral Take 1 tablet (50 mg total) by mouth 2 (two) times daily.    Marland Kitchen  SENNOSIDES-DOCUSATE SODIUM 8.6-50 MG PO TABS Oral Take 1 tablet by mouth at bedtime as needed.    Marland Kitchen TAMSULOSIN HCL 0.4 MG PO CAPS Oral Take 0.4 mg by mouth daily.      BP 153/70  Pulse 69  Temp 99 F (37.2 C) (Oral)  Resp 16  SpO2 96%  Physical Exam  Constitutional: Vital signs are normal. He appears well-developed and well-nourished. He does not appear ill. No distress.       Elderly WM, very pleasant and conversant  Cardiovascular: Normal rate and normal pulses.  An irregular rhythm present.  Pulmonary/Chest: Effort normal and breath sounds normal.  Musculoskeletal:       Right lower leg: He exhibits swelling, edema and deformity.       Left  lower leg: He exhibits swelling and edema. He exhibits no tenderness, no bony tenderness and no deformity.  Neurological: He is alert.  Skin: He is not diaphoretic.       Numerous seborrheic keratosis on trunk and neck    ED Course  Procedures (including critical care time)  Labs Reviewed  GLUCOSE, CAPILLARY - Abnormal; Notable for the following:    Glucose-Capillary 309 (*)     All other components within normal limits  CBC - Abnormal; Notable for the following:    WBC 11.7 (*)     RBC 4.02 (*)     Hemoglobin 12.1 (*)     HCT 36.0 (*)     All other components within normal limits  COMPREHENSIVE METABOLIC PANEL - Abnormal; Notable for the following:    Sodium 134 (*)     Glucose, Bld 266 (*)     BUN 39 (*)     Total Protein 5.9 (*)     Albumin 2.9 (*)     Alkaline Phosphatase 118 (*)     GFR calc non Af Amer 45 (*)     GFR calc Af Amer 52 (*)     All other components within normal limits  URINALYSIS, ROUTINE W REFLEX MICROSCOPIC - Abnormal; Notable for the following:    APPearance CLOUDY (*)     Glucose, UA 500 (*)     Protein, ur 30 (*)     All other components within normal limits  URINE MICROSCOPIC-ADD ON   No results found.   1. Weakness of both legs      Date: 12/06/2011  Rate: 69  Rhythm: atrial flutter  QRS Axis: normal  Intervals: PR indeterminate  ST/T Wave abnormalities: normal  Conduction Disutrbances:none  Narrative Interpretation: persistent atrial flutter  Old EKG Reviewed: unchanged    MDM  78 year old man who presented with acute onset visual hallucinations that resolved prior to evaluations. The lab tests provided no indication for the cause this problem, but it is likely multifactorial and not something we will uncover. Fortunately, these hallucinations have resolved. The most concerning problem now is the subacute onset weakness, such that the patient cannot carry out ADL's without assistance. He lives alone with nursing coverage during the  day. He cannot be discharged tonight, because he doesn't have any assistance at home. Therefore, we recommend that he be evaluated for observation and have a PT consultation.

## 2011-12-06 NOTE — ED Notes (Signed)
RN to obtain labs with start of IV 

## 2011-12-06 NOTE — ED Notes (Signed)
Per EMS: Pt was brought home by family over the past couple weeks from [place]. Reports altered mental status, increasing difficulty ambulating, and hallucinations x 6 months.  States it has been worse over the past couple weeks.  Edema, redness noted to right lower extremity.  Pt has been calm and cooperative with EMS. Pt reports feeling weak and not feeling well.

## 2011-12-07 ENCOUNTER — Encounter (HOSPITAL_COMMUNITY): Payer: Self-pay | Admitting: *Deleted

## 2011-12-07 ENCOUNTER — Observation Stay (HOSPITAL_COMMUNITY): Payer: Medicare Other

## 2011-12-07 DIAGNOSIS — R4182 Altered mental status, unspecified: Secondary | ICD-10-CM | POA: Diagnosis present

## 2011-12-07 DIAGNOSIS — I4892 Unspecified atrial flutter: Secondary | ICD-10-CM

## 2011-12-07 DIAGNOSIS — R269 Unspecified abnormalities of gait and mobility: Secondary | ICD-10-CM | POA: Diagnosis present

## 2011-12-07 DIAGNOSIS — R29898 Other symptoms and signs involving the musculoskeletal system: Secondary | ICD-10-CM

## 2011-12-07 DIAGNOSIS — E119 Type 2 diabetes mellitus without complications: Secondary | ICD-10-CM

## 2011-12-07 DIAGNOSIS — N179 Acute kidney failure, unspecified: Secondary | ICD-10-CM

## 2011-12-07 DIAGNOSIS — J189 Pneumonia, unspecified organism: Secondary | ICD-10-CM | POA: Diagnosis present

## 2011-12-07 LAB — BASIC METABOLIC PANEL
BUN: 35 mg/dL — ABNORMAL HIGH (ref 6–23)
Calcium: 9.5 mg/dL (ref 8.4–10.5)
GFR calc Af Amer: 52 mL/min — ABNORMAL LOW (ref >90)
GFR calc non Af Amer: 45 mL/min — ABNORMAL LOW (ref >90)
Potassium: 4.5 mEq/L (ref 3.5–5.1)
Sodium: 137 mEq/L (ref 135–145)

## 2011-12-07 LAB — GLUCOSE, CAPILLARY
Glucose-Capillary: 171 mg/dL — ABNORMAL HIGH (ref 70–99)
Glucose-Capillary: 177 mg/dL — ABNORMAL HIGH (ref 70–99)
Glucose-Capillary: 106 mg/dL — ABNORMAL HIGH (ref 70–99)

## 2011-12-07 LAB — CBC
MCHC: 33.3 g/dL (ref 30.0–36.0)
RDW: 14.2 % (ref 11.5–15.5)

## 2011-12-07 MED ORDER — ACETAMINOPHEN 325 MG PO TABS
650.0000 mg | ORAL_TABLET | Freq: Four times a day (QID) | ORAL | Status: DC | PRN
  Administered 2011-12-07 – 2011-12-08 (×3): 650 mg via ORAL
  Filled 2011-12-07 (×4): qty 2

## 2011-12-07 MED ORDER — TAMSULOSIN HCL 0.4 MG PO CAPS
0.4000 mg | ORAL_CAPSULE | Freq: Every day | ORAL | Status: DC
  Administered 2011-12-07 – 2011-12-10 (×4): 0.4 mg via ORAL
  Filled 2011-12-07 (×4): qty 1

## 2011-12-07 MED ORDER — LEVOFLOXACIN 750 MG PO TABS
750.0000 mg | ORAL_TABLET | ORAL | Status: DC
  Filled 2011-12-07: qty 1

## 2011-12-07 MED ORDER — INSULIN ASPART 100 UNIT/ML ~~LOC~~ SOLN: 0.0000 [IU] | Freq: Every day | SUBCUTANEOUS | Status: DC

## 2011-12-07 MED ORDER — HALOPERIDOL LACTATE 5 MG/ML IJ SOLN
INTRAMUSCULAR | Status: AC
  Filled 2011-12-07: qty 1

## 2011-12-07 MED ORDER — SODIUM CHLORIDE 0.9 % IV SOLN
INTRAVENOUS | Status: DC
  Administered 2011-12-07: 02:00:00 via INTRAVENOUS
  Administered 2011-12-08: 75 mL/h via INTRAVENOUS
  Administered 2011-12-08: 1000 mL via INTRAVENOUS

## 2011-12-07 MED ORDER — LEVOTHYROXINE SODIUM 112 MCG PO TABS
112.0000 ug | ORAL_TABLET | Freq: Every day | ORAL | Status: DC
  Administered 2011-12-07 – 2011-12-10 (×4): 112 ug via ORAL
  Filled 2011-12-07 (×4): qty 1

## 2011-12-07 MED ORDER — ASPIRIN EC 81 MG PO TBEC
81.0000 mg | DELAYED_RELEASE_TABLET | Freq: Every day | ORAL | Status: DC
  Administered 2011-12-07 – 2011-12-10 (×4): 81 mg via ORAL
  Filled 2011-12-07 (×4): qty 1

## 2011-12-07 MED ORDER — METOPROLOL TARTRATE 50 MG PO TABS
50.0000 mg | ORAL_TABLET | Freq: Two times a day (BID) | ORAL | Status: DC
  Administered 2011-12-07 – 2011-12-10 (×7): 50 mg via ORAL
  Filled 2011-12-07 (×8): qty 1

## 2011-12-07 MED ORDER — ENSURE COMPLETE PO LIQD
237.0000 mL | Freq: Every day | ORAL | Status: DC
  Administered 2011-12-07 – 2011-12-10 (×4): 237 mL via ORAL

## 2011-12-07 MED ORDER — DILTIAZEM HCL ER COATED BEADS 180 MG PO CP24
180.0000 mg | ORAL_CAPSULE | Freq: Every day | ORAL | Status: DC
  Administered 2011-12-07 – 2011-12-10 (×4): 180 mg via ORAL
  Filled 2011-12-07 (×4): qty 1

## 2011-12-07 MED ORDER — ACETAMINOPHEN 650 MG RE SUPP: 650.0000 mg | Freq: Four times a day (QID) | RECTAL | Status: DC | PRN

## 2011-12-07 MED ORDER — ENOXAPARIN SODIUM 30 MG/0.3ML ~~LOC~~ SOLN
30.0000 mg | SUBCUTANEOUS | Status: DC
  Administered 2011-12-07 – 2011-12-10 (×4): 30 mg via SUBCUTANEOUS
  Filled 2011-12-07 (×4): qty 0.3

## 2011-12-07 MED ORDER — HALOPERIDOL LACTATE 5 MG/ML IJ SOLN
2.0000 mg | Freq: Once | INTRAMUSCULAR | Status: AC
  Administered 2011-12-07: 2 mg via INTRAMUSCULAR

## 2011-12-07 MED ORDER — ONDANSETRON HCL 4 MG/2ML IJ SOLN: 4.0000 mg | Freq: Four times a day (QID) | INTRAMUSCULAR | Status: DC | PRN

## 2011-12-07 MED ORDER — INSULIN ASPART 100 UNIT/ML ~~LOC~~ SOLN
0.0000 [IU] | Freq: Three times a day (TID) | SUBCUTANEOUS | Status: DC
  Administered 2011-12-07: 3 [IU] via SUBCUTANEOUS
  Administered 2011-12-07 (×2): 2 [IU] via SUBCUTANEOUS
  Administered 2011-12-08 – 2011-12-09 (×3): 3 [IU] via SUBCUTANEOUS
  Administered 2011-12-09: 2 [IU] via SUBCUTANEOUS
  Administered 2011-12-09 – 2011-12-10 (×2): 1 [IU] via SUBCUTANEOUS
  Administered 2011-12-10: 2 [IU] via SUBCUTANEOUS

## 2011-12-07 MED ORDER — LEVOFLOXACIN IN D5W 750 MG/150ML IV SOLN
750.0000 mg | INTRAVENOUS | Status: DC
  Administered 2011-12-07 – 2011-12-09 (×2): 750 mg via INTRAVENOUS
  Filled 2011-12-07 (×3): qty 150

## 2011-12-07 MED ORDER — HALOPERIDOL LACTATE 5 MG/ML IJ SOLN
4.0000 mg | Freq: Once | INTRAMUSCULAR | Status: AC
Start: 2011-12-07 — End: 2011-12-07
  Administered 2011-12-07: 4 mg via INTRAMUSCULAR

## 2011-12-07 MED ORDER — SENNOSIDES-DOCUSATE SODIUM 8.6-50 MG PO TABS
1.0000 | ORAL_TABLET | Freq: Every evening | ORAL | Status: DC | PRN
  Filled 2011-12-07: qty 1

## 2011-12-07 MED ORDER — ONDANSETRON HCL 4 MG PO TABS: 4.0000 mg | ORAL_TABLET | Freq: Four times a day (QID) | ORAL | Status: DC | PRN

## 2011-12-07 NOTE — Progress Notes (Signed)
PT Cancellation Note  ___Treatment cancelled today due to medical issues with patient which prohibited   therapy  ___ Treatment cancelled today due to patient receiving procedure or test   ___ Treatment cancelled today due to patient's refusal to participate   _x__ Treatment cancelled today due to pt had been agitated overnight and now asleep. Pt was seen OOB to [facility] by CNA arelier. Will check on at next opportunity [facility] PT [phone]

## 2011-12-07 NOTE — Progress Notes (Signed)
Pt received to room 1302 via stretcher from ED.  Pt came to floor alone and not able to clarify admission hx. Pt is having hallucinations and does not know where he is. States he is" in [place]".  Will not be able to do a complete hx until am when his family is around. Will cont to monitor. Bed alarm is on. Sidney Ace

## 2011-12-07 NOTE — Progress Notes (Signed)
Palliative Medicine Team SW Received PMT consult for GOC and to discuss Hospice options, possible residential hospice consideration from Dr. Cena Benton. Spoke with pt who is oriented x2, but very hard of hearing and a bit confused, attended by sitter upon visit. With pt's permission, spoke with pt's dtr Dondra Spry by phone to schedule GOC for tomorrow Saturday 7/13 at 1pm. Dtr reports plan to call pt's son Jesusita Oka and sister in law Liborio Nixon to invite them to [facility] as well. Dtr reports expectation that there may be some discord regarding plan for pt. Note various reports of HCPOA in chart, no scanned documentation in EPIC for either. Feel free to call team phone for questions.   Dtr [name] (RN) [phone]  Kennieth Francois, LCSWA Team phone [phone]

## 2011-12-07 NOTE — H&P (Signed)
[facility] History and Physical  [name] NGE:[id number] DOB: [birth year] DOA: 12/06/2011  Referring physician: Mat Carne, emergency room resident PCP: Sissy Hoff, MD   Chief Complaint: Difficulty walking and hallucinations  HPI:  Patient is a 78 year old white male with past medical history metastatic prostate cancer , atrial flutter and hypothyroidism who lives at home by himself but has a home care nurse who noted today that the patient appeared to be having mild visual hallucinations (where he reported seeing animals) as well as some difficulty walking. He was brought in by his daughter to the emergency room.  In the emergency room, the patient was noted to have a white blood cell count of 11.7, elevated blood sugars in the 250-300 range, but the rest of his labs were unremarkable. By the time that he was evaluated in the emergency room, his hallucinations had an completely resolved and he looked to be at his baseline in terms of mentation. He did have some difficulty with ambulating him and there was a concern about overall weakness. It was recommended that he be watched overnight with hydration and rechecking her blood work in the morning. Hospitals were called for further evaluation.  Review of Systems:  When I saw the patient in the emergency room, he was quite fatigued. At this point his family had left. He woke up briefly to talk to me in an complained of his right leg causing him problems. He denies any shortness of breath or chest pain. He was appropriate and able to comment on the correct person, place and year. He denied any headaches, vision pain, chest pain, palpitations, shortness of breath, wheeze, cough, abdominal pain, hematuria, dysuria, constipation, diarrhea, focal extremity numbness or weakness or pain other than described in his right leg. Review of systems otherwise negative.   Past Medical History  Diagnosis Date  . Prostate ca   .  Hypothyroidism   . Hyperglycemia   . DMII (diabetes mellitus, type 2)   . Prostate cancer, primary, with metastasis from prostate to other site 12/06/2011   Past Surgical History  Procedure Date  . Melanoma removed from left leg 1971    x 14  . Left cataract extraction    Social History:  reports that he has never smoked. He has never used smokeless tobacco. He reports that he does not drink alcohol or use illicit drugs. normally patient ambulates with a cane. He lives at home with a care nurse. Family lives nearby.  Allergies  Allergen Reactions  . Accupril (Quinapril Hcl)   . Naproxen Rash    Patient states that he does not know any medical problems that run in his family.  Prior to Admission medications   Medication Sig Start Date End Date Taking? Authorizing Provider  acetaminophen (TYLENOL) 325 MG tablet Take 2 tablets (650 mg total) by mouth every 6 (six) hours as needed (or Fever >/= 101). 10/12/11 10/11/12 Yes Gwenyth Bender, NP  aspirin 81 MG tablet Take 81 mg by mouth daily.   Yes Historical Provider, MD  Cholecalciferol (VITAMIN D3) 1000 UNITS CHEW Chew 2 tablets by mouth daily.   Yes Historical Provider, MD  diltiazem (CARDIZEM CD) 180 MG 24 hr capsule Take 1 capsule (180 mg total) by mouth daily. 10/12/11 10/11/12 Yes Lesle Chris Black, NP  hydrOXYzine (VISTARIL) 25 MG capsule Take 25 mg by mouth 3 (three) times daily as needed. Sleep   Yes Historical Provider, MD  insulin glargine (LANTUS) 100 UNIT/ML injection Inject 10 Units  into the skin daily. 10/12/11 10/11/12 Yes Lesle Chris Black, NP  insulin lispro (HUMALOG) 100 UNIT/ML injection Inject 3 Units into the skin 3 (three) times daily before meals.   Yes Historical Provider, MD  levothyroxine (SYNTHROID, LEVOTHROID) 112 MCG tablet Take 112 mcg by mouth daily.   Yes Historical Provider, MD  LORazepam (ATIVAN) 0.5 MG tablet Take 0.5 mg by mouth every 8 (eight) hours. Sleeping   Yes Historical Provider, MD  metoprolol (LOPRESSOR) 50 MG  tablet Take 1 tablet (50 mg total) by mouth 2 (two) times daily. 10/12/11 10/11/12 Yes Lesle Chris Black, NP  senna-docusate (SENOKOT-S) 8.6-50 MG per tablet Take 1 tablet by mouth at bedtime as needed. 10/12/11 10/11/12 Yes Lesle Chris Black, NP  Tamsulosin HCl (FLOMAX) 0.4 MG CAPS Take 0.4 mg by mouth daily.   Yes Historical Provider, MD   Physical Exam: Filed Vitals:   12/06/11 1729  BP: 153/70  Pulse: 69  Temp: 99 F (37.2 C)  TempSrc: Oral  Resp: 16  SpO2: 96%     General:  Alert and oriented at least x2, no acute distress, fatigue, looks younger than stated age  Eyes: Sclera nonicteric, extraocular movements intact  ENT: Normocephalic, atraumatic, mucous membranes are slightly dry  Neck: Supple, no JVD, no appreciable thyromegaly  Cardiovascular: Irregular rhythm, rate controlled  Respiratory: Clear to auscultation bilaterally  Abdomen: Soft, nontender, nondistended, hypoactive bowel sounds  Skin: No, rashes or lesions. The exception to this is the patient's right lower extremity which notes signs of Charcot foot/leg with 2-3+ edema plus chronic induration and erythema  Musculoskeletal: Symmetric and nonfocal  Psychiatric: Patient is currently alert and oriented, no signs of any hallucinations, no psychoses  Neurologic: No focal neurological deficits  Labs on Admission:  Basic Metabolic Panel:  Lab 12/06/11 9604  NA 134*  K 5.0  CL 101  CO2 26  GLUCOSE 266*  BUN 39*  CREATININE 1.29  CALCIUM 8.8  MG --  PHOS --   Liver Function Tests:  Lab 12/06/11 1943  AST 19  ALT 21  ALKPHOS 118*  BILITOT 0.3  PROT 5.9*  ALBUMIN 2.9*   No results found for this basename: LIPASE:5,AMYLASE:5 in the last 168 hours No results found for this basename: AMMONIA:5 in the last 168 hours CBC:  Lab 12/06/11 1943  WBC 11.7*  NEUTROABS --  HGB 12.1*  HCT 36.0*  MCV 89.6  PLT 192   CBG:  Lab 12/06/11 1736  GLUCAP 309*    Radiological Exams on Admission: No results  found.  EKG: Independently reviewed. Reviewed, rate controlled  Assessment/Plan Principal Problem:  *Gait abnormality: See below. Have asked physical therapy to evaluate.  Active Problems:  Atrial flutter stable. Continue metoprolol and Cardizem.   Hypothyroidism: Stable. Continue Synthroid.   HTN (hypertension): Continue Cardizem and metoprolol.   DM (diabetes mellitus): Continue home dose of Lantus plus sliding scale.   BPH (benign prostatic hyperplasia): Stable medical issue.   Weakness generalized: Multifactorial, may be in part due to dehydration. No acute signs of any infection. Leukocytosis is mild and likely stress margination. Recheck labs in the morning. We'll have physical therapy evaluate.   Acute renal failure: Mild. We'll treat with gentle IV fluids.   Prostate cancer, primary, with metastasis from prostate to other site: Head CT not done. I see no point in that family wants nothing aggressive as per the ER physician. The patient is a DO NOT RESUSCITATE. In addition it would not change our management as currently  the patient is back to his baseline mentating.   Altered mental status: Resolved.    Code Status: DO NOT RESUSCITATE as per family.  Family Communication: [place], Raine [phone]  Disposition Plan: Discharge home tomorrow  Hollice Espy [facility] Pager [phone]  If 7PM-7AM, please contact night-coverage [email or web address] Password [facility] 12/07/2011, 12:46 AM

## 2011-12-07 NOTE — Progress Notes (Signed)
[facility] Progress Note   AMOL DOMANSKI ION:[id number] DOB: Dec 15, 1913 DOA: 12/06/2011 PCP: Sissy Hoff, MD  Assessment/Plan: Principal Problem:  *Gait abnormality Active Problems:  Atrial flutter  Hypothyroidism  HTN (hypertension)  DM (diabetes mellitus)  BPH (benign prostatic hyperplasia)  Weakness generalized  Acute renal failure  Prostate cancer, primary, with metastasis from prostate to other site  Altered mental status Pneumonia - CAP  AMS - Per family patient is at baseline today - Will order chest x ray as elderly person with elevated blood sugars and altered mentation is suspicious for infectious etiology.  Gait abnormality - Have physical therapy evaluate patient and make recommendations.  Active Problems:  Atrial flutter stable  -Continue metoprolol and Cardizem.   Hypothyroidism:  -Stable. Continue Synthroid.   HTN (hypertension):  Continue Cardizem and metoprolol.  Not well controlled currently but will continue to monitor blood pressures and adjust medication pending recorded blood pressures readings.  DM (diabetes mellitus): Continue home dose of Lantus plus sliding scale.   BPH (benign prostatic hyperplasia): Stable medical issue.   Weakness generalized: Multifactorial,  -We'll have physical therapy evaluate.   Acute renal failure: Mild. Will continue gentle fluid rehydration.   Prostate cancer, primary, with metastasis from prostate to other site: Head CT not done. The patient is a DO NOT RESUSCITATE.  - Given this history and discussion with POA currently she is requesting residential hospice and palliative care consult.  I agree with consulting palliative care for goals of care and help transitioning patient out of the hospital and into residential hospice with specific instructions for goals of care given his condition. - Placed consult and spoke with social worker to look for options for residential hospice  Leukocytosis/PNA -  chest x ray shows bibasilar opacities suspicious for pneumonia. - patient lives at home and as such would treat as CAP with IV levaquin - Likely causing AMS.    Code Status: DNR/DNI Family Communication: Spoke with daughter [name] [phone] Disposition Plan: Will have palliative help facilitate GOC and will f/u with family decision once social worker provides options.  Brief narrative: Please refer to hpi.  Consultants:  Palliative  Physical therapy  Procedures:  none  Antibiotics:  Levaquin  HPI/Subjective: Per caregiver patient has been coughing recently more.  Patient has no specific complaints currently.  He also had some agitation earlier today but since this has not been a problem and per family he is back at baseline.  Denies any fever, chills, nausea, abdominal pain, chest pain, HA's.   Objective: Blood pressure 160/84, pulse 68, temperature 97.3 F (36.3 C), temperature source Oral, resp. rate 16, height 6' (1.829 m), weight 82 kg (180 lb 12.4 oz), SpO2 96.00%. No intake or output data in the 24 hours ending 12/07/11 1605   Exam: General: No acute respiratory distress. Alert and Oriented x 1 to person but not place or time Lungs: Decreased breath sounds at bases, without wheezes or crackles Cardiovascular: Regular rate and rhythm without murmur gallop or rub normal S1 and S2 Abdomen: Nontender, nondistended, soft, bowel sounds positive, no rebound, no ascites, no appreciable mass Extremities: No significant cyanosis or clubbing.  Data Reviewed: Basic Metabolic Panel:  Lab 12/07/11 7253 12/06/11 1943  NA 137 134*  K 4.5 5.0  CL 101 101  CO2 28 26  GLUCOSE 208* 266*  BUN 35* 39*  CREATININE 1.30 1.29  CALCIUM 9.5 8.8  MG -- --  PHOS -- --   Liver Function Tests:  Lab 12/06/11  1943  AST 19  ALT 21  ALKPHOS 118*  BILITOT 0.3  PROT 5.9*  ALBUMIN 2.9*   No results found for this basename: LIPASE:5,AMYLASE:5 in the last 168 hours No results  found for this basename: AMMONIA:5 in the last 168 hours CBC:  Lab 12/07/11 0820 12/06/11 1943  WBC 14.4* 11.7*  NEUTROABS -- --  HGB 13.3 12.1*  HCT 40.0 36.0*  MCV 89.3 89.6  PLT 201 192   Cardiac Enzymes: No results found for this basename: CKTOTAL:5,CKMB:5,CKMBINDEX:5,TROPONINI:5 in the last 168 hours BNP (last 3 results)  Basename 10/08/11 1200  PROBNP 8566.0*   CBG:  Lab 12/07/11 1226 12/07/11 0716 12/06/11 1736  GLUCAP 171* 207* 309*    No results found for this or any previous visit (from the past 240 hour(s)).   Studies:  Recent x-ray studies have been reviewed in detail by the Attending Physician  Scheduled Meds:  Reviewed in detail by the Attending Physician   Penny Pia [facility] Office  [phone] Pager [phone]  On-Call/Text Page:      [email or web address]      password [email or web address]  If 8PM-7AM, please contact night-coverage [email or web address] Password [email or web address] 12/07/2011, 4:05 PM   LOS: 1 day

## 2011-12-07 NOTE — Progress Notes (Signed)
Dr.Vega text paged asking him to read the CXR results with probable pneumonia results.

## 2011-12-07 NOTE — Progress Notes (Signed)
ANTIBIOTIC CONSULT NOTE - INITIAL  Pharmacy Consult for Levaquin  Indication: pneumonia  Allergies  Allergen Reactions  . Accupril (Quinapril Hcl)   . Naproxen Rash    Patient Measurements: Height: 6' (182.9 cm) (estimation) Weight: 180 lb 12.4 oz (82 kg) IBW/kg (Calculated) : 77.6  Adjusted Body Weight:   Vital Signs: Temp: 97.5 F (36.4 C) (07/12 0120) Temp src: Axillary (07/12 0120) BP: 171/82 mmHg (07/12 0120) Pulse Rate: 69  (07/12 0120) Intake/Output from previous day:   Intake/Output from this shift:    Labs:  [facility] 12/07/11 0820 12/06/11 1943  WBC 14.4* 11.7*  HGB 13.3 12.1*  PLT 201 192  LABCREA -- --  CREATININE 1.30 1.29   Estimated Creatinine Clearance: 36.5 ml/min (by C-G formula based on Cr of 1.3). No results found for this basename: VANCOTROUGH:2,VANCOPEAK:2,VANCORANDOM:2,GENTTROUGH:2,GENTPEAK:2,GENTRANDOM:2,TOBRATROUGH:2,TOBRAPEAK:2,TOBRARND:2,AMIKACINPEAK:2,AMIKACINTROU:2,AMIKACIN:2, in the last 72 hours   Microbiology: No results found for this or any previous visit (from the past 720 hour(s)).  Medical History: Past Medical History  Diagnosis Date  . Prostate ca   . Hypothyroidism   . Hyperglycemia   . DMII (diabetes mellitus, type 2)   . Prostate cancer, primary, with metastasis from prostate to other site 12/06/2011    Medications:  Prescriptions prior to admission  Medication Sig Dispense Refill  . acetaminophen (TYLENOL) 325 MG tablet Take 2 tablets (650 mg total) by mouth every 6 (six) hours as needed (or Fever >/= 101).      Marland Kitchen aspirin 81 MG tablet Take 81 mg by mouth daily.      . Cholecalciferol (VITAMIN D3) 1000 UNITS CHEW Chew 2 tablets by mouth daily.      Marland Kitchen diltiazem (CARDIZEM CD) 180 MG 24 hr capsule Take 1 capsule (180 mg total) by mouth daily.      . hydrOXYzine (VISTARIL) 25 MG capsule Take 25 mg by mouth 3 (three) times daily as needed. Sleep      . insulin glargine (LANTUS) 100 UNIT/ML injection Inject 10 Units into  the skin daily.  10 mL    . insulin lispro (HUMALOG) 100 UNIT/ML injection Inject 3 Units into the skin 3 (three) times daily before meals.      Marland Kitchen levothyroxine (SYNTHROID, LEVOTHROID) 112 MCG tablet Take 112 mcg by mouth daily.      Marland Kitchen LORazepam (ATIVAN) 0.5 MG tablet Take 0.5 mg by mouth every 8 (eight) hours. Sleeping      . metoprolol (LOPRESSOR) 50 MG tablet Take 1 tablet (50 mg total) by mouth 2 (two) times daily.      Marland Kitchen senna-docusate (SENOKOT-S) 8.6-50 MG per tablet Take 1 tablet by mouth at bedtime as needed.      . Tamsulosin HCl (FLOMAX) 0.4 MG CAPS Take 0.4 mg by mouth daily.       Scheduled:    . sodium chloride   Intravenous Once  . aspirin EC  81 mg Oral Daily  . diltiazem  180 mg Oral Daily  . enoxaparin (LOVENOX) injection  30 mg Subcutaneous Q24H  . haloperidol lactate      . haloperidol lactate      . haloperidol lactate  2 mg Intramuscular Once  . haloperidol lactate  4 mg Intramuscular Once  . insulin aspart  0-5 Units Subcutaneous QHS  . insulin aspart  0-9 Units Subcutaneous TID WC  . insulin glargine  10 Units Subcutaneous Daily  . levofloxacin (LEVAQUIN) IV  750 mg Intravenous Q48H  . levothyroxine  112 mcg Oral Daily  . LORazepam  0.5 mg Oral Q8H  . metoprolol  50 mg Oral BID  . Tamsulosin HCl  0.4 mg Oral Daily  . DISCONTD: levofloxacin  750 mg Oral Q48H   Infusions:    . sodium chloride 75 mL/hr at 12/07/11 0900   Assessment: 89 yoM to start Levaquin for pneumonia. Estimated CrCl ~ 36 ml/min. Levaquin will need to be adjusted for renal function. No cultures currently.    Plan:  1. Start Levaquin 750 mg IV q 48 hours. 2. Will Continue to follow.   [name] 12/07/2011,10:15 AM

## 2011-12-07 NOTE — Progress Notes (Signed)
Pt again became agitated and kicking. Kicked Psychologist, sport and exercise. Pt's daughter notified that pt may have to be restrained to keep him safe. Pt's daughter stated that she wanted him to be comfortable and to do what we needed to do to keep him comfortable and safe. Hospitalist notified again for med for agitation. A order for Haldol was given and the med was given IM to patient.

## 2011-12-07 NOTE — Progress Notes (Signed)
Pt became very agitated and attempted to get out of bed. Kicking and screaming that he needed to leave. Hospitalist on call notified and orders received for Haldol.

## 2011-12-07 NOTE — Progress Notes (Signed)
Pt's son is here and states that he is the [facility] for pt. He understands that pt has been confused and that we have medicated him for agitation. Safety sitter has been provided for patient for this morning. Will cont to monitor. Pt now is calm and sleepy, but arousable.

## 2011-12-07 NOTE — Progress Notes (Signed)
Brief Nutrition Note  Reason: Low braden  Patient's RN/ care taker present at time of RD visit. She reported patient with a good appetite and intake. She reported patient ate well PTA. Ordered patient lunch tray at time of RD visit. She reported patient does not have any difficulty chewing or swallowing. She reported his weight has been stable.She reported patient drinks 1 Ensure nutrition supplement daily at home.  Noted plan to discharge patient tomorrow.   Will order patient 1 Ensure nutrition supplement daily. Provides 250 kcal and 9 grams of protein daily.   Will monitor PO intake.   RD to follow for nutrition plan of care.   Iven Finn [facility] [phone]

## 2011-12-08 DIAGNOSIS — R404 Transient alteration of awareness: Secondary | ICD-10-CM

## 2011-12-08 DIAGNOSIS — J189 Pneumonia, unspecified organism: Principal | ICD-10-CM

## 2011-12-08 DIAGNOSIS — I10 Essential (primary) hypertension: Secondary | ICD-10-CM

## 2011-12-08 DIAGNOSIS — R4182 Altered mental status, unspecified: Secondary | ICD-10-CM

## 2011-12-08 DIAGNOSIS — IMO0002 Reserved for concepts with insufficient information to code with codable children: Secondary | ICD-10-CM

## 2011-12-08 DIAGNOSIS — E039 Hypothyroidism, unspecified: Secondary | ICD-10-CM

## 2011-12-08 DIAGNOSIS — R52 Pain, unspecified: Secondary | ICD-10-CM

## 2011-12-08 DIAGNOSIS — R451 Restlessness and agitation: Secondary | ICD-10-CM

## 2011-12-08 DIAGNOSIS — R410 Disorientation, unspecified: Secondary | ICD-10-CM

## 2011-12-08 LAB — GLUCOSE, CAPILLARY
Glucose-Capillary: 145 mg/dL — ABNORMAL HIGH (ref 70–99)
Glucose-Capillary: 204 mg/dL — ABNORMAL HIGH (ref 70–99)
Glucose-Capillary: 152 mg/dL — ABNORMAL HIGH (ref 70–99)

## 2011-12-08 MED ORDER — TRAMADOL HCL 50 MG PO TABS
50.0000 mg | ORAL_TABLET | Freq: Four times a day (QID) | ORAL | Status: DC | PRN
  Administered 2011-12-08 – 2011-12-09 (×2): 50 mg via ORAL
  Filled 2011-12-08 (×2): qty 1

## 2011-12-08 NOTE — Progress Notes (Signed)
CSW was able to speak with Dondra Spry (dtr HCPOA) and receive permission to fax out FL2 to facilities in [facility].  CSW to follow.   Leron Croak, LCSWA [employer] [phone]

## 2011-12-08 NOTE — Progress Notes (Signed)
[facility] PROGRESS NOTE  [name] BMW:[id number] DOB: [birth year] DOA: 12/06/2011 PCP: Sissy Hoff, MD  Assessment/Plan: Principal Problem:  *Gait abnormality Active Problems:  Atrial flutter  Hypothyroidism  HTN (hypertension)  DM (diabetes mellitus)  BPH (benign prostatic hyperplasia)  Weakness generalized  Acute renal failure  Prostate cancer, primary, with metastasis from prostate to other site  Altered mental status  Pneumonia  AMS  - Per family patient is at baseline today  - Patient likely has active infection of lung based on last chest xray which may be contributing.  Currently is on antibiotics.  Gait abnormality  - Have physical therapy evaluate patient and make recommendations.   Active Problems:  Atrial flutter stable  -Continue metoprolol and Cardizem.   Hypothyroidism:  -Stable. Continue Synthroid.   HTN (hypertension):  Continue Cardizem and metoprolol. Not well controlled currently but will continue to monitor blood pressures and adjust medication pending recorded blood pressures readings.   DM (diabetes mellitus): Continue home dose of Lantus plus sliding scale.   BPH (benign prostatic hyperplasia): Stable medical issue.   Weakness generalized:  - Multifactorial,  -We'll have physical therapy evaluate and follow their recommendations.  Acute renal failure: Mild. Will continue gentle fluid rehydration.   Prostate cancer, primary, with metastasis from prostate to other site:  -Head CT not done.  -The patient is a DO NOT RESUSCITATE.   - Given this history and discussion with POA currently she is requesting residential hospice and palliative care consult. I agree with consulting palliative care for goals of care and help transitioning patient out of the hospital and into residential hospice with specific instructions for goals of care given his condition.  - Placed consult and spoke with social worker to look for options for  residential hospice  - Palliative on board and we will f/u with their recommendations.  Leukocytosis/PNA  - chest x ray shows bibasilar opacities suspicious for pneumonia.  - patient lives at home and as such would treat as CAP with IV levaquin  - Likely causing AMS.  Code Status: DNR/DNI  Family Communication: Spoke with daughter [name] [phone]  Disposition Plan: Will have palliative help facilitate GOC and will f/u with family decision once social worker provides options.    [name]  [facility] Pager [phone]. If 8PM-8AM, please contact night-coverage at [email or web address], password [facility] 12/08/2011, 7:15 PM  LOS: 2 days   Brief narrative: Please see HPI  Consultants:  Palliative  Physical therapy  Social worker  Procedures:  none  Antibiotics:  Levaquin day # 2  HPI/Subjective: No acute issues reported overnight.  No new complaints.  Objective: Filed Vitals:   12/07/11 2111 12/08/11 0009 12/08/11 0628 12/08/11 1426  BP: 190/93 168/74 164/90 158/84  Pulse: 70 68 70 70  Temp: 97.3 F (36.3 C)  97.4 F (36.3 C) 98 F (36.7 C)  TempSrc: Oral  Oral Oral  Resp: 16  15 16   Height:      Weight:      SpO2: 100%  97% 98%    Intake/Output Summary (Last 24 hours) at 12/08/11 1915 Last data filed at 12/08/11 1700  Gross per 24 hour  Intake   2434 ml  Output   3150 ml  Net   -716 ml    Exam:  General: No acute respiratory distress. Alert and Oriented x 1 to person but not place or time  Lungs: Decreased breath sounds at bases, without wheezes or crackles  Cardiovascular: Regular rate and rhythm without  murmur gallop or rub normal S1 and S2  Abdomen: Nontender, nondistended, soft, bowel sounds positive, no rebound, no ascites, no appreciable mass  Extremities: No significant cyanosis or clubbing.   Data Reviewed: Basic Metabolic Panel:  Lab 12/07/11 8469 12/06/11 1943  NA 137 134*  K 4.5 5.0  CL 101 101  CO2 28 26  GLUCOSE 208* 266*    BUN 35* 39*  CREATININE 1.30 1.29  CALCIUM 9.5 8.8  MG -- --  PHOS -- --   Liver Function Tests:  Lab 12/06/11 1943  AST 19  ALT 21  ALKPHOS 118*  BILITOT 0.3  PROT 5.9*  ALBUMIN 2.9*   No results found for this basename: LIPASE:5,AMYLASE:5 in the last 168 hours No results found for this basename: AMMONIA:5 in the last 168 hours CBC:  Lab 12/07/11 0820 12/06/11 1943  WBC 14.4* 11.7*  NEUTROABS -- --  HGB 13.3 12.1*  HCT 40.0 36.0*  MCV 89.3 89.6  PLT 201 192   Cardiac Enzymes: No results found for this basename: CKTOTAL:5,CKMB:5,CKMBINDEX:5,TROPONINI:5 in the last 168 hours BNP (last 3 results)  Basename 10/08/11 1200  PROBNP 8566.0*   CBG:  Lab 12/08/11 1642 12/08/11 1159 12/08/11 0804 12/07/11 2113 12/07/11 1658  GLUCAP 204* 145* 103* 106* 177*    No results found for this or any previous visit (from the past 240 hour(s)).   Studies: Dg Chest Port 1 View  12/07/2011  *RADIOLOGY REPORT*  Clinical Data: 78 year old male with leukocytosis and wheezing. Unclear source of infection.  PORTABLE CHEST - 1 VIEW  Comparison: 10/08/2011 and earlier.  Findings: Portable upright AP view at 0905 hours.  Slightly lower lung volumes.  Stable cardiac size and mediastinal contours. Interval worsening ventilation at both lung bases and new indistinct perihilar opacities.  No pneumothorax or definite edema. Probable small effusions.  IMPRESSION: Increased basilar and perihilar opacity since May with probable small effusions.  Appearance suspicious for bilateral pneumonia in this setting.  Original Report Authenticated By: Harley Hallmark, M.D.    Scheduled Meds:   . aspirin EC  81 mg Oral Daily  . diltiazem  180 mg Oral Daily  . enoxaparin (LOVENOX) injection  30 mg Subcutaneous Q24H  . feeding supplement  237 mL Oral Q1200  . insulin aspart  0-5 Units Subcutaneous QHS  . insulin aspart  0-9 Units Subcutaneous TID WC  . insulin glargine  10 Units Subcutaneous Daily  .  levofloxacin (LEVAQUIN) IV  750 mg Intravenous Q48H  . levothyroxine  112 mcg Oral Daily  . LORazepam  0.5 mg Oral Q8H  . metoprolol  50 mg Oral BID  . Tamsulosin HCl  0.4 mg Oral Daily   Continuous Infusions:   . sodium chloride 75 mL/hr (12/08/11 1816)    Principal Problem:  *Gait abnormality Active Problems:  Atrial flutter  Hypothyroidism  HTN (hypertension)  DM (diabetes mellitus)  BPH (benign prostatic hyperplasia)  Weakness generalized  Acute renal failure  Prostate cancer, primary, with metastasis from prostate to other site  Altered mental status  Pneumonia

## 2011-12-08 NOTE — Progress Notes (Signed)
CSW went to Pt's room to assess for d/c planning. No family was present at the time.   CSW then attempted to contact dtr Gail at home - [phone] or cell [phone]. Was unable to get in contact with dtr so CSW left a voice message on her cell phone to contact CSW with the results of the GOC meeting and d/c plan.    CSW also LM for the palliative team SW Kennieth Francois, [place] ,Team phone [phone] to access d/c plan.   CSW spoke with nurse and received information that Pt is from [facility] but unsure if Pt will be returning there upon d/c.   CSW to follow.  Leron Croak, LCSWA [employer] [phone]

## 2011-12-08 NOTE — Consult Note (Signed)
Patient EA:[id number] NIKESH TESCHNER      DOB: May 12, 1914      BJY:[id number]     Consult Note from the Palliative Medicine Team at [facility]    Consult Requested by: Dr. Cena Benton    PCP: Sissy Hoff, MD Reason for Consultation: GOC      Phone [phone] Related symptom rec Assessment of patients Current state: 78 year old white male admitted with failure to thrive, gait disturbance, generalized weakness, and hallucinations.  Patient was diagnosed with bilateral pneumonia.  Family has issue over controlling Reverend Vanderford's care plan.  GOC established that all parties are concerned the [facility] dignity and well being be maintained.  They agree that when the time is right that consideration for hospice care would be appropriate. For now the following goals are:   Goals of Care: 1.  Code Status: DNR/DNI   2. Scope of Treatment: 1. Vital Signs: as ordered , no change  2. Respiratory/Oxygen: Nasal canula titrate 3. Nutritional Support/Tube Feeds: comfort feeding is desired with no consideration for tube feeding.  Family would like to consider a speech evaluation.  I will speak with Dr. Cena Benton in the am. 4. Antibiotics: To be continued 5. Blood Products: to be dicussed at the time of need. 6. EXB:[id number] therapeutic IVF 7. Review of Medications to be discontinued:none at this time.  Family would like to continue to treat the treatable. 8. Labs:continue as needed 9. Consults:family anxious to see how patient will do with physical therapy.  They would also appreciate a speech therapy consult to rule out aspiration.   4. Disposition:  Family requests assistance from SW to locate an appropriate level of care for patient to be transferred.  First HCPOA is Dondra Spry she and her brother are having slight disagreement over some of the details.   3. Symptom Management:   1. Anxiety/Agitation: Gentle redirection is the best medicine, but the patient has been on ativan at home which has been  continued. 2. Pain: patient expresses no pain at this time.  Ultram is available and appropriate 3. Bowel Regimen: Continue senna; last BM PTA will reeval for additional agents.  Consider adding Colace. 4. Delirium: improved with the treatment of his pneumonia. Continue to reorient as much as possible.  Agree with sitter. 5. Nausea/Vomiting:  None reported  Prn zofran appropriate 6. Diabetes per primary team. 4. Psychosocial:  Meeting with Dondra Spry (dtr), gail's daughter (grand dtr) and daughter in law Dahlgren.  Jesusita Oka (son) elected not to come.  Third child Rayna Sexton committed suicide.    5. Spiritual:Chaplain visits would be appreciated.  Patient was a Programmer, systems.        Patient Documents Completed or Given: Document Given Completed  Advanced Directives Pkt    MOST    DNR    Gone from My Sight    Hard Choices      Brief HPI: 78 year old white male with bilateral pneumonia gradually declining at home. Patient has a history of prostate cancer metastatic to illium,  Ribs and right humeral shaft.  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  Patient is otherwise very stoic.  Denies  Pain, nausea vomiting.    PMH:  Past Medical History  Diagnosis Date  . Prostate ca   . Hypothyroidism   . Hyperglycemia   . DMII (diabetes mellitus, type 2)   . Prostate cancer, primary, with  metastasis from prostate to other site 12/06/2011     PSH: Past Surgical History  Procedure Date  . Melanoma removed from left leg 1971    x 14  . Left cataract extraction    I have reviewed the FH and SH and  If appropriate update it with new information. Allergies  Allergen Reactions  . Accupril (Quinapril Hcl)   . Naproxen Rash   Scheduled Meds:   . aspirin EC  81 mg Oral Daily  . diltiazem  180 mg Oral Daily  . enoxaparin (LOVENOX) injection  30 mg Subcutaneous Q24H  . feeding supplement  237 mL Oral  Q1200  . insulin aspart  0-5 Units Subcutaneous QHS  . insulin aspart  0-9 Units Subcutaneous TID WC  . insulin glargine  10 Units Subcutaneous Daily  . levofloxacin (LEVAQUIN) IV  750 mg Intravenous Q48H  . levothyroxine  112 mcg Oral Daily  . LORazepam  0.5 mg Oral Q8H  . metoprolol  50 mg Oral BID  . Tamsulosin HCl  0.4 mg Oral Daily   Continuous Infusions:   . sodium chloride 1,000 mL (12/08/11 0606)   PRN Meds:.acetaminophen, acetaminophen, hydrOXYzine, ondansetron (ZOFRAN) IV, ondansetron, senna-docusate    BP 158/84  Pulse 70  Temp 98 F (36.7 C) (Oral)  Resp 16  Ht 6' (1.829 m)  Wt 82 kg (180 lb 12.4 oz)  BMI 24.52 kg/m2  SpO2 98%   PPS:30%   Intake/Output Summary (Last 24 hours) at 12/08/11 1620 Last data filed at 12/08/11 1229  Gross per 24 hour  Intake   2434 ml  Output   2750 ml  Net   -316 ml     Last BM: prior to admission                    Stool Softner:Sennakot in place  Physical Exam:  General: mildly irritated over not being able to get up on his own HEENT:  PERRL, EOMI, anicteric, mm dry Chest:   Decreased with coarse breath sounds both bases, no wheezing ZOX:WRUEAVWUJ rate and rhythm Abdomen: soft not tender positive bowel sounds Ext: trace to plus one edema bilateral lower extremities Neuro: oriented to himself and family, not time or place.  Lacks impulse control.  Labs:     Component Value Date/Time   WBC 14.4* 12/07/2011 0820   RBC 4.48 12/07/2011 0820   HGB 13.3 12/07/2011 0820   HCT 40.0 12/07/2011 0820   PLT 201 12/07/2011 0820   MCV 89.3 12/07/2011 0820   MCH 29.7 12/07/2011 0820   MCHC 33.3 12/07/2011 0820   RDW 14.2 12/07/2011 0820   LYMPHSABS 2.5 10/08/2011 1200   MONOABS 0.7 10/08/2011 1200   EOSABS 0.1 10/08/2011 1200   BASOSABS 0.0 10/08/2011 1200      CMP     Component Value Date/Time   NA 137 12/07/2011 0820   K 4.5 12/07/2011 0820   CL 101 12/07/2011 0820   CO2 28 12/07/2011 0820   GLUCOSE 208* 12/07/2011 0820   BUN  35* 12/07/2011 0820   CREATININE 1.30 12/07/2011 0820   CALCIUM 9.5 12/07/2011 0820   PROT 5.9* 12/06/2011 1943   ALBUMIN 2.9* 12/06/2011 1943   AST 19 12/06/2011 1943   ALT 21 12/06/2011 1943   ALKPHOS 118* 12/06/2011 1943   BILITOT 0.3 12/06/2011 1943   GFRNONAA 45* 12/07/2011 0820   GFRAA 52* 12/07/2011 0820   Xray shows bilateral pneumonia    Time In Time Out Total Time  Spent with Patient Total Overall Time  115 pm 230 pm 20 min     Greater than 50%  of this time was spent counseling and coordinating care related to the above assessment and plan.    Destony Prevost L. Ladona Ridgel, MD MBA The Palliative Medicine Team at [facility] Phone: [phone] Pager: [phone]

## 2011-12-09 DIAGNOSIS — R52 Pain, unspecified: Secondary | ICD-10-CM

## 2011-12-09 DIAGNOSIS — E119 Type 2 diabetes mellitus without complications: Secondary | ICD-10-CM

## 2011-12-09 LAB — CBC
HCT: 37.7 % — ABNORMAL LOW (ref 39.0–52.0)
Hemoglobin: 12.7 g/dL — ABNORMAL LOW (ref 13.0–17.0)
MCH: 29.9 pg (ref 26.0–34.0)
MCHC: 33.7 g/dL (ref 30.0–36.0)
MCV: 88.7 fL (ref 78.0–100.0)
Platelets: 193 10*3/uL (ref 150–400)
RBC: 4.25 MIL/uL (ref 4.22–5.81)
RDW: 14.2 % (ref 11.5–15.5)
WBC: 11.8 10*3/uL — ABNORMAL HIGH (ref 4.0–10.5)

## 2011-12-09 LAB — GLUCOSE, CAPILLARY
Glucose-Capillary: 129 mg/dL — ABNORMAL HIGH (ref 70–99)
Glucose-Capillary: 240 mg/dL — ABNORMAL HIGH (ref 70–99)
Glucose-Capillary: 180 mg/dL — ABNORMAL HIGH (ref 70–99)

## 2011-12-09 MED ORDER — HYDRALAZINE HCL 20 MG/ML IJ SOLN
10.0000 mg | Freq: Once | INTRAMUSCULAR | Status: AC
  Administered 2011-12-09: 10 mg via INTRAVENOUS
  Filled 2011-12-09: qty 1

## 2011-12-09 NOTE — ED Provider Notes (Signed)
I saw and evaluated the patient, reviewed the resident's note and I agree with the findings and plan.   .Face to face Exam:  General:  Awake HEENT:  Atraumatic Resp:  Normal effort Abd:  Nondistended Neuro:No focal weakness Lymph: No adenopathy   Nelia Shi, MD 12/09/11 1032

## 2011-12-09 NOTE — Evaluation (Signed)
Physical Therapy Evaluation Patient Details Name: [name] MRN: [id number] DOB: 1913/06/20 Today's Date: 12/09/2011 Time: 4098-1191 PT Time Calculation (min): 14 min  PT Assessment / Plan / Recommendation Clinical Impression  78 yo male admitted for difficulty walking and hallucinations who has improved during this admission.  He is now able to walk with min assist, but needs mod assist for bed mobility.  He will need 24 hour care and will benefit from continued PT at D/C.  Recommend SNF    PT Assessment  Patient needs continued PT services    Follow Up Recommendations  Skilled nursing facility    Barriers to Discharge        Equipment Recommendations  Defer to next venue    Recommendations for Other Services     Frequency Min 3X/week    Precautions / Restrictions Restrictions Weight Bearing Restrictions: No   Pertinent Vitals/Pain Pt with no c/o pain      Mobility  Bed Mobility Bed Mobility: Supine to Sit;Sit to Supine Supine to Sit: 3: Mod assist Sit to Supine: 3: Mod assist Details for Bed Mobility Assistance: needs assist to position legs and upper body Transfers Transfers: Sit to Stand;Stand to Sit Sit to Stand: 4: Min assist;With upper extremity assist;From bed;From chair/3-in-1 Stand to Sit: 4: Min assist;With upper extremity assist Details for Transfer Assistance: pt needs consistent cues for hand placement to push up pt was unaware he was incontinent of urine Ambulation/Gait Ambulation/Gait Assistance: 4: Min assist Ambulation Distance (Feet): 200 Feet (100x2) Assistive device: Rolling walker Ambulation/Gait Assistance Details: cues for posture and to direct RW Gait Pattern: Step-through pattern;Trunk flexed Gait velocity: slow General Gait Details: pt was able to walk with RW for a considerable distance and able to continue walking after a seated rest break Stairs: No Wheelchair Mobility Wheelchair Mobility: No    Exercises     PT Diagnosis:  Difficulty walking;Generalized weakness  PT Problem List: Decreased strength;Decreased activity tolerance;Decreased mobility PT Treatment Interventions: DME instruction;Gait training;Functional mobility training;Therapeutic activities;Therapeutic exercise;Patient/family education   PT Goals Acute Rehab PT Goals PT Goal Formulation: With patient Time For Goal Achievement: 12/23/11 Potential to Achieve Goals: Fair Pt will go Supine/Side to Sit: with supervision PT Goal: Supine/Side to Sit - Progress: Goal set today Pt will go Sit to Stand: with supervision PT Goal: Sit to Stand - Progress: Goal set today Pt will Ambulate: >150 feet;with modified independence;with least restrictive assistive device PT Goal: Ambulate - Progress: Goal set today Pt will Perform Home Exercise Program: Independently PT Goal: Perform Home Exercise Program - Progress: Goal set today  Visit Information  Last PT Received On: 12/09/11 Assistance Needed: +1    Subjective Data  Subjective: I fell weak Patient Stated Goal: to get back to bed   Prior Functioning  Home Living Lives With: Alone;Other (Comment) (pt reports he had recently been at [facility]) Available Help at Discharge: Family Type of Home: House Home Access: Stairs to enter Secretary/administrator of Steps: 3 Entrance Stairs-Rails: Right Home Layout: One level Prior Function Comments: d/c plan is for SNF Communication Communication: HOH    Cognition  Overall Cognitive Status: Appears within functional limits for tasks assessed/performed Arousal/Alertness: Awake/alert Orientation Level: Appears intact for tasks assessed Behavior During Session: [facility] for tasks performed    Extremity/Trunk Assessment Right Lower Extremity Assessment RLE ROM/Strength/Tone: Deficits;WFL for tasks assessed RLE ROM/Strength/Tone Deficits: Generalized muscle atrophy Left Lower Extremity Assessment LLE ROM/Strength/Tone: [facility] for tasks assessed;Deficits LLE  ROM/Strength/Tone Deficits: generalized muscle atrophy Trunk  Assessment Trunk Assessment: Kyphotic   Balance Balance Balance Assessed: No  End of Session PT - End of Session Equipment Utilized During Treatment: Gait belt Activity Tolerance: Patient tolerated treatment well Patient left: in chair;with family/visitor present Nurse Communication: Mobility status  GP Functional Assessment Tool Used: Clinical judgement Functional Limitation: Mobility: Walking and moving around Mobility: Walking and Moving Around Current Status ([id number]): At least 40 percent but less than 60 percent impaired, limited or restricted Mobility: Walking and Moving Around Goal Status [phone]): At least 20 percent but less than 40 percent impaired, limited or restricted   Rosey Bath K. Lucrezia Dehne,PT [phone] 12/09/2011, 3:29 PM

## 2011-12-09 NOTE — Progress Notes (Addendum)
Clinical Social Work Department CLINICAL SOCIAL WORK PLACEMENT NOTE 12/09/2011  Patient:  [name], [name]  Account Number:  [id number] Admit date:  12/06/2011  Clinical Social Worker:  Leron Croak, CLINICAL SOCIAL WORKER  Date/time:  12/09/2011 01:19 PM  Clinical Social Work is seeking post-discharge placement for this patient at the following level of care:   SKILLED NURSING   (*CSW will update this form in Epic as items are completed)   12/09/2011  Patient/family provided with [facility] Department of Clinical Social Work's list of facilities offering this level of care within the geographic area requested by the patient (or if unable, by the patient's family).  12/09/2011  Patient/family informed of their freedom to choose among providers that offer the needed level of care, that participate in Medicare, Medicaid or managed care program needed by the patient, have an available bed and are willing to accept the patient.  12/09/2011  Patient/family informed of [facility]' ownership interest in [facility], as well as of the fact that they are under no obligation to receive care at this facility.  PASARR submitted to EDS on 10/12/2011 PASARR number received from EDS on 10/12/2011  FL2 transmitted to all facilities in geographic area requested by pt/family on  12/08/2011 FL2 transmitted to all facilities within larger geographic area on 12/08/2011  Patient informed that his/her managed care company has contracts with or will negotiate with  certain facilities, including the following:     Patient/family informed of bed offers received:  12/10/2011 Patient chooses bed at [facility] Physician recommends and patient chooses bed at  [facility]  Patient to be transferred to  on  12/10/11 Patient to be transferred to facility by [facility]AR  The following physician request were entered in Epic:   Additional Comments:  Leron Croak, Leeroy Bock Long  Weekend Coverage [phone]

## 2011-12-09 NOTE — Progress Notes (Signed)
[facility] PROGRESS NOTE  [name] ZOX:[id number] DOB: 02-Jul-1913 DOA: 12/06/2011 PCP: Sissy Hoff, MD  Assessment/Plan: Principal Problem:  *Gait abnormality Active Problems:  Atrial flutter  Hypothyroidism  HTN (hypertension)  DM (diabetes mellitus)  BPH (benign prostatic hyperplasia)  Weakness generalized  Acute renal failure  Prostate cancer, primary, with metastasis from prostate to other site  Altered mental status  Pneumonia  Delirium  Agitation  Pain, generalized  AMS  - Per family patient at baseline   - Patient likely has active infection of lung based on last chest xray which may be contributing. Currently is on antibiotics and mentation improved on this regimen  Gait abnormality  - Physical therapy has evaluated patient and has recommended SNF  Active Problems:  Atrial flutter stable  -Continue metoprolol and Cardizem.   Hypothyroidism:  -Stable. Continue Synthroid.   HTN (hypertension):  Continue Cardizem and metoprolol. Not well controlled currently but will continue to monitor blood pressures and adjust medication pending recorded blood pressures readings.   DM (diabetes mellitus): Continue home dose of Lantus plus sliding scale.   BPH (benign prostatic hyperplasia): Stable medical issue.   Weakness generalized:  - Multifactorial,  -We'll have physical therapy evaluate and follow their recommendations.  Acute renal failure: Mild. Will continue gentle fluid rehydration.  Prostate cancer, primary, with metastasis from prostate to other site:  -Head CT not done.  -The patient is a DO NOT RESUSCITATE.   After discussing case with palliative care team patient does not meet residential hospice criteria and therefore will more than likely transition back to SNF once able.  Likely tomorrow given continued improvement.   Leukocytosis/PNA  - chest x ray shows bibasilar opacities suspicious for pneumonia.  - patient lives at home and after  PT evaluation was recommended to transition to SNF.  Palliative care team has touched base with social worker for placement into SNF.  - Likely causing AMS but resolving with current antibiotic regimen.  Code Status: DNR/DNI  Family Communication: Spoke with daughter [name] [phone]  Disposition Plan:   Will D/c tomorrow to SNF if bed available with continued clinical improvement  Penny Pia  [facility] Pager [phone]. If 8PM-8AM, please contact night-coverage at [email or web address], password [facility] 12/09/2011, 5:44 PM  LOS: 3 days   Brief narrative:  Please refer to hpi.  Consultants:  Palliative  Physical therapy Procedures:  none Antibiotics:  Levaquin  HPI/Subjective: Patient has no acute complaints today.  No acute issues reported overnight.  Objective: Filed Vitals:   12/08/11 1426 12/08/11 2033 12/09/11 0437 12/09/11 1343  BP: 158/84 163/77 195/91 154/77  Pulse: 70 66 71 73  Temp: 98 F (36.7 C) 97.4 F (36.3 C) 98.3 F (36.8 C) 97.5 F (36.4 C)  TempSrc: Oral Oral Oral Oral  Resp: 16 18 19 16   Height:      Weight:      SpO2: 98% 94% 100% 99%    Intake/Output Summary (Last 24 hours) at 12/09/11 1744 Last data filed at 12/09/11 0438  Gross per 24 hour  Intake    230 ml  Output     50 ml  Net    180 ml    Exam:  General: No acute respiratory distress. Alert and Oriented x 1 to person but not place or time  Lungs: Decreased breath sounds at bases, without wheezes or crackles  Cardiovascular: Regular rate and rhythm without murmur gallop or rub normal S1 and S2  Abdomen: Nontender, nondistended, soft, bowel sounds positive,  no rebound, no ascites, no appreciable mass  Extremities: No significant cyanosis or clubbing.  Data Reviewed: Basic Metabolic Panel:  Lab 12/07/11 1610 12/06/11 1943  NA 137 134*  K 4.5 5.0  CL 101 101  CO2 28 26  GLUCOSE 208* 266*  BUN 35* 39*  CREATININE 1.30 1.29  CALCIUM 9.5 8.8  MG -- --  PHOS -- --   Liver  Function Tests:  Lab 12/06/11 1943  AST 19  ALT 21  ALKPHOS 118*  BILITOT 0.3  PROT 5.9*  ALBUMIN 2.9*   No results found for this basename: LIPASE:5,AMYLASE:5 in the last 168 hours No results found for this basename: AMMONIA:5 in the last 168 hours CBC:  Lab 12/09/11 0405 12/07/11 0820 12/06/11 1943  WBC 11.8* 14.4* 11.7*  NEUTROABS -- -- --  HGB 12.7* 13.3 12.1*  HCT 37.7* 40.0 36.0*  MCV 88.7 89.3 89.6  PLT 193 201 192   Cardiac Enzymes: No results found for this basename: CKTOTAL:5,CKMB:5,CKMBINDEX:5,TROPONINI:5 in the last 168 hours BNP (last 3 results)  Basename 10/08/11 1200  PROBNP 8566.0*   CBG:  Lab 12/09/11 1149 12/09/11 0803 12/08/11 2105 12/08/11 1642 12/08/11 1159  GLUCAP 240* 129* 152* 204* 145*    No results found for this or any previous visit (from the past 240 hour(s)).   Studies: Dg Chest Port 1 View  12/07/2011  *RADIOLOGY REPORT*  Clinical Data: 78 year old male with leukocytosis and wheezing. Unclear source of infection.  PORTABLE CHEST - 1 VIEW  Comparison: 10/08/2011 and earlier.  Findings: Portable upright AP view at 0905 hours.  Slightly lower lung volumes.  Stable cardiac size and mediastinal contours. Interval worsening ventilation at both lung bases and new indistinct perihilar opacities.  No pneumothorax or definite edema. Probable small effusions.  IMPRESSION: Increased basilar and perihilar opacity since May with probable small effusions.  Appearance suspicious for bilateral pneumonia in this setting.  Original Report Authenticated By: Harley Hallmark, M.D.    Scheduled Meds:   . aspirin EC  81 mg Oral Daily  . diltiazem  180 mg Oral Daily  . enoxaparin (LOVENOX) injection  30 mg Subcutaneous Q24H  . feeding supplement  237 mL Oral Q1200  . hydrALAZINE  10 mg Intravenous Once  . insulin aspart  0-5 Units Subcutaneous QHS  . insulin aspart  0-9 Units Subcutaneous TID WC  . insulin glargine  10 Units Subcutaneous Daily  . levofloxacin  (LEVAQUIN) IV  750 mg Intravenous Q48H  . levothyroxine  112 mcg Oral Daily  . LORazepam  0.5 mg Oral Q8H  . metoprolol  50 mg Oral BID  . Tamsulosin HCl  0.4 mg Oral Daily   Continuous Infusions:   . DISCONTD: sodium chloride 75 mL/hr (12/08/11 1816)    Principal Problem:  *Gait abnormality Active Problems:  Atrial flutter  Hypothyroidism  HTN (hypertension)  DM (diabetes mellitus)  BPH (benign prostatic hyperplasia)  Weakness generalized  Acute renal failure  Prostate cancer, primary, with metastasis from prostate to other site  Altered mental status  Pneumonia  Delirium  Agitation  Pain, generalized

## 2011-12-09 NOTE — Progress Notes (Addendum)
Physical Therapy Treatment Patient Details Name: [name] MRN: [id number] DOB: 18-Apr-1914 Today's Date: 12/09/2011 Time: 7829-5621 PT Time Calculation (min): 14 min  PT Assessment / Plan / Recommendation Comments on Treatment Session :  Pt with decreased activity tolerance to sitting up.  Unaware of urinary incontinence       Follow Up Recommendations  Skilled nursing facility    Barriers to Discharge        Equipment Recommendations  Defer to next venue    Recommendations for Other Services    Frequency Min 3X/week   Plan      Precautions / Restrictions Restrictions Weight Bearing Restrictions: No   Pertinent Vitals/Pain Pt with increased weakness after sitting up about 30 minutes    Mobility  Bed Mobility Bed Mobility: Supine to Sit;Sit to Supine Supine to Sit: 3: Mod assist Sit to Supine: 3: Mod assist Details for Bed Mobility Assistance: needs assist to position legs and upper body Transfers Transfers: Sit to Stand;Stand to Sit Sit to Stand: 4: Min assist;With upper extremity assist;From bed;From chair/3-in-1 Stand to Sit: 4: Min assist;With upper extremity assist Details for Transfer Assistance: pt needs consistent cues for hand placement to push up pt was unaware he was incontinent of urine Ambulation/Gait Ambulation/Gait Assistance: 4: Min assist Ambulation Distance (Feet): 40 Feet (20x 2  (to bathroom then back to bed)) Assistive device: Rolling walker Ambulation/Gait Assistance Details: more assistance needed after pt sat on 3in1 over commode due to feeling more weak Gait Pattern: Step-through pattern;Trunk flexed Gait velocity: slow General Gait Details: pt was able to walk with RW for a considerable distance and able to continue walking after a seated rest break Stairs: No Wheelchair Mobility Wheelchair Mobility: No    Exercises     PT Diagnosis: Difficulty walking;Generalized weakness  PT Problem List: Decreased strength;Decreased activity  tolerance;Decreased mobility PT Treatment Interventions: DME instruction;Gait training;Functional mobility training;Therapeutic activities;Therapeutic exercise;Patient/family education   PT Goals Acute Rehab PT Goals PT Goal Formulation: With patient Time For Goal Achievement: 12/23/11 Potential to Achieve Goals: Fair Pt will go Supine/Side to Sit: with supervision PT Goal: Supine/Side to Sit - Progress: Goal set today Pt will go Sit to Stand: with supervision PT Goal: Sit to Stand - Progress: Goal set today Pt will Ambulate: >150 feet;with modified independence;with least restrictive assistive device PT Goal: Ambulate - Progress: Goal set today Pt will Perform Home Exercise Program: Independently PT Goal: Perform Home Exercise Program - Progress: Goal set today  Visit Information  Last PT Received On: 12/09/11 Assistance Needed: +1    Subjective Data  Subjective: I fell weak Patient Stated Goal: to get back to bed   Cognition  Overall Cognitive Status: Appears within functional limits for tasks assessed/performed Arousal/Alertness: Awake/alert Orientation Level: Appears intact for tasks assessed Behavior During Session: [facility] for tasks performed    Balance  Balance Balance Assessed: No  End of Session PT - End of Session Equipment Utilized During Treatment: Gait belt Activity Tolerance: Patient limited by fatigue Patient left: in chair;with family/visitor present Nurse Communication: Mobility status   GP Functional Assessment Tool Used: Clinical judgement Functional Limitation: Mobility: Walking and moving around Mobility: Walking and Moving Around Current Status [phone]): At least 40 percent but less than 60 percent impaired, limited or restricted Mobility: Walking and Moving Around Goal Status [phone]): At least 20 percent but less than 40 percent impaired, limited or restricted   Donnetta Hail 12/09/2011, 3:33 PM

## 2011-12-09 NOTE — Progress Notes (Signed)
Clinical Social Work Department BRIEF PSYCHOSOCIAL ASSESSMENT 12/09/2011  Patient:  [name], [name]     Account Number:  [id number]     Admit date:  12/06/2011  Clinical Social Worker:  Leron Croak, CLINICAL SOCIAL WORKER  Date/Time:  12/08/2011 08:02 AM  Referred by:  Physician  Date Referred:  12/08/2011 Referred for  SNF Placement   Other Referral:   Interview type:  Family Other interview type:   Palliative MD (Dr. Ladona Ridgel) and Nurse.    PSYCHOSOCIAL DATA Living Status:  FACILITY Admitted from facility:  [facility] Level of care:  Skilled Nursing Facility Primary support name:  [name] Primary support relationship to patient:  CHILD, ADULT Degree of support available:   Good    CURRENT CONCERNS Current Concerns  Post-Acute Placement   Other Concerns:   Family is having conflict between HCPOA andd Financial POA. Dr. Ladona Ridgel is obtaining clarification from leagal counsel about wording on the [facility] and attempting to come to an agreement as to placement of Pt.    SOCIAL WORK ASSESSMENT / PLAN CSW spoke with Palliative MD, Nurse and Daughter to work on d/c planning. Pt status is such that placement is needed, however Pt does not meet criteria for residential hospice. MD does feel that some hospice assistance would be appropriate within a facility.   Assessment/plan status:  Information/Referral to [facility] Other assessment/ plan:   Obtained permission to fax out  FL2 in order to give bed offers that would satisfy both Financial POA and HCPOA.   Information/referral to community resources:   List of facilities in [employer].    PATIENT'S/FAMILY'S RESPONSE TO PLAN OF CARE: Daughter Dondra Spry) was appreciative of CSW's assistance in placement and preventive measures for resolving familial conflict.        Leron Croak, LCSWA [employer] [phone]

## 2011-12-10 LAB — CREATININE, SERUM: GFR calc Af Amer: 45 mL/min — ABNORMAL LOW (ref >90)

## 2011-12-10 LAB — CBC
MCV: 88.6 fL (ref 78.0–100.0)
Platelets: 168 10*3/uL (ref 150–400)
RBC: 4.12 MIL/uL — ABNORMAL LOW (ref 4.22–5.81)
RDW: 14.2 % (ref 11.5–15.5)
WBC: 11.7 10*3/uL — ABNORMAL HIGH (ref 4.0–10.5)

## 2011-12-10 LAB — GLUCOSE, CAPILLARY
Glucose-Capillary: 110 mg/dL — ABNORMAL HIGH (ref 70–99)
Glucose-Capillary: 125 mg/dL — ABNORMAL HIGH (ref 70–99)

## 2011-12-10 MED ORDER — LEVOFLOXACIN 750 MG PO TABS: 750.0000 mg | ORAL_TABLET | Freq: Every day | ORAL | Status: AC

## 2011-12-10 MED ORDER — ENSURE COMPLETE PO LIQD: 237.0000 mL | Freq: Every day | ORAL | Status: DC

## 2011-12-10 NOTE — Progress Notes (Addendum)
Pt to transfer to [facility] today via [facility].  CSW confirmed with ALF director, Zane Herald, that their facility is able to provide appropriate level of care.  Pt's family completed ppw at facility and reports they believe ALF dispo is best option for pt. D/C packet complete with chart copy, signed FL2, and signed hard rx. CSW signing off as no other CSW needs identified. Dellie Burns, MSW, LCSWA [phone] (coverage)  [facility] [phone]

## 2011-12-10 NOTE — Progress Notes (Signed)
ANTIBIOTIC CONSULT NOTE - Follow-up  Pharmacy Consult for Levaquin  Indication: pneumonia  Allergies  Allergen Reactions  . Accupril (Quinapril Hcl)   . Naproxen Rash    Patient Measurements: Height: 6' (182.9 cm) (estimation) Weight: 180 lb 12.4 oz (82 kg) IBW/kg (Calculated) : 77.6  Adjusted Body Weight:   Vital Signs:   Intake/Output from previous day: 07/14 0701 - 07/15 0700 In: 720 [P.O.:720] Out: 550 [Urine:550] Intake/Output from this shift: Total I/O In: 180 [P.O.:180] Out: 200 [Urine:200]  Labs:  [facility] 12/10/11 0356 12/09/11 0405  WBC 11.7* 11.8*  HGB 12.5* 12.7*  PLT 168 193  LABCREA -- --  CREATININE 1.45* --   Estimated Creatinine Clearance: 32.7 ml/min (by C-G formula based on Cr of 1.45). No results found for this basename: VANCOTROUGH:2,VANCOPEAK:2,VANCORANDOM:2,GENTTROUGH:2,GENTPEAK:2,GENTRANDOM:2,TOBRATROUGH:2,TOBRAPEAK:2,TOBRARND:2,AMIKACINPEAK:2,AMIKACINTROU:2,AMIKACIN:2, in the last 72 hours   Microbiology: No results found for this or any previous visit (from the past 720 hour(s)).  Medical History: Past Medical History  Diagnosis Date  . Prostate ca   . Hypothyroidism   . Hyperglycemia   . DMII (diabetes mellitus, type 2)   . Prostate cancer, primary, with metastasis from prostate to other site 12/06/2011    Medications:  Prescriptions prior to admission  Medication Sig Dispense Refill  . acetaminophen (TYLENOL) 325 MG tablet Take 2 tablets (650 mg total) by mouth every 6 (six) hours as needed (or Fever >/= 101).      Marland Kitchen aspirin 81 MG tablet Take 81 mg by mouth daily.      . Cholecalciferol (VITAMIN D3) 1000 UNITS CHEW Chew 2 tablets by mouth daily.      Marland Kitchen diltiazem (CARDIZEM CD) 180 MG 24 hr capsule Take 1 capsule (180 mg total) by mouth daily.      . hydrOXYzine (VISTARIL) 25 MG capsule Take 25 mg by mouth 3 (three) times daily as needed. Sleep      . insulin glargine (LANTUS) 100 UNIT/ML injection Inject 10 Units into the skin  daily.  10 mL    . insulin lispro (HUMALOG) 100 UNIT/ML injection Inject 3 Units into the skin 3 (three) times daily before meals.      Marland Kitchen levothyroxine (SYNTHROID, LEVOTHROID) 112 MCG tablet Take 112 mcg by mouth daily.      Marland Kitchen LORazepam (ATIVAN) 0.5 MG tablet Take 0.5 mg by mouth every 8 (eight) hours. Sleeping      . metoprolol (LOPRESSOR) 50 MG tablet Take 1 tablet (50 mg total) by mouth 2 (two) times daily.      Marland Kitchen senna-docusate (SENOKOT-S) 8.6-50 MG per tablet Take 1 tablet by mouth at bedtime as needed.      . Tamsulosin HCl (FLOMAX) 0.4 MG CAPS Take 0.4 mg by mouth daily.       Scheduled:     . aspirin EC  81 mg Oral Daily  . diltiazem  180 mg Oral Daily  . enoxaparin (LOVENOX) injection  30 mg Subcutaneous Q24H  . feeding supplement  237 mL Oral Q1200  . insulin aspart  0-5 Units Subcutaneous QHS  . insulin aspart  0-9 Units Subcutaneous TID WC  . insulin glargine  10 Units Subcutaneous Daily  . levofloxacin (LEVAQUIN) IV  750 mg Intravenous Q48H  . levothyroxine  112 mcg Oral Daily  . LORazepam  0.5 mg Oral Q8H  . metoprolol  50 mg Oral BID  . Tamsulosin HCl  0.4 mg Oral Daily   Infusions:   Assessment: 89 yoM on Levaquin 750 mg IV q48 hours D#4. Estimated  CrCl ~ 33 ml/min. Levaquin has been adjusted per renal function. No cultures currently.    Plan:  1. Continue Levaquin 750 mg IV q 48 hours. Will switch to PO for tomorrow's dose if patient remains inpatient.  2. Will Continue to follow.   Lodema Hong 12/10/2011,9:46 AM

## 2011-12-10 NOTE — Discharge Summary (Signed)
Physician Discharge Summary  [name] EAV:[id number] DOB: [birth year] DOA: 12/06/2011  PCP: Sissy Hoff, MD  Admit date: 12/06/2011 Discharge date: 12/10/2011  Recommendations for Outpatient Follow-up:  1. Please follow up breathing status and creatinine level.  Discharge Diagnoses:  Principal Problem:  *Gait abnormality Active Problems:  Atrial flutter  Hypothyroidism  HTN (hypertension)  DM (diabetes mellitus)  BPH (benign prostatic hyperplasia)  Weakness generalized  Acute renal failure  Prostate cancer, primary, with metastasis from prostate to other site  Altered mental status  Pneumonia  Delirium  Agitation  Pain, generalized   AMS  - Per family patient at baseline  - Patient likely has active infection of lung based on last chest xray which may be contributing. Currently is on antibiotics and mentation improved on this regimen.  Will plan for a 10 day total course of antibiotics with levaquin.  Gait abnormality  - Physical therapy has evaluated patient and has recommended SNF   Active Problems:   Atrial flutter stable  -Continue metoprolol and Cardizem as outpatient and was continued while patient was in house.   Hypothyroidism:  -Stable. Continue Synthroid .  HTN (hypertension):  Continue Cardizem and metoprolol. Have patient follow up with primary care physician.  DM (diabetes mellitus): Continue home dose of Lantus plus sliding scale.   BPH (benign prostatic hyperplasia): Stable medical issue.  Weakness generalized:  - Multifactorial,  -We'll have physical therapy evaluate and follow their recommendations.  Acute renal failure: Mild. Will continue gentle fluid rehydration.  Prostate cancer, primary, with metastasis from prostate to other site:  -Head CT not done.   -The patient is a DO NOT RESUSCITATE.    Discharge Condition: Stable and deconditioned  Diet recommendation: heart healthy low sodium  History of present illness:  From  original HPI:  Patient is a 78 year old white male with past medical history metastatic prostate cancer , atrial flutter and hypothyroidism who lives at home by himself but has a home care nurse who noted today that the patient appeared to be having mild visual hallucinations (where he reported seeing animals) as well as some difficulty walking. He was brought in by his daughter to the emergency room.  In the emergency room, the patient was noted to have a white blood cell count of 11.7, elevated blood sugars in the 250-300 range, but the rest of his labs were unremarkable. By the time that he was evaluated in the emergency room, his hallucinations had an completely resolved and he looked to be at his baseline in terms of mentation. He did have some difficulty with ambulating him and there was a concern about overall weakness. It was recommended that he be watched overnight with hydration and rechecking her blood work in the morning. Hospitals were called for further evaluation.   Hospital Course:  Patient was admitted with AMS and was found to have PNA on further work up.  I started patient on levaquin and his condition improved.  There was concern about living situation given that patient was living at home by himself and was unable to properly care for himself.  As such Palliative care team was consulted and with their help we will have patient transition out of the hospital to ALF.  He did not meet criteria for placement into hospice.  Procedures:  none  Consultations:  Palliative  Social worker  Discharge Exam: Filed Vitals:   12/09/11 2005  BP: 148/72  Pulse: 70  Resp: 18   Filed Vitals:   12/08/11 2033  12/09/11 0437 12/09/11 1343 12/09/11 2005  BP: 163/77 195/91 154/77 148/72  Pulse: 66 71 73 70  Temp:  98.3 F (36.8 C) 97.5 F (36.4 C) 97.5 F (36.4 C)  TempSrc: Oral Oral Oral Oral  Resp: 18 19 16 18   Height:      Weight:      SpO2: 94% 100% 99% 98%   General: Pt in  NAD, Alert and Awake Cardiovascular: irregularly irregular rate controlled, no murmurs or gallops Respiratory: No wheezes, patient speaking in full sentences.  Discharge Instructions  Discharge Orders    Future Orders Please Complete By Expires   Diet - low sodium heart healthy      Increase activity slowly      Discharge instructions      Comments:   Please take antibiotics as indicated and follow up with primary care physician at your ALF facility.   Call MD for:  temperature >100.4      Call MD for:  difficulty breathing, headache or visual disturbances      Call MD for:  extreme fatigue        Medication List  As of 12/10/2011 11:21 AM   TAKE these medications         acetaminophen 325 MG tablet   Commonly known as: TYLENOL   Take 2 tablets (650 mg total) by mouth every 6 (six) hours as needed (or Fever >/= 101).      aspirin 81 MG tablet   Take 81 mg by mouth daily.      diltiazem 180 MG 24 hr capsule   Commonly known as: CARDIZEM CD   Take 1 capsule (180 mg total) by mouth daily.      feeding supplement Liqd   Take 237 mLs by mouth daily at 12 noon.      hydrOXYzine 25 MG capsule   Commonly known as: VISTARIL   Take 25 mg by mouth 3 (three) times daily as needed. Sleep      insulin glargine 100 UNIT/ML injection   Commonly known as: LANTUS   Inject 10 Units into the skin daily.      insulin lispro 100 UNIT/ML injection   Commonly known as: HUMALOG   Inject 3 Units into the skin 3 (three) times daily before meals.      levofloxacin 750 MG tablet   Commonly known as: LEVAQUIN   Take 1 tablet (750 mg total) by mouth daily.   Start taking on: 12/11/2011      levothyroxine 112 MCG tablet   Commonly known as: SYNTHROID, LEVOTHROID   Take 112 mcg by mouth daily.      LORazepam 0.5 MG tablet   Commonly known as: ATIVAN   Take 0.5 mg by mouth every 8 (eight) hours. Sleeping      metoprolol 50 MG tablet   Commonly known as: LOPRESSOR   Take 1 tablet (50 mg  total) by mouth 2 (two) times daily.      senna-docusate 8.6-50 MG per tablet   Commonly known as: Senokot-S   Take 1 tablet by mouth at bedtime as needed.      Tamsulosin HCl 0.4 MG Caps   Commonly known as: FLOMAX   Take 0.4 mg by mouth daily.      Vitamin D3 1000 UNITS Chew   Chew 2 tablets by mouth daily.           Follow-up Information    Follow up with Sissy Hoff, MD. Schedule an appointment as  soon as possible for a visit in 1 week.   Contact information:   [address] [place] [address] [phone]           The results of significant diagnostics from this hospitalization (including imaging, microbiology, ancillary and laboratory) are listed below for reference.    Significant Diagnostic Studies: Dg Chest Port 1 View  12/07/2011  *RADIOLOGY REPORT*  Clinical Data: 78 year old male with leukocytosis and wheezing. Unclear source of infection.  PORTABLE CHEST - 1 VIEW  Comparison: 10/08/2011 and earlier.  Findings: Portable upright AP view at 0905 hours.  Slightly lower lung volumes.  Stable cardiac size and mediastinal contours. Interval worsening ventilation at both lung bases and new indistinct perihilar opacities.  No pneumothorax or definite edema. Probable small effusions.  IMPRESSION: Increased basilar and perihilar opacity since May with probable small effusions.  Appearance suspicious for bilateral pneumonia in this setting.  Original Report Authenticated By: Harley Hallmark, M.D.    Microbiology: No results found for this or any previous visit (from the past 240 hour(s)).   Labs: Basic Metabolic Panel:  Lab 12/10/11 9147 12/07/11 0820 12/06/11 1943  NA -- 137 134*  K -- 4.5 5.0  CL -- 101 101  CO2 -- 28 26  GLUCOSE -- 208* 266*  BUN -- 35* 39*  CREATININE 1.45* 1.30 1.29  CALCIUM -- 9.5 8.8  MG -- -- --  PHOS -- -- --   Liver Function Tests:  Lab 12/06/11 1943  AST 19  ALT 21  ALKPHOS 118*  BILITOT 0.3  PROT 5.9*    ALBUMIN 2.9*   No results found for this basename: LIPASE:5,AMYLASE:5 in the last 168 hours No results found for this basename: AMMONIA:5 in the last 168 hours CBC:  Lab 12/10/11 0356 12/09/11 0405 12/07/11 0820 12/06/11 1943  WBC 11.7* 11.8* 14.4* 11.7*  NEUTROABS -- -- -- --  HGB 12.5* 12.7* 13.3 12.1*  HCT 36.5* 37.7* 40.0 36.0*  MCV 88.6 88.7 89.3 89.6  PLT 168 193 201 192   Cardiac Enzymes: No results found for this basename: CKTOTAL:5,CKMB:5,CKMBINDEX:5,TROPONINI:5 in the last 168 hours BNP: BNP (last 3 results)  Basename 10/08/11 1200  PROBNP 8566.0*   CBG:  Lab 12/10/11 0719 12/09/11 2110 12/09/11 1713 12/09/11 1149 12/09/11 0803  GLUCAP 125* 110* 180* 240* 129*    Time coordinating discharge: > 35 minutes  Signed:  Penny Pia  [facility] 12/10/2011, 11:21 AM

## 2012-02-28 ENCOUNTER — Other Ambulatory Visit: Payer: Self-pay | Admitting: Dermatology

## 2012-03-14 ENCOUNTER — Emergency Department (HOSPITAL_COMMUNITY): Payer: Medicare Other

## 2012-03-14 ENCOUNTER — Inpatient Hospital Stay (HOSPITAL_COMMUNITY)
Admission: EM | Admit: 2012-03-14 | Discharge: 2012-03-17 | DRG: 280 | Disposition: A | Discharge status: Nursing Home (Includes ICF) | Payer: Medicare Other | Attending: Interventional Cardiology | Admitting: Interventional Cardiology

## 2012-03-14 ENCOUNTER — Encounter (HOSPITAL_COMMUNITY): Payer: Self-pay | Admitting: Neurology

## 2012-03-14 DIAGNOSIS — C801 Malignant (primary) neoplasm, unspecified: Secondary | ICD-10-CM | POA: Diagnosis present

## 2012-03-14 DIAGNOSIS — I4891 Unspecified atrial fibrillation: Secondary | ICD-10-CM | POA: Diagnosis present

## 2012-03-14 DIAGNOSIS — N289 Disorder of kidney and ureter, unspecified: Secondary | ICD-10-CM | POA: Diagnosis present

## 2012-03-14 DIAGNOSIS — Z8249 Family history of ischemic heart disease and other diseases of the circulatory system: Secondary | ICD-10-CM

## 2012-03-14 DIAGNOSIS — Z79899 Other long term (current) drug therapy: Secondary | ICD-10-CM

## 2012-03-14 DIAGNOSIS — Z85820 Personal history of malignant melanoma of skin: Secondary | ICD-10-CM

## 2012-03-14 DIAGNOSIS — I5021 Acute systolic (congestive) heart failure: Secondary | ICD-10-CM | POA: Diagnosis present

## 2012-03-14 DIAGNOSIS — R32 Unspecified urinary incontinence: Secondary | ICD-10-CM | POA: Diagnosis present

## 2012-03-14 DIAGNOSIS — I10 Essential (primary) hypertension: Secondary | ICD-10-CM | POA: Diagnosis present

## 2012-03-14 DIAGNOSIS — C61 Malignant neoplasm of prostate: Secondary | ICD-10-CM | POA: Diagnosis present

## 2012-03-14 DIAGNOSIS — Z66 Do not resuscitate: Secondary | ICD-10-CM | POA: Diagnosis present

## 2012-03-14 DIAGNOSIS — Z888 Allergy status to other drugs, medicaments and biological substances status: Secondary | ICD-10-CM

## 2012-03-14 DIAGNOSIS — Z794 Long term (current) use of insulin: Secondary | ICD-10-CM

## 2012-03-14 DIAGNOSIS — E119 Type 2 diabetes mellitus without complications: Secondary | ICD-10-CM | POA: Diagnosis present

## 2012-03-14 DIAGNOSIS — E039 Hypothyroidism, unspecified: Secondary | ICD-10-CM | POA: Diagnosis present

## 2012-03-14 DIAGNOSIS — Z23 Encounter for immunization: Secondary | ICD-10-CM

## 2012-03-14 DIAGNOSIS — I509 Heart failure, unspecified: Secondary | ICD-10-CM | POA: Diagnosis present

## 2012-03-14 DIAGNOSIS — D649 Anemia, unspecified: Secondary | ICD-10-CM | POA: Diagnosis present

## 2012-03-14 DIAGNOSIS — I4892 Unspecified atrial flutter: Secondary | ICD-10-CM | POA: Diagnosis present

## 2012-03-14 DIAGNOSIS — I214 Non-ST elevation (NSTEMI) myocardial infarction: Principal | ICD-10-CM

## 2012-03-14 HISTORY — DX: Chronic kidney disease, unspecified: N18.9

## 2012-03-14 HISTORY — DX: Unspecified atrial fibrillation: I48.91

## 2012-03-14 HISTORY — DX: Malignant neoplasm of prostate: C61

## 2012-03-14 LAB — BASIC METABOLIC PANEL
CO2: 27 mEq/L (ref 19–32)
Calcium: 9.2 mg/dL (ref 8.4–10.5)
Chloride: 102 mEq/L (ref 96–112)
GFR calc Af Amer: 42 mL/min — ABNORMAL LOW (ref >90)
Sodium: 138 mEq/L (ref 135–145)

## 2012-03-14 LAB — CBC WITH DIFFERENTIAL/PLATELET
Basophils Absolute: 0 10*3/uL (ref 0.0–0.1)
Eosinophils Absolute: 0.4 10*3/uL (ref 0.0–0.7)
Lymphocytes Relative: 31 % (ref 12–46)
MCHC: 33.3 g/dL (ref 30.0–36.0)
Monocytes Relative: 10 % (ref 3–12)
Neutro Abs: 6.5 10*3/uL (ref 1.7–7.7)
Platelets: 202 10*3/uL (ref 150–400)
RDW: 15.6 % — ABNORMAL HIGH (ref 11.5–15.5)
WBC: 11.8 10*3/uL — ABNORMAL HIGH (ref 4.0–10.5)

## 2012-03-14 LAB — URINALYSIS, ROUTINE W REFLEX MICROSCOPIC
Bilirubin Urine: NEGATIVE
Leukocytes, UA: NEGATIVE
Nitrite: NEGATIVE
Specific Gravity, Urine: 1.018 (ref 1.005–1.030)
pH: 6 (ref 5.0–8.0)

## 2012-03-14 LAB — CBC
HCT: 35.5 % — ABNORMAL LOW (ref 39.0–52.0)
MCV: 89.2 fL (ref 78.0–100.0)
Platelets: 195 10*3/uL (ref 150–400)
RBC: 3.98 MIL/uL — ABNORMAL LOW (ref 4.22–5.81)
WBC: 11.2 10*3/uL — ABNORMAL HIGH (ref 4.0–10.5)

## 2012-03-14 LAB — TROPONIN I
Troponin I: 0.93 ng/mL (ref <0.30)
Troponin I: 1.68 ng/mL (ref <0.30)

## 2012-03-14 LAB — PRO B NATRIURETIC PEPTIDE: Pro B Natriuretic peptide (BNP): 13433 pg/mL — ABNORMAL HIGH (ref 0–450)

## 2012-03-14 MED ORDER — ONDANSETRON HCL 4 MG/2ML IJ SOLN: 4.0000 mg | Freq: Four times a day (QID) | INTRAMUSCULAR | Status: DC | PRN

## 2012-03-14 MED ORDER — NITROGLYCERIN 0.4 MG SL SUBL: 0.4000 mg | SUBLINGUAL_TABLET | SUBLINGUAL | Status: DC | PRN

## 2012-03-14 MED ORDER — ACETAMINOPHEN 325 MG PO TABS: 650.0000 mg | ORAL_TABLET | ORAL | Status: DC | PRN

## 2012-03-14 MED ORDER — ASPIRIN EC 81 MG PO TBEC
81.0000 mg | DELAYED_RELEASE_TABLET | Freq: Every day | ORAL | Status: DC
  Administered 2012-03-15 – 2012-03-17 (×3): 81 mg via ORAL
  Filled 2012-03-14 (×3): qty 1

## 2012-03-14 MED ORDER — HEPARIN (PORCINE) IN NACL 100-0.45 UNIT/ML-% IJ SOLN
1400.0000 [IU]/h | INTRAMUSCULAR | Status: AC
  Administered 2012-03-14: 1000 [IU]/h via INTRAVENOUS
  Administered 2012-03-15: 1250 [IU]/h via INTRAVENOUS
  Administered 2012-03-16 (×2): 1400 [IU]/h via INTRAVENOUS
  Filled 2012-03-14 (×4): qty 250

## 2012-03-14 MED ORDER — ATORVASTATIN CALCIUM 20 MG PO TABS
20.0000 mg | ORAL_TABLET | Freq: Every day | ORAL | Status: DC
  Administered 2012-03-15 – 2012-03-16 (×2): 20 mg via ORAL
  Filled 2012-03-14 (×3): qty 1

## 2012-03-14 NOTE — ED Notes (Signed)
Per EMS- Pt comes from [facility]. Has been having cp x 1 day. About 1 hr ago cp 8/10 "feels like hornet is stinging me". At current 0/10 CP. Given 324 aspirin. No nitro given. EKG showing left bundle branch block. BP 160/90, HR 70, 98%. Skin warm and dry. A x 4.

## 2012-03-14 NOTE — ED Notes (Signed)
Up on monitor with RN. 

## 2012-03-14 NOTE — ED Notes (Signed)
Elevated troponin called to EDP

## 2012-03-14 NOTE — ED Notes (Signed)
VSS, card MD into room.

## 2012-03-14 NOTE — ED Provider Notes (Signed)
Is 78 year old male does not have a history of documented coronary artery disease, he had a spell last night for one to 2 hours and then again a spell this afternoon for one to 2 hours of a mild dull vague chest pressure tightness feeling without associated symptoms or radiation. He is now pain free and has some mild EKG changes compared to previous ECG.  Hurman Horn, MD 03/15/12 [phone]

## 2012-03-14 NOTE — ED Notes (Signed)
Alert, NAD, calm, interactive, skin W&D, resps e/u, speaking in clear complete sentences, (denies: pain sob nausea or dizziness). VSS.

## 2012-03-14 NOTE — ED Notes (Signed)
At [facility] with EMT to use urinal, unable to void, no sample obtained.

## 2012-03-14 NOTE — ED Notes (Signed)
Per EMS  

## 2012-03-14 NOTE — ED Provider Notes (Signed)
History     CSN: [id number]  Arrival date & time 03/14/12  4098   First MD Initiated Contact with Patient 03/14/12 1743      Chief Complaint  Patient presents with  . Chest Pain    (Consider location/radiation/quality/duration/timing/severity/associated sxs/prior treatment) Patient is a 78 y.o. male presenting with chest pain. The history is provided by the patient and a relative.  Chest Pain The chest pain began 3 - 5 hours ago. Chest pain occurs intermittently. The chest pain is resolved. At its most intense, the pain is at 7/10. The pain is currently at 0/10. The severity of the pain is moderate. The quality of the pain is described as tightness ("Lazy dull pain"). The pain does not radiate. Pertinent negatives for primary symptoms include no fever, no fatigue, no syncope, no shortness of breath, no cough, no wheezing, no palpitations, no abdominal pain, no nausea, no vomiting, no dizziness and no altered mental status. He tried aspirin for the symptoms. Risk factors include male gender.     Past Medical History  Diagnosis Date  . Prostate ca   . Hypothyroidism   . Hyperglycemia   . DMII (diabetes mellitus, type 2)   . Prostate cancer, primary, with metastasis from prostate to other site 12/06/2011  . Prostate cancer metastatic to multiple sites   . A-fib   . Renal disorder     Past Surgical History  Procedure Date  . Melanoma removed from left leg 1971    x 14  . Left cataract extraction     No family history on file.  History  Substance Use Topics  . Smoking status: Never Smoker   . Smokeless tobacco: Never Used  . Alcohol Use: No      Review of Systems  Constitutional: Negative for fever and fatigue.  Respiratory: Negative for cough, shortness of breath and wheezing.   Cardiovascular: Positive for chest pain. Negative for palpitations and syncope.  Gastrointestinal: Negative for nausea, vomiting, abdominal pain and diarrhea.  Neurological: Negative for  dizziness and headaches.  Psychiatric/Behavioral: Negative for altered mental status.  All other systems reviewed and are negative.    Allergies  Accupril and Naproxen  Home Medications   Current Outpatient Rx  Name Route Sig Dispense Refill  . ACETAMINOPHEN 325 MG PO TABS Oral Take 2 tablets (650 mg total) by mouth every 6 (six) hours as needed (or Fever >/= 101).    . ASPIRIN 81 MG PO TABS Oral Take 81 mg by mouth daily.    Marland Kitchen VITAMIN D3 1000 UNITS PO CHEW Oral Chew 2 tablets by mouth daily.    Marland Kitchen DILTIAZEM HCL ER COATED BEADS 180 MG PO CP24 Oral Take 1 capsule (180 mg total) by mouth daily.    Marland Kitchen ENSURE COMPLETE PO LIQD Oral Take 237 mLs by mouth daily at 12 noon. 15 Bottle 0  . HYDROXYZINE PAMOATE 25 MG PO CAPS Oral Take 25 mg by mouth 3 (three) times daily as needed. Sleep    . INSULIN GLARGINE 100 UNIT/ML [place] SOLN Subcutaneous Inject 10 Units into the skin daily. 10 mL   . INSULIN LISPRO (HUMAN) 100 UNIT/ML [place] SOLN Subcutaneous Inject 3 Units into the skin 3 (three) times daily before meals.    Marland Kitchen LEVOTHYROXINE SODIUM 112 MCG PO TABS Oral Take 112 mcg by mouth daily.    Marland Kitchen LORAZEPAM 0.5 MG PO TABS Oral Take 0.5 mg by mouth every 8 (eight) hours. Sleeping    . METOPROLOL TARTRATE 50 MG  PO TABS Oral Take 1 tablet (50 mg total) by mouth 2 (two) times daily.    Bernadette Hoit SODIUM 8.6-50 MG PO TABS Oral Take 1 tablet by mouth at bedtime as needed.    Marland Kitchen TAMSULOSIN HCL 0.4 MG PO CAPS Oral Take 0.4 mg by mouth daily.      BP 153/75  Temp 98.9 F (37.2 C) (Oral)  Resp 20  SpO2 100%  Physical Exam  Nursing note and vitals reviewed. Constitutional: He is oriented to person, place, and time. He appears well-developed and well-nourished. No distress.       Pt is hard of hearing and has bilateral hearing aids in place  HENT:  Head: Normocephalic and atraumatic.  Eyes: Pupils are equal, round, and reactive to light.  Cardiovascular: Normal rate and normal heart sounds.  An  irregular rhythm present.  Pulmonary/Chest: Effort normal and breath sounds normal. No respiratory distress.  Abdominal: Soft. He exhibits no distension. There is no tenderness.  Musculoskeletal: Normal range of motion.  Neurological: He is alert and oriented to person, place, and time.  Skin: Skin is warm and dry.  Psychiatric: He has a normal mood and affect.    ED Course  Procedures (including critical care time)  Labs Reviewed  TROPONIN I - Abnormal; Notable for the following:    Troponin I 0.93 (*)     All other components within normal limits  BASIC METABOLIC PANEL - Abnormal; Notable for the following:    Glucose, Bld 165 (*)     BUN 37 (*)     Creatinine, Ser 1.55 (*)     GFR calc non Af Amer 36 (*)     GFR calc Af Amer 42 (*)     All other components within normal limits  CBC - Abnormal; Notable for the following:    WBC 11.2 (*)     RBC 3.98 (*)     Hemoglobin 11.6 (*)     HCT 35.5 (*)     All other components within normal limits  TROPONIN I - Abnormal; Notable for the following:    Troponin I 1.68 (*)     All other components within normal limits  CBC WITH DIFFERENTIAL - Abnormal; Notable for the following:    WBC 11.8 (*)     RBC 3.96 (*)     Hemoglobin 11.9 (*)     HCT 35.7 (*)     RDW 15.6 (*)     Monocytes Absolute 1.2 (*)     All other components within normal limits  PRO B NATRIURETIC PEPTIDE - Abnormal; Notable for the following:    Pro B Natriuretic peptide (BNP) 13433.0 (*)     All other components within normal limits  URINALYSIS, ROUTINE W REFLEX MICROSCOPIC  TROPONIN I  TROPONIN I  TSH  HEMOGLOBIN A1C  HEPARIN LEVEL (UNFRACTIONATED)  CBC   No results found.  Date: 03/14/2012  Rate: 82  Rhythm: atrial flutter with intermittent conduction  QRS Axis: normal  Intervals: normal  ST/T Wave abnormalities: nonspecific T wave changes: V5-V6  Conduction Disutrbances:nonspecific intraventricular conduction delay  Narrative Interpretation: A  flutter with intermittent conduction with new nonspecific T wave changes in lateral leads.   Old EKG Reviewed: changes noted: New t-wave inversion noted in V5-V6     1. NSTEMI (non-ST elevated myocardial infarction)       MDM  6:03 PM Pt seen and examined. Pt with two episodes of chest pain that each lasted about 2-3 hours.  Today pain did not subside until given ASA 324 mg by EMS. Pt then had complete resolution of his chest pain. Will work up for ACS.  Pt with elevated first troponin. Will consult [facility] for admission as patient is followed by that practice.   Pt to be admitted to Cardiology. Pt likely having NSTEMI and will be started on heparin by cardiology.        Daleen Bo, MD 03/14/12 [phone]

## 2012-03-14 NOTE — ED Notes (Signed)
Dr. [place] Lions notified of new lab results troponin and BNP elevated. Status to change to DNR, report called to 2000 primary receiving RN, no changes. Preparing to transport to floor.

## 2012-03-14 NOTE — Progress Notes (Signed)
ANTICOAGULATION CONSULT NOTE - Initial Consult  Pharmacy Consult for Heparin Indication: chest pain/ACS  Allergies  Allergen Reactions  . Accupril (Quinapril Hcl) Other (See Comments)    unknown  . Naproxen Rash    Patient Measurements:   Heparin Dosing Weight: 82 kg  Vital Signs: Temp: 98.9 F (37.2 C) (10/18 1752) Temp src: Oral (10/18 1752) BP: 125/63 mmHg (10/18 2045) Pulse Rate: 79  (10/18 2045)  Labs:  Basename 03/14/12 1840  HGB 11.6*  HCT 35.5*  PLT 195  APTT --  LABPROT --  INR --  HEPARINUNFRC --  CREATININE 1.55*  CKTOTAL --  CKMB --  TROPONINI 0.93*    The CrCl is unknown because both a height and weight (above a minimum accepted value) are required for this calculation.   Medical History: Past Medical History  Diagnosis Date  . Prostate cancer   . Hypothyroidism   . Hyperglycemia   . DMII (diabetes mellitus, type 2)   . Prostate cancer, primary, with metastasis from prostate to other site 12/06/2011  . Prostate cancer metastatic to multiple sites   . A-fib   . CKD (chronic kidney disease)      Assessment: 78 yo male who presents with chest pain to start on heparin. First troponin elevated at 0.93. Per H&P, pt denies being on a blood thinner. SCr 1.55 (Est CrCl ~ 28 ml/min). Hgb 11.6, plt 195.   Goal of Therapy:  Heparin level 0.3-0.7 units/ml Monitor platelets by anticoagulation protocol: Yes   Plan:  1. Initiate heparin infusion at 1000 units/hr (=10 ml/hr) 2. Heparin level in 8h at 0700 3. Daily heparin level and CBC  4. Baseline PT/INR    Crist Fat L 03/14/2012,9:45 PM

## 2012-03-14 NOTE — H&P (Signed)
CARDIOLOGY ADMISSION NOTE  Patient ID: [name] MRN: [id number] DOB/AGE: Jan 12, 1914 78 y.o.  Admit date: 03/14/2012 Primary Physician   Sissy Hoff, MD Primary Cardiologist   None Chief Complaint    Chest pain  HPI:  The patient has no prior history of coronary disease. I do see that he has had atrial flutter on previous EKGs and he is in this rhythm now. He doesn't notice this, he's not any palpitations, presyncope or syncope. He doesn't report ever being on a blood thinner.  He reports having chest pain yesterday and again today. He points to his mid chest. He says it has been dull in mild.  He said there's been no radiation. He denies any associated symptoms such as not see a vomiting or diaphoresis. He's had no new shortness of breath, PND or orthopnea. He was sent to the emergency room where he is noted to have a mildly elevated troponin as below. He does have some new lateral T-wave inversion on EKG. He's currently pain-free.  He says that typically at his retirement home he walks and participates in exercise regimens without significant limitations.   Past Medical History  Diagnosis Date  . Prostate ca   . Hypothyroidism   . Hyperglycemia   . DMII (diabetes mellitus, type 2)   . Prostate cancer, primary, with metastasis from prostate to other site 12/06/2011  . Prostate cancer metastatic to multiple sites   . A-fib   . Renal disorder     Past Surgical History  Procedure Date  . Melanoma removed from left leg 1971    x 14  . Left cataract extraction   . Prostate surgery   . Sinus exploration     Allergies  Allergen Reactions  . Accupril (Quinapril Hcl) Other (See Comments)    unknown  . Naproxen Rash   No current facility-administered medications on file prior to encounter.   Current Outpatient Prescriptions on File Prior to Encounter  Medication Sig Dispense Refill  . diltiazem (CARDIZEM CD) 180 MG 24 hr capsule Take 1 capsule (180 mg total) by mouth  daily.      . furosemide (LASIX) 40 MG tablet Take 40 mg by mouth daily.      . insulin lispro (HUMALOG) 100 UNIT/ML injection Inject 3 Units into the skin 3 (three) times daily before meals.      Marland Kitchen levothyroxine (SYNTHROID, LEVOTHROID) 112 MCG tablet Take 112 mcg by mouth daily.      Marland Kitchen LORazepam (ATIVAN) 0.5 MG tablet Take 0.5 mg by mouth at bedtime as needed. Sleeping      . metoprolol (LOPRESSOR) 50 MG tablet Take 1 tablet (50 mg total) by mouth 2 (two) times daily.      . Tamsulosin HCl (FLOMAX) 0.4 MG CAPS Take 0.4 mg by mouth daily.      . feeding supplement (ENSURE COMPLETE) LIQD Take 237 mLs by mouth daily at 12 noon.  15 Bottle  0   History   Social History  . Marital Status: Widower    Spouse Name: N/A    Number of Children: 3  . Years of Education: N/A   Occupational History  . Retired Programmer, multimedia   Social History Main Topics  . Smoking status: Never Smoker   . Smokeless tobacco: Never Used  . Alcohol Use: No    Social History Narrative   Lives at [facility].  Two living children.  He is a widower.      Family history:  Positive for first-degree relatives with later onset coronary artery disease. No early onset heart disease.  ROS:  Urinary incontinence. Otherwise as stated in the HPI and negative for all other systems.   Physical Exam: Blood pressure 125/63, pulse 79, temperature 98.9 F (37.2 C), temperature source Oral, resp. rate 15, SpO2 100.00%.  GENERAL:  Well appearing for his age HEENT:  Pupils equal round and reactive, fundi not visualized, oral mucosa unremarkable NECK:  No jugular venous distention, waveform within normal limits, carotid upstroke brisk and symmetric, no bruits, no thyromegaly LYMPHATICS:  No cervical, inguinal adenopathy LUNGS:  Clear to auscultation bilaterally BACK:  No CVA tenderness CHEST:  Unremarkable HEART:  PMI not displaced or sustained,S1 and S2 within normal limits, no S3, no S4, no clicks, no rubs, no murmurs ABD:   Flat, positive bowel sounds normal in frequency in pitch, no bruits, no rebound, no guarding, no midline pulsatile mass, no hepatomegaly, no splenomegaly EXT:  2 plus pulses throughout, no edema, no cyanosis no clubbing, right lymphedema, left leg edema SKIN:  No rashes no nodules NEURO:  Cranial nerves II through XII grossly intact, motor grossly intact throughout PSYCH:  Cognitively intact, oriented to person place and time  Labs: Lab Results  Component Value Date   BUN 37* 03/14/2012   Lab Results  Component Value Date   CREATININE 1.55* 03/14/2012   Lab Results  Component Value Date   NA 138 03/14/2012   K 4.5 03/14/2012   CL 102 03/14/2012   CO2 27 03/14/2012   Lab Results  Component Value Date   TROPONINI 0.93* 03/14/2012   Lab Results  Component Value Date   WBC 11.2* 03/14/2012   HGB 11.6* 03/14/2012   HCT 35.5* 03/14/2012   MCV 89.2 03/14/2012   PLT 195 03/14/2012     Radiology:   CXR:  Small left and trace right pleural effusions, unchanged.  Associated patchy left lobe opacity, likely atelectasis.   EKG:  Atrial flutter with variable conduction, ventricular rate 82, lateral T-wave inversions new compared to previous EKGs.  03/14/2012  ASSESSMENT AND PLAN:    NQWMI The patient is having a non-Q-wave myocardial infarction. I will start low-dose heparin. I will add Imdur to his regimen as he is currently pain-free. I will continue his other medicines as listed. I would like, given his advanced age and renal insufficiency, to manage him medically unless he has recurrent unstable symptoms.  I will start a statin and check a lipid profile.  Check an echocardiogram.    Atrial flutter/fib As I review old EKGs this is a chronic problem. He is at high risk for thromboembolic events given his diabetes, advanced age, hypertension, apparent vascular disease. I will start him on heparin and I would set suggest possibly Eliquis  Hypothyroidism I will check a  TSH  Diabetes Continue current insulin and SSI  CKD Follow creat  Anemia Mild  Signed: Rollene Rotunda 03/14/2012, 9:10 PM

## 2012-03-14 NOTE — ED Notes (Signed)
Pt updated, denies pain or other sx, denies questions, given happy meal/snack soda, NAD, calm, interactive, pending admission orders and bed assignment, VSS. Pt updated.

## 2012-03-15 ENCOUNTER — Encounter (HOSPITAL_COMMUNITY): Payer: Self-pay

## 2012-03-15 LAB — GLUCOSE, CAPILLARY
Glucose-Capillary: 108 mg/dL — ABNORMAL HIGH (ref 70–99)
Glucose-Capillary: 148 mg/dL — ABNORMAL HIGH (ref 70–99)
Glucose-Capillary: 304 mg/dL — ABNORMAL HIGH (ref 70–99)
Glucose-Capillary: 81 mg/dL (ref 70–99)

## 2012-03-15 LAB — CBC
HCT: 35.3 % — ABNORMAL LOW (ref 39.0–52.0)
Hemoglobin: 11.6 g/dL — ABNORMAL LOW (ref 13.0–17.0)
MCH: 29.4 pg (ref 26.0–34.0)
MCHC: 32.9 g/dL (ref 30.0–36.0)
MCHC: 33.7 g/dL (ref 30.0–36.0)
MCV: 89.4 fL (ref 78.0–100.0)
Platelets: 196 10*3/uL (ref 150–400)
RDW: 15.5 % (ref 11.5–15.5)
RDW: 15.2 % (ref 11.5–15.5)
WBC: 15.6 10*3/uL — ABNORMAL HIGH (ref 4.0–10.5)

## 2012-03-15 LAB — TROPONIN I
Troponin I: 1.61 ng/mL (ref <0.30)
Troponin I: 1.53 ng/mL (ref <0.30)

## 2012-03-15 LAB — HEMOGLOBIN A1C: Hgb A1c MFr Bld: 8.2 % — ABNORMAL HIGH (ref <5.7)

## 2012-03-15 LAB — HEPARIN LEVEL (UNFRACTIONATED): Heparin Unfractionated: 0.37 IU/mL (ref 0.30–0.70)

## 2012-03-15 MED ORDER — INSULIN GLARGINE 100 UNIT/ML ~~LOC~~ SOLN
20.0000 [IU] | Freq: Every day | SUBCUTANEOUS | Status: DC
  Administered 2012-03-15 – 2012-03-16 (×3): 20 [IU] via SUBCUTANEOUS

## 2012-03-15 MED ORDER — DILTIAZEM HCL ER COATED BEADS 180 MG PO CP24
180.0000 mg | ORAL_CAPSULE | Freq: Every day | ORAL | Status: DC
Start: 2012-03-15 — End: 2012-03-17
  Administered 2012-03-15 – 2012-03-17 (×3): 180 mg via ORAL
  Filled 2012-03-15 (×3): qty 1

## 2012-03-15 MED ORDER — OCUVITE PO TABS
1.0000 | ORAL_TABLET | Freq: Every day | ORAL | Status: DC
  Administered 2012-03-15 – 2012-03-17 (×3): 1 via ORAL
  Filled 2012-03-15 (×3): qty 1

## 2012-03-15 MED ORDER — LEVOTHYROXINE SODIUM 112 MCG PO TABS
112.0000 ug | ORAL_TABLET | Freq: Every day | ORAL | Status: DC
  Administered 2012-03-15 – 2012-03-17 (×3): 112 ug via ORAL
  Filled 2012-03-15 (×4): qty 1

## 2012-03-15 MED ORDER — FUROSEMIDE 40 MG PO TABS
40.0000 mg | ORAL_TABLET | Freq: Every day | ORAL | Status: DC
  Filled 2012-03-15: qty 1

## 2012-03-15 MED ORDER — ASPIRIN 81 MG PO CHEW: 81.0000 mg | CHEWABLE_TABLET | Freq: Every day | ORAL | Status: DC

## 2012-03-15 MED ORDER — INFLUENZA VIRUS VACC SPLIT PF IM SUSP
0.5000 mL | INTRAMUSCULAR | Status: AC
  Administered 2012-03-16: 0.5 mL via INTRAMUSCULAR
  Filled 2012-03-15: qty 0.5

## 2012-03-15 MED ORDER — TAMSULOSIN HCL 0.4 MG PO CAPS
0.4000 mg | ORAL_CAPSULE | Freq: Every day | ORAL | Status: DC
  Administered 2012-03-15 – 2012-03-17 (×3): 0.4 mg via ORAL
  Filled 2012-03-15 (×3): qty 1

## 2012-03-15 MED ORDER — INSULIN ASPART 100 UNIT/ML ~~LOC~~ SOLN
7.0000 [IU] | Freq: Once | SUBCUTANEOUS | Status: AC
  Administered 2012-03-15: 7 [IU] via SUBCUTANEOUS

## 2012-03-15 MED ORDER — ISOSORBIDE MONONITRATE ER 30 MG PO TB24
30.0000 mg | ORAL_TABLET | Freq: Every day | ORAL | Status: DC
  Administered 2012-03-15 – 2012-03-17 (×3): 30 mg via ORAL
  Filled 2012-03-15 (×3): qty 1

## 2012-03-15 MED ORDER — ENSURE COMPLETE PO LIQD
237.0000 mL | Freq: Every day | ORAL | Status: DC
  Administered 2012-03-15 – 2012-03-17 (×3): 237 mL via ORAL

## 2012-03-15 MED ORDER — INSULIN ASPART 100 UNIT/ML ~~LOC~~ SOLN
0.0000 [IU] | Freq: Three times a day (TID) | SUBCUTANEOUS | Status: DC
  Administered 2012-03-15 (×2): 1 [IU] via SUBCUTANEOUS
  Administered 2012-03-16: 2 [IU] via SUBCUTANEOUS
  Administered 2012-03-16 – 2012-03-17 (×2): 1 [IU] via SUBCUTANEOUS

## 2012-03-15 MED ORDER — FUROSEMIDE 10 MG/ML IJ SOLN
40.0000 mg | Freq: Every day | INTRAMUSCULAR | Status: DC
  Administered 2012-03-15 – 2012-03-16 (×2): 40 mg via INTRAVENOUS
  Filled 2012-03-15 (×3): qty 4

## 2012-03-15 MED ORDER — INSULIN ASPART 100 UNIT/ML ~~LOC~~ SOLN
3.0000 [IU] | Freq: Three times a day (TID) | SUBCUTANEOUS | Status: DC
  Administered 2012-03-15 – 2012-03-17 (×6): 3 [IU] via SUBCUTANEOUS

## 2012-03-15 MED ORDER — ACETAMINOPHEN 500 MG PO TABS: 500.0000 mg | ORAL_TABLET | ORAL | Status: DC | PRN

## 2012-03-15 MED ORDER — METOPROLOL TARTRATE 50 MG PO TABS
50.0000 mg | ORAL_TABLET | Freq: Two times a day (BID) | ORAL | Status: DC
  Administered 2012-03-15 – 2012-03-16 (×4): 50 mg via ORAL
  Filled 2012-03-15 (×7): qty 1

## 2012-03-15 NOTE — Progress Notes (Signed)
ANTICOAGULATION CONSULT NOTE - Follow Up Consult  Pharmacy Consult for Heparin Indication: NSTEMI  Allergies  Allergen Reactions  . Accupril (Quinapril Hcl) Other (See Comments)    unknown  . Naproxen Rash    Patient Measurements: Height: 5\' 10"  (177.8 cm) Weight: 184 lb 1.4 oz (83.5 kg) IBW/kg (Calculated) : 73  Heparin Dosing Weight: 82 kg  Vital Signs: Temp: 97.4 F (36.3 C) (10/19 0424) Temp src: Oral (10/19 0424) BP: 133/76 mmHg (10/19 0424) Pulse Rate: 82  (10/19 0424)  Labs:  Basename 03/15/12 0855 03/15/12 0259 03/14/12 2157 03/14/12 1840  HGB 11.6* -- 11.9* --  HCT 35.3* -- 35.7* 35.5*  PLT 186 -- 202 195  APTT -- -- -- --  LABPROT -- -- -- --  INR -- -- -- --  HEPARINUNFRC <0.10* -- -- --  CREATININE -- -- -- 1.55*  CKTOTAL -- -- -- --  CKMB -- -- -- --  TROPONINI 1.53* 1.61* 1.68* --    Estimated Creatinine Clearance: 28.8 ml/min (by C-G formula based on Cr of 1.55).   Medications:  Infusions:    . heparin 1,000 Units/hr (03/14/12 2227)    Assessment: 78 yo male on heparin for an NSTEMI for 48 hours. Heparin level is undetectable at 1000 units/hr.   As of ~11:00, patient has no access as IV came out. RN reports patient had some bleeding at IV site likely due to aspirin with subtherapeutic heparin level. No other bleeding noted, CBC is stable.  Goal of Therapy:  Heparin level 0.3-0.7 units/ml Monitor platelets by anticoagulation protocol: Yes   Plan:  -RN to call pharmacy at [phone] when IV access obtained -When resumed, increase heparin drip to 1250 units/hr  -Heparin level 8 hours after rate change -Daily heparin level and CBC while on heparin   [facility], [place].D., BCPS Clinical Pharmacist Pager: [phone] 03/15/2012 11:45 AM

## 2012-03-15 NOTE — Progress Notes (Addendum)
SUBJECTIVE:  No further chest pain since yesterday afternoon.  OBJECTIVE:   Vitals:   Filed Vitals:   03/14/12 2300 03/14/12 2315 03/14/12 2355 03/15/12 0424  BP: 141/66 135/66 139/64 133/76  Pulse: 77 86 75 82  Temp:   98.3 F (36.8 C) 97.4 F (36.3 C)  TempSrc:   Oral Oral  Resp: 29 16 16 18   Height:   5\' 10"  (1.778 m)   Weight:   83.3 kg (183 lb 10.3 oz) 83.5 kg (184 lb 1.4 oz)  SpO2: 100% 100% 99% 97%   I&O's:   Intake/Output Summary (Last 24 hours) at 03/15/12 0816 Last data filed at 03/15/12 0300  Gross per 24 hour  Intake      0 ml  Output    200 ml  Net   -200 ml   TELEMETRY: Reviewed telemetry pt in AFib, atrial flutter, rate controlled:     PHYSICAL EXAM General: Well developed, well nourished, in no acute distress, pleasant Head:    Normal cephalic and atramatic  Lungs:  No wheezing Heart:   HRRR S1 S2, premature beats Abdomen:  abdomen soft and non-tender Msk:  . Normal strength and tone for age. Extremities:  1+ left leg edema. Right leg scarred from prior surgery Neuro: Alert and oriented X 3. Psych:  Normal affect, responds appropriately   LABS: Basic Metabolic Panel:  Basename 03/14/12 1840  NA 138  K 4.5  CL 102  CO2 27  GLUCOSE 165*  BUN 37*  CREATININE 1.55*  CALCIUM 9.2  MG --  PHOS --   Liver Function Tests: No results found for this basename: AST:2,ALT:2,ALKPHOS:2,BILITOT:2,PROT:2,ALBUMIN:2 in the last 72 hours No results found for this basename: LIPASE:2,AMYLASE:2 in the last 72 hours CBC:  Basename 03/14/12 2157 03/14/12 1840  WBC 11.8* 11.2*  NEUTROABS 6.5 --  HGB 11.9* 11.6*  HCT 35.7* 35.5*  MCV 90.2 89.2  PLT 202 195   Cardiac Enzymes:  Basename 03/15/12 0259 03/14/12 2157 03/14/12 1840  CKTOTAL -- -- --  CKMB -- -- --  CKMBINDEX -- -- --  TROPONINI 1.61* 1.68* 0.93*   BNP: No components found with this basename: POCBNP:3 D-Dimer: No results found for this basename: DDIMER:2 in the last 72 hours Hemoglobin  A1C: No results found for this basename: HGBA1C in the last 72 hours Fasting Lipid Panel: No results found for this basename: CHOL,HDL,LDLCALC,TRIG,CHOLHDL,LDLDIRECT in the last 72 hours Thyroid Function Tests: No results found for this basename: TSH,T4TOTAL,FREET3,T3FREE,THYROIDAB in the last 72 hours Anemia Panel: No results found for this basename: VITAMINB12,FOLATE,FERRITIN,TIBC,IRON,RETICCTPCT in the last 72 hours Coag Panel:   Lab Results  Component Value Date   INR 1.1 12/27/2007    RADIOLOGY: Dg Chest 2 View  03/14/2012  *RADIOLOGY REPORT*  Clinical Data: Chest discomfort  CHEST - 2 VIEW  Comparison: 12/07/2011  Findings: Small left pleural effusion, unchanged.  Associated patchy left lower lobe opacity, likely atelectasis.  Trace right pleural effusion, unchanged.  No definite pneumothorax is seen.  Apparent lucency along the lateral left lower lung is favored to reflect a skin fold.  Chronic interstitial markings without frank interstitial edema.  Stable cardiomegaly.  Mild degenerative changes of the visualized thoracolumbar spine.  IMPRESSION: Small left and trace right pleural effusions, unchanged.  Associated patchy left lobe opacity, likely atelectasis.   Original Report Authenticated By: Charline Bills, M.D.       ASSESSMENT: NSTEMI: low CK, MB troponin.    PLAN:  Agree with medical therapy for MI given age  and renal insufficiency.  Continue heparin IV for 48 hours.  Obtain echo to check LV function.  THere is a component of heart failure based on the chest xray and the LE edema.  Will give a dose of IV Lasix as well today.  Start IMdur to see if this will help prevent any further chest pain.  Discussed cath with the patient as an option only if there are refractory symptoms.  He is in agreement and is appreciative of the care he has received.  Atrial flutter: rate controlled.  Would not consider coumadin at this time given the patient's age.   Corky Crafts., MD    03/15/2012  8:16 AM

## 2012-03-15 NOTE — Progress Notes (Signed)
UR Completed.  Vangie Bicker [phone] 03/15/2012

## 2012-03-15 NOTE — ED Provider Notes (Signed)
I saw and evaluated the patient, reviewed the resident's note and I agree with the findings and plan.  Hurman Horn, MD 03/15/12 1534

## 2012-03-15 NOTE — Progress Notes (Signed)
CARDIAC REHAB PHASE I   PRE:  Rate/Rhythm: 74 sinus  BP:  Supine: 124/56     SaO2: 99 RA  MODE:  Ambulation: 500 ft   POST:  Rate/Rhythem: 75  BP:  Supine: 132/72   SaO2: 95 RA  Order received and appreciated.  Chart reviewed.  Pt ambulated 500 ft with assit x1 using rolling walker.  Pt said that he does not use a walker at home, but did very well with it and gait was steady.  Pt had no CP during walk but did feel "a little" upon returning to bed, but it went away quickly.  Reviewed diet, exercise, and restrictions with pt and family.  Pt and family voiced understanding and will share handouts with [facility].  Bed alarm reset and call bell in reach. Fabio Pierce, MA, ACSM RCEP [phone]   Hazle Nordmann

## 2012-03-15 NOTE — Progress Notes (Signed)
  Echocardiogram 2D Echocardiogram has been performed.  [name] FRANCES 03/15/2012, 11:47 AM

## 2012-03-15 NOTE — Progress Notes (Signed)
Hypoglycemic Event  CBG: 50  Treatment: 15 GM carbohydrate snack  Symptoms: None  Follow-up CBG: Time:0655 CBG Result:81  Possible Reasons for Event: Unknown  Comments/MD notified:BS came back up to 81    [name], [name]  Remember to initiate Hypoglycemia Order Set & complete

## 2012-03-15 NOTE — Progress Notes (Signed)
ANTICOAGULATION CONSULT NOTE - Follow Up Consult  Pharmacy Consult for Heparin Indication: NSTEMI  Allergies  Allergen Reactions  . Accupril (Quinapril Hcl) Other (See Comments)    unknown  . Naproxen Rash    Patient Measurements: Height: 5\' 10"  (177.8 cm) Weight: 184 lb 1.4 oz (83.5 kg) IBW/kg (Calculated) : 73  Heparin Dosing Weight: 82 kg  Vital Signs: Temp: 98.7 F (37.1 C) (10/19 2044) Temp src: Oral (10/19 2044) BP: 126/75 mmHg (10/19 2044) Pulse Rate: 71  (10/19 2044)  Labs:  Basename 03/15/12 2137 03/15/12 0855 03/15/12 0259 03/14/12 2157 03/14/12 1840  HGB 11.4* 11.6* -- -- --  HCT 33.8* 35.3* -- 35.7* --  PLT 196 186 -- 202 --  APTT -- -- -- -- --  LABPROT -- -- -- -- --  INR -- -- -- -- --  HEPARINUNFRC 0.37 <0.10* -- -- --  CREATININE -- -- -- -- 1.55*  CKTOTAL -- -- -- -- --  CKMB -- -- -- -- --  TROPONINI -- 1.53* 1.61* 1.68* --    Estimated Creatinine Clearance: 28.8 ml/min (by C-G formula based on Cr of 1.55).   Medications:  Infusions:     . heparin 1,250 Units/hr (03/15/12 1252)    Assessment: 78 yo male on heparin for an NSTEMI for 48 hours. Heparin level is now therapeutic.  Goal of Therapy:  Heparin level 0.3-0.7 units/ml Monitor platelets by anticoagulation protocol: Yes   Plan:  Cont heparin drip at1250 units/hr  F/u am heparin level and CBC   Kashawna Manzer, Pharm.D. Clinical Pharmacist Pager: [phone] 03/15/2012 10:11 PM

## 2012-03-16 LAB — BASIC METABOLIC PANEL
BUN: 34 mg/dL — ABNORMAL HIGH (ref 6–23)
Chloride: 99 mEq/L (ref 96–112)
Creatinine, Ser: 1.57 mg/dL — ABNORMAL HIGH (ref 0.50–1.35)
GFR calc Af Amer: 41 mL/min — ABNORMAL LOW (ref >90)
GFR calc non Af Amer: 36 mL/min — ABNORMAL LOW (ref >90)
Potassium: 4.3 mEq/L (ref 3.5–5.1)

## 2012-03-16 LAB — CBC
HCT: 34.2 % — ABNORMAL LOW (ref 39.0–52.0)
MCHC: 33 g/dL (ref 30.0–36.0)
Platelets: 200 10*3/uL (ref 150–400)
RDW: 15.4 % (ref 11.5–15.5)
WBC: 14.4 10*3/uL — ABNORMAL HIGH (ref 4.0–10.5)

## 2012-03-16 LAB — GLUCOSE, CAPILLARY: Glucose-Capillary: 93 mg/dL (ref 70–99)

## 2012-03-16 LAB — HEPARIN LEVEL (UNFRACTIONATED): Heparin Unfractionated: 0.22 IU/mL — ABNORMAL LOW (ref 0.30–0.70)

## 2012-03-16 NOTE — Progress Notes (Signed)
SUBJECTIVE:  No further chest pain since yesterday afternoon per his report, although it is noted that he had a little after walking yesterday.  OBJECTIVE:   Vitals:   Filed Vitals:   03/15/12 1345 03/15/12 2044 03/16/12 0531 03/16/12 0614  BP: 122/61 126/75 116/47   Pulse: 86 71 55   Temp: 98.6 F (37 C) 98.7 F (37.1 C) 98.1 F (36.7 C)   TempSrc: Oral Oral Oral   Resp: 16 17 18    Height:      Weight:    78.5 kg (173 lb 1 oz)  SpO2: 98% 97% 95%    I&O's:    Intake/Output Summary (Last 24 hours) at 03/16/12 0816 Last data filed at 03/16/12 0500  Gross per 24 hour  Intake    720 ml  Output   2800 ml  Net  -2080 ml   TELEMETRY: Reviewed telemetry pt in AFib, atrial flutter, rate controlled:     PHYSICAL EXAM General: Well developed, well nourished, in no acute distress, pleasant Head:    Normal cephalic and atramatic  Lungs:  No wheezing Heart:   S1 S2, irregularly irregular Abdomen:  abdomen soft and non-tender Msk:  . Normal strength and tone for age. Extremities:  decreased left leg edema. Right leg scarred from prior surgery Neuro: Alert and oriented X 3. Psych:  Normal affect, responds appropriately   LABS: Basic Metabolic Panel:  Basename 03/16/12 0640 03/14/12 1840  NA 138 138  K 4.3 4.5  CL 99 102  CO2 32 27  GLUCOSE 91 165*  BUN 34* 37*  CREATININE 1.57* 1.55*  CALCIUM 9.5 9.2  MG -- --  PHOS -- --   Liver Function Tests: No results found for this basename: AST:2,ALT:2,ALKPHOS:2,BILITOT:2,PROT:2,ALBUMIN:2 in the last 72 hours No results found for this basename: LIPASE:2,AMYLASE:2 in the last 72 hours CBC:  Basename 03/16/12 0640 03/15/12 2137 03/14/12 2157  WBC 14.4* 15.6* --  NEUTROABS -- -- 6.5  HGB 11.3* 11.4* --  HCT 34.2* 33.8* --  MCV 88.8 88.3 --  PLT 200 196 --   Cardiac Enzymes:  Basename 03/15/12 0855 03/15/12 0259 03/14/12 2157  CKTOTAL -- -- --  CKMB -- -- --  CKMBINDEX -- -- --  TROPONINI 1.53* 1.61* 1.68*   BNP: No  components found with this basename: POCBNP:3 D-Dimer: No results found for this basename: DDIMER:2 in the last 72 hours Hemoglobin A1C:  Basename 03/14/12 2157  HGBA1C 8.2*   Fasting Lipid Panel: No results found for this basename: CHOL,HDL,LDLCALC,TRIG,CHOLHDL,LDLDIRECT in the last 72 hours Thyroid Function Tests:  Basename 03/14/12 2157  TSH 3.547  T4TOTAL --  T3FREE --  THYROIDAB --   Anemia Panel: No results found for this basename: VITAMINB12,FOLATE,FERRITIN,TIBC,IRON,RETICCTPCT in the last 72 hours Coag Panel:   Lab Results  Component Value Date   INR 1.1 12/27/2007    RADIOLOGY: Dg Chest 2 View  03/14/2012  *RADIOLOGY REPORT*  Clinical Data: Chest discomfort  CHEST - 2 VIEW  Comparison: 12/07/2011  Findings: Small left pleural effusion, unchanged.  Associated patchy left lower lobe opacity, likely atelectasis.  Trace right pleural effusion, unchanged.  No definite pneumothorax is seen.  Apparent lucency along the lateral left lower lung is favored to reflect a skin fold.  Chronic interstitial markings without frank interstitial edema.  Stable cardiomegaly.  Mild degenerative changes of the visualized thoracolumbar spine.  IMPRESSION: Small left and trace right pleural effusions, unchanged.  Associated patchy left lobe opacity, likely atelectasis.   Original Report Authenticated  By: Charline Bills, M.D.       ASSESSMENT: NSTEMI: low CK, MB troponin.    PLAN:  Agree with medical therapy for MI given age and renal insufficiency.  Continue heparin IV for 48 hours.  OK to stop heparin at midnight tonight.  If there is recurrrent pain, would add plavix.  Echo showed mildly decreased LV function.  THere is a component of heart failure based on the chest xray and the LE edema.  Will giveIV Lasix as well today.  Start IMdur to see if this will help prevent any further chest pain.  Discussed cath with the patient and his son by telephone as an option only if there are refractory  symptoms.  They are in agreement and is appreciative of the care he has received.  Atrial flutter: rate controlled.  Would not consider coumadin at this time given the patient's age.  Discussed with the son.  Will see how he does with ambulation.  If sx are controlled, could look at d/c on Tuesday.  Otherwise, will plan to increase imdur. Corky Crafts., MD  03/16/2012  8:16 AM

## 2012-03-16 NOTE — Progress Notes (Signed)
ANTICOAGULATION CONSULT NOTE - Follow Up Consult  Pharmacy Consult for Heparin Indication: NSTEMI  Allergies  Allergen Reactions  . Accupril (Quinapril Hcl) Other (See Comments)    unknown  . Naproxen Rash    Patient Measurements: Height: 5\' 10"  (177.8 cm) Weight: 173 lb 1 oz (78.5 kg) IBW/kg (Calculated) : 73  Heparin Dosing Weight: 82 kg  Vital Signs: Temp: 98.1 F (36.7 C) (10/20 0531) Temp src: Oral (10/20 0531) BP: 116/47 mmHg (10/20 0531) Pulse Rate: 73  (10/20 1156)  Labs:  Basename 03/16/12 1059 03/16/12 0640 03/15/12 2137 03/15/12 0855 03/15/12 0259 03/14/12 2157 03/14/12 1840  HGB -- 11.3* 11.4* -- -- -- --  HCT -- 34.2* 33.8* 35.3* -- -- --  PLT -- 200 196 186 -- -- --  APTT -- -- -- -- -- -- --  LABPROT -- -- -- -- -- -- --  INR -- -- -- -- -- -- --  HEPARINUNFRC 0.22* -- 0.37 <0.10* -- -- --  CREATININE -- 1.57* -- -- -- -- 1.55*  CKTOTAL -- -- -- -- -- -- --  CKMB -- -- -- -- -- -- --  TROPONINI -- -- -- 1.53* 1.61* 1.68* --    Estimated Creatinine Clearance: 28.4 ml/min (by C-G formula based on Cr of 1.57).   Medications:  Infusions:     . heparin 1,250 Units/hr (03/15/12 2346)    Assessment: 78 yo male on heparin for an NSTEMI for 48 hours. Heparin level is low this morning on 1250 units/hr. Spoke with RN and no problems with heparin infusing. No bleeding noted, CBC stable.  Goal of Therapy:  Heparin level 0.3-0.7 units/ml Monitor platelets by anticoagulation protocol: Yes   Plan:  -Increase heparin drip to 1400 units/hr - to stop at midnight -No further heparin level needed with therapy ending at midnight   [facility], [place].D., BCPS Clinical Pharmacist Pager: [phone] 03/16/2012 12:06 PM

## 2012-03-17 LAB — CBC
MCH: 28.8 pg (ref 26.0–34.0)
MCHC: 32.3 g/dL (ref 30.0–36.0)
MCV: 88.9 fL (ref 78.0–100.0)
Platelets: 176 10*3/uL (ref 150–400)
RDW: 15.4 % (ref 11.5–15.5)

## 2012-03-17 LAB — BASIC METABOLIC PANEL
BUN: 41 mg/dL — ABNORMAL HIGH (ref 6–23)
Calcium: 9.1 mg/dL (ref 8.4–10.5)
Creatinine, Ser: 1.64 mg/dL — ABNORMAL HIGH (ref 0.50–1.35)
GFR calc Af Amer: 39 mL/min — ABNORMAL LOW (ref >90)

## 2012-03-17 LAB — CK TOTAL AND CKMB (NOT AT ARMC): Total CK: 29 U/L (ref 7–232)

## 2012-03-17 LAB — GLUCOSE, CAPILLARY
Glucose-Capillary: 87 mg/dL (ref 70–99)
Glucose-Capillary: 135 mg/dL — ABNORMAL HIGH (ref 70–99)

## 2012-03-17 MED ORDER — ATORVASTATIN CALCIUM 10 MG PO TABS: 10.0000 mg | ORAL_TABLET | Freq: Every day | ORAL | Status: DC

## 2012-03-17 MED ORDER — FUROSEMIDE 40 MG PO TABS: 40.0000 mg | ORAL_TABLET | Freq: Every day | ORAL | Status: DC

## 2012-03-17 MED ORDER — NITROGLYCERIN 0.4 MG SL SUBL: 0.4000 mg | SUBLINGUAL_TABLET | SUBLINGUAL | Status: AC | PRN

## 2012-03-17 MED ORDER — ISOSORBIDE MONONITRATE ER 30 MG PO TB24: 30.0000 mg | ORAL_TABLET | Freq: Every day | ORAL | Status: AC

## 2012-03-17 NOTE — Clinical Social Work Psychosocial (Signed)
Clinical Social Work Department BRIEF PSYCHOSOCIAL ASSESSMENT 03/17/2012  Patient:  [name], [name]     Account Number:  [id number]     Admit date:  03/14/2012  Clinical Social Worker:  Thomasene Mohair  Date/Time:  03/17/2012 10:00 AM  Referred by:  RN  Date Referred:  03/17/2012 Referred for  ALF Placement   Other Referral:   From [facility]   Interview type:  Patient Other interview type:   Pastor at bedside,  spoke to family on the family    PSYCHOSOCIAL DATA Living Status:  FACILITY Admitted from facility:   Level of care:  Assisted Living Primary support name:  Rogan Wigley Primary support relationship to patient:  CHILD, ADULT Degree of support available:   strong support    CURRENT CONCERNS Current Concerns  Post-Acute Placement   Other Concerns:    SOCIAL WORK ASSESSMENT / PLAN CSW was referred to Pt to assist with dc planning. Pt came to hospital from the [facility]F, non-memory care unit. Pt has lived there since it opened a few months ago and states that he enjoys the community. Pt does not use any DME at the center and walks freely even though he has been advised that he has an unsteady gait. Pt is very polite and has enjoyed the company of several visitors today without seeming to get worn out.  Pt is cleared for dc back to ALF. CSW completed paperwork and contacted ALF regarding his return.  Pt's family request that non-emergency ambulance be arranged for at dc.   Assessment/plan status:  No Further Intervention Required Other assessment/ plan:   Information/referral to community resources:    PATIENT'S/FAMILY'S RESPONSE TO PLAN OF CARE: Pt is very friendly and polite. He is engaged in the dc planning process and feels comfortable returning to ALF at dc.   Frederico Hamman, LCSW [phone]

## 2012-03-17 NOTE — Progress Notes (Signed)
CARDIAC REHAB PHASE I   PRE:  Rate/Rhythm: 61Afib/flutter  BP:  Supine:   Sitting: 102/60  Standing:    SaO2: 95%RA  MODE:  Ambulation: 550 ft   POST:  Rate/Rhythem: 118  BP:  Supine:   Sitting: 110/70  Standing:    SaO2: 96%RA 0950-1012 Pt does not like to use rolling walker as he states he did not use one before admission. Used gait belt and walker to walk 550 ft with fairly steady gait. Had generalized weakness but tolerated well without CP. Back to recliner with call bell. Visitor in room.  [name]

## 2012-03-17 NOTE — Discharge Summary (Signed)
Patient ID: CRAVEN SHOEMAKE MRN: [id number] DOB/AGE: [birth year] 76 y.o.  Admit date: 03/14/2012 Discharge date: 03/17/2012  Primary Discharge Diagnosis: Non-STEMI Secondary Discharge Diagnosis atrial flutter, diabetes, acute systolic heart failure  Significant Diagnostic Studies: cardiac graphics: Echocardiogram: Basal inferior hypokinesis, LVEF 45%  Consults: None  Hospital Course: 78 year old man who was admitted with typical cardiac pain.  He was found to have an elevated troponin and was diagnosed as a non-ST elevation MI.  There were some ST depressions noted on ECG.  He was treated with anticoagulation for 48 hours.  He was started on long-acting nitrates.  He walked with cardiac rehabilitation.  There was one brief episode of mild chest discomfort after walking on the first full day of his admission.  After that, he walked regularly without any difficulty.  He also was fluid overloaded.  He received several doses of IV Lasix and had good diuresis.  During hospitalization, his left lower extremity edema resolved.  His right lower extremity is scarred from prior melanoma surgery.  His renal function was mildly decreased but remained stable in the hospital.  Given his age and comorbidities, we elected to try to treat him medically.  He agreed to this as did his son.  Only if he had refractory symptoms would we consider cardiac catheterization.  Since he did not have any significant symptoms that return, the decision was made to continue medical therapy.  I did debate whether or not to start Plavix on this patient.  Given that he was symptom-free and he is at higher risk of bleeding, we will discontinue aspirin.  He'll be going back to his assisted living facility.  Prescriptions were given to him along with instructions on how to use sublingual nitroglycerin.  He'll also take Lasix on a daily basis.  I have informed Dr. Azucena Cecil of his admission as well.  Prior cholesterol testing from the office  had shown an LDL just below 100 and occasionally above 100.  He was started on low-dose statin to help try and get his LDL closer to 70.  He will remain on his current diabetic medication regimen.   Discharge Exam: Blood pressure 101/54, pulse 66, temperature 98.2 F (36.8 C), temperature source Oral, resp. rate 18, height 5\' 10"  (1.778 m), weight 79.6 kg (175 lb 7.8 oz), SpO2 98.00%.   [place]/AT Irregularly irregular, S1 S2 Decreased breath sounds at the bases No left leg edema; right leg scarred from prior surgery Labs:   Lab Results  Component Value Date   WBC 12.8* 03/17/2012   HGB 10.9* 03/17/2012   HCT 33.7* 03/17/2012   MCV 88.9 03/17/2012   PLT 176 03/17/2012    Lab 03/17/12 0540  NA 138  K 4.0  CL 100  CO2 31  BUN 41*  CREATININE 1.64*  CALCIUM 9.1  PROT --  BILITOT --  ALKPHOS --  ALT --  AST --  GLUCOSE 91   Lab Results  Component Value Date   CKTOTAL 29 03/17/2012   CKMB 3.7 03/17/2012   TROPONINI 1.53* 03/15/2012    No results found for this basename: CHOL   No results found for this basename: HDL   No results found for this basename: LDLCALC   No results found for this basename: TRIG   No results found for this basename: CHOLHDL   No results found for this basename: LDLDIRECT      Radiology: CXR with some vascular congestion and pleural effusion NWG:NFAOZH flutter, rate controlled; inferolateral T wave inversions  FOLLOW UP PLANS AND APPOINTMENTS    Medication List     As of 03/17/2012 10:47 AM    TAKE these medications         acetaminophen 500 MG tablet   Commonly known as: TYLENOL   Take 500 mg by mouth every 4 (four) hours as needed. Fever/pain      aspirin 81 MG chewable tablet   Chew 81 mg by mouth daily.      atorvastatin 10 MG tablet   Commonly known as: LIPITOR   Take 1 tablet (10 mg total) by mouth daily at 6 PM.      beta carotene w/minerals tablet   Take 1 tablet by mouth daily.      diltiazem 180 MG 24 hr capsule     Commonly known as: CARDIZEM CD   Take 1 capsule (180 mg total) by mouth daily.      feeding supplement Liqd   Take 237 mLs by mouth daily at 12 noon.      furosemide 40 MG tablet   Commonly known as: LASIX   Take 1 tablet (40 mg total) by mouth daily.   Start taking on: 03/18/2012      hydrOXYzine 25 MG tablet   Commonly known as: ATARAX/VISTARIL   Take 25 mg by mouth at bedtime as needed. For insomnia      insulin glargine 100 UNIT/ML injection   Commonly known as: LANTUS   Inject 20 Units into the skin at bedtime.      insulin lispro 100 UNIT/ML injection   Commonly known as: HUMALOG   Inject 3 Units into the skin 3 (three) times daily before meals.      IOPHEN-NR 100 MG/5ML liquid   Generic drug: guaiFENesin   Take 200 mg by mouth every 6 (six) hours as needed. For cough      isosorbide mononitrate 30 MG 24 hr tablet   Commonly known as: IMDUR   Take 1 tablet (30 mg total) by mouth daily.      levothyroxine 112 MCG tablet   Commonly known as: SYNTHROID, LEVOTHROID   Take 112 mcg by mouth daily.      LORazepam 0.5 MG tablet   Commonly known as: ATIVAN   Take 0.5 mg by mouth at bedtime as needed. Sleeping      metoprolol 50 MG tablet   Commonly known as: LOPRESSOR   Take 1 tablet (50 mg total) by mouth 2 (two) times daily.      nitroGLYCERIN 0.4 MG SL tablet   Commonly known as: NITROSTAT   Place 1 tablet (0.4 mg total) under the tongue every 5 (five) minutes x 3 doses as needed for chest pain.      Tamsulosin HCl 0.4 MG Caps   Commonly known as: FLOMAX   Take 0.4 mg by mouth daily.      Vitamin D 2000 UNITS Caps   Take 1 capsule by mouth daily.           Follow-up Information    Follow up with Corky Crafts., MD. On 03/25/2012. (10:30AM)    Contact information:   [address] [place] [address] [phone]          BRING ALL MEDICATIONS WITH YOU TO FOLLOW UP APPOINTMENTS  Time spent with patient to include physician  time: 35 minutes including going over medication changes and speaking to family regarding discharge planning. SignedCorky Crafts. 03/17/2012, 10:47 AM

## 2012-04-03 ENCOUNTER — Other Ambulatory Visit: Payer: Self-pay

## 2012-09-09 ENCOUNTER — Other Ambulatory Visit: Payer: Self-pay | Admitting: Dermatology

## 2012-09-19 ENCOUNTER — Encounter (HOSPITAL_COMMUNITY): Payer: Self-pay | Admitting: Nurse Practitioner

## 2012-09-19 ENCOUNTER — Inpatient Hospital Stay (HOSPITAL_COMMUNITY)
Admission: EM | Admit: 2012-09-19 | Discharge: 2012-09-20 | DRG: 194 | Disposition: A | Discharge status: Nursing Home (Includes ICF) | Payer: Medicare Other | Attending: Internal Medicine | Admitting: Internal Medicine

## 2012-09-19 ENCOUNTER — Emergency Department (HOSPITAL_COMMUNITY): Payer: Medicare Other

## 2012-09-19 DIAGNOSIS — C801 Malignant (primary) neoplasm, unspecified: Secondary | ICD-10-CM | POA: Diagnosis present

## 2012-09-19 DIAGNOSIS — D72829 Elevated white blood cell count, unspecified: Secondary | ICD-10-CM | POA: Diagnosis present

## 2012-09-19 DIAGNOSIS — R410 Disorientation, unspecified: Secondary | ICD-10-CM

## 2012-09-19 DIAGNOSIS — R042 Hemoptysis: Secondary | ICD-10-CM

## 2012-09-19 DIAGNOSIS — H9190 Unspecified hearing loss, unspecified ear: Secondary | ICD-10-CM | POA: Diagnosis present

## 2012-09-19 DIAGNOSIS — N400 Enlarged prostate without lower urinary tract symptoms: Secondary | ICD-10-CM

## 2012-09-19 DIAGNOSIS — J189 Pneumonia, unspecified organism: Principal | ICD-10-CM | POA: Diagnosis present

## 2012-09-19 DIAGNOSIS — Z794 Long term (current) use of insulin: Secondary | ICD-10-CM

## 2012-09-19 DIAGNOSIS — N189 Chronic kidney disease, unspecified: Secondary | ICD-10-CM | POA: Diagnosis present

## 2012-09-19 DIAGNOSIS — R52 Pain, unspecified: Secondary | ICD-10-CM

## 2012-09-19 DIAGNOSIS — Z79899 Other long term (current) drug therapy: Secondary | ICD-10-CM

## 2012-09-19 DIAGNOSIS — I214 Non-ST elevation (NSTEMI) myocardial infarction: Secondary | ICD-10-CM

## 2012-09-19 DIAGNOSIS — R4182 Altered mental status, unspecified: Secondary | ICD-10-CM

## 2012-09-19 DIAGNOSIS — E039 Hypothyroidism, unspecified: Secondary | ICD-10-CM | POA: Diagnosis present

## 2012-09-19 DIAGNOSIS — E119 Type 2 diabetes mellitus without complications: Secondary | ICD-10-CM | POA: Diagnosis present

## 2012-09-19 DIAGNOSIS — I2510 Atherosclerotic heart disease of native coronary artery without angina pectoris: Secondary | ICD-10-CM | POA: Diagnosis present

## 2012-09-19 DIAGNOSIS — R269 Unspecified abnormalities of gait and mobility: Secondary | ICD-10-CM

## 2012-09-19 DIAGNOSIS — R451 Restlessness and agitation: Secondary | ICD-10-CM

## 2012-09-19 DIAGNOSIS — Z7982 Long term (current) use of aspirin: Secondary | ICD-10-CM

## 2012-09-19 DIAGNOSIS — I129 Hypertensive chronic kidney disease with stage 1 through stage 4 chronic kidney disease, or unspecified chronic kidney disease: Secondary | ICD-10-CM | POA: Diagnosis present

## 2012-09-19 DIAGNOSIS — I4892 Unspecified atrial flutter: Secondary | ICD-10-CM | POA: Diagnosis present

## 2012-09-19 DIAGNOSIS — I10 Essential (primary) hypertension: Secondary | ICD-10-CM | POA: Diagnosis present

## 2012-09-19 DIAGNOSIS — R531 Weakness: Secondary | ICD-10-CM

## 2012-09-19 DIAGNOSIS — C61 Malignant neoplasm of prostate: Secondary | ICD-10-CM | POA: Diagnosis present

## 2012-09-19 DIAGNOSIS — N179 Acute kidney failure, unspecified: Secondary | ICD-10-CM | POA: Diagnosis present

## 2012-09-19 DIAGNOSIS — R040 Epistaxis: Secondary | ICD-10-CM

## 2012-09-19 LAB — CBC WITH DIFFERENTIAL/PLATELET
Basophils Absolute: 0 10*3/uL (ref 0.0–0.1)
HCT: 33.1 % — ABNORMAL LOW (ref 39.0–52.0)
Hemoglobin: 11.1 g/dL — ABNORMAL LOW (ref 13.0–17.0)
Lymphocytes Relative: 23 % (ref 12–46)
Monocytes Absolute: 1.4 10*3/uL — ABNORMAL HIGH (ref 0.1–1.0)
Monocytes Relative: 11 % (ref 3–12)
Neutro Abs: 7.5 10*3/uL (ref 1.7–7.7)
WBC: 12.4 10*3/uL — ABNORMAL HIGH (ref 4.0–10.5)

## 2012-09-19 LAB — HEMOGLOBIN A1C
Hgb A1c MFr Bld: 7.4 % — ABNORMAL HIGH
Mean Plasma Glucose: 166 mg/dL — ABNORMAL HIGH

## 2012-09-19 LAB — BASIC METABOLIC PANEL
Chloride: 98 mEq/L (ref 96–112)
Creatinine, Ser: 1.78 mg/dL — ABNORMAL HIGH (ref 0.50–1.35)
GFR calc Af Amer: 35 mL/min — ABNORMAL LOW (ref >90)
GFR calc non Af Amer: 30 mL/min — ABNORMAL LOW (ref >90)
Potassium: 4.1 mEq/L (ref 3.5–5.1)

## 2012-09-19 LAB — GLUCOSE, CAPILLARY: Glucose-Capillary: 206 mg/dL — ABNORMAL HIGH (ref 70–99)

## 2012-09-19 LAB — PROTIME-INR: INR: 0.76 (ref 0.00–1.49)

## 2012-09-19 MED ORDER — INSULIN ASPART 100 UNIT/ML ~~LOC~~ SOLN
0.0000 [IU] | Freq: Three times a day (TID) | SUBCUTANEOUS | Status: DC
  Administered 2012-09-19: 3 [IU] via SUBCUTANEOUS
  Administered 2012-09-20: 2 [IU] via SUBCUTANEOUS
  Administered 2012-09-20: 1 [IU] via SUBCUTANEOUS

## 2012-09-19 MED ORDER — INSULIN ASPART 100 UNIT/ML ~~LOC~~ SOLN
3.0000 [IU] | Freq: Three times a day (TID) | SUBCUTANEOUS | Status: DC
  Administered 2012-09-19 – 2012-09-20 (×3): 3 [IU] via SUBCUTANEOUS

## 2012-09-19 MED ORDER — METOPROLOL TARTRATE 50 MG PO TABS
50.0000 mg | ORAL_TABLET | Freq: Two times a day (BID) | ORAL | Status: DC
  Administered 2012-09-20 (×2): 50 mg via ORAL
  Filled 2012-09-19 (×3): qty 1

## 2012-09-19 MED ORDER — INSULIN GLARGINE 100 UNIT/ML ~~LOC~~ SOLN
15.0000 [IU] | Freq: Every day | SUBCUTANEOUS | Status: DC
  Administered 2012-09-20: 15 [IU] via SUBCUTANEOUS
  Filled 2012-09-19 (×3): qty 0.15

## 2012-09-19 MED ORDER — VANCOMYCIN HCL IN DEXTROSE 1-5 GM/200ML-% IV SOLN
1000.0000 mg | Freq: Once | INTRAVENOUS | Status: AC
  Administered 2012-09-19: 1000 mg via INTRAVENOUS
  Filled 2012-09-19: qty 200

## 2012-09-19 MED ORDER — SODIUM CHLORIDE 0.9 % IV BOLUS (SEPSIS)
250.0000 mL | Freq: Once | INTRAVENOUS | Status: AC
  Administered 2012-09-19: 250 mL via INTRAVENOUS

## 2012-09-19 MED ORDER — TAMSULOSIN HCL 0.4 MG PO CAPS
0.4000 mg | ORAL_CAPSULE | Freq: Every day | ORAL | Status: DC
  Administered 2012-09-19 – 2012-09-20 (×2): 0.4 mg via ORAL
  Filled 2012-09-19 (×2): qty 1

## 2012-09-19 MED ORDER — VANCOMYCIN HCL IN DEXTROSE 1-5 GM/200ML-% IV SOLN
1000.0000 mg | INTRAVENOUS | Status: DC
  Filled 2012-09-19: qty 200

## 2012-09-19 MED ORDER — VITAMIN D 50 MCG (2000 UT) PO CAPS: 1.0000 | ORAL_CAPSULE | Freq: Every day | ORAL | Status: DC

## 2012-09-19 MED ORDER — VITAMIN D3 25 MCG (1000 UNIT) PO TABS
2000.0000 [IU] | ORAL_TABLET | Freq: Every day | ORAL | Status: DC
  Administered 2012-09-19 – 2012-09-20 (×2): 2000 [IU] via ORAL
  Filled 2012-09-19 (×2): qty 2

## 2012-09-19 MED ORDER — GUAIFENESIN 100 MG/5ML PO LIQD: 200.0000 mg | Freq: Four times a day (QID) | ORAL | Status: DC | PRN

## 2012-09-19 MED ORDER — DEXTROSE 5 % IV SOLN
1.0000 g | Freq: Three times a day (TID) | INTRAVENOUS | Status: DC
  Filled 2012-09-19 (×2): qty 1

## 2012-09-19 MED ORDER — DEXTROSE 5 % IV SOLN
1.0000 g | INTRAVENOUS | Status: AC
  Administered 2012-09-19: 1 g via INTRAVENOUS
  Filled 2012-09-19: qty 1

## 2012-09-19 MED ORDER — ISOSORBIDE MONONITRATE ER 30 MG PO TB24
30.0000 mg | ORAL_TABLET | Freq: Every day | ORAL | Status: DC
Start: 2012-09-20 — End: 2012-09-20
  Administered 2012-09-20: 30 mg via ORAL
  Filled 2012-09-19: qty 1

## 2012-09-19 MED ORDER — LEVOFLOXACIN IN D5W 750 MG/150ML IV SOLN: 750.0000 mg | Freq: Once | INTRAVENOUS | Status: DC

## 2012-09-19 MED ORDER — ACETAMINOPHEN 500 MG PO TABS: 500.0000 mg | ORAL_TABLET | Freq: Four times a day (QID) | ORAL | Status: DC | PRN

## 2012-09-19 MED ORDER — ENSURE COMPLETE PO LIQD: 237.0000 mL | Freq: Every day | ORAL | Status: DC

## 2012-09-19 MED ORDER — OCUVITE PO TABS
1.0000 | ORAL_TABLET | Freq: Every day | ORAL | Status: DC
  Administered 2012-09-20: 1 via ORAL
  Filled 2012-09-19: qty 1

## 2012-09-19 MED ORDER — GUAIFENESIN 100 MG/5ML PO SOLN: 10.0000 mL | Freq: Four times a day (QID) | ORAL | Status: DC | PRN

## 2012-09-19 MED ORDER — ATORVASTATIN CALCIUM 10 MG PO TABS
10.0000 mg | ORAL_TABLET | Freq: Every day | ORAL | Status: DC
  Administered 2012-09-19: 10 mg via ORAL
  Filled 2012-09-19 (×2): qty 1

## 2012-09-19 MED ORDER — DEXTROSE 5 % IV SOLN: 1.0000 g | Freq: Three times a day (TID) | INTRAVENOUS | Status: DC

## 2012-09-19 MED ORDER — DILTIAZEM HCL ER COATED BEADS 180 MG PO CP24
180.0000 mg | ORAL_CAPSULE | Freq: Every day | ORAL | Status: DC
  Filled 2012-09-19: qty 1

## 2012-09-19 MED ORDER — LEVOFLOXACIN IN D5W 750 MG/150ML IV SOLN: 750.0000 mg | INTRAVENOUS | Status: DC

## 2012-09-19 MED ORDER — LEVOTHYROXINE SODIUM 112 MCG PO TABS
112.0000 ug | ORAL_TABLET | Freq: Every day | ORAL | Status: DC
  Administered 2012-09-20: 112 ug via ORAL
  Filled 2012-09-19 (×2): qty 1

## 2012-09-19 MED ORDER — DEXTROSE 5 % IV SOLN
1.0000 g | INTRAVENOUS | Status: DC
  Filled 2012-09-19: qty 1

## 2012-09-19 NOTE — H&P (Addendum)
[facility] History and Physical  ENRRIQUE MIERZWA Lang:[id number] DOB: 1914/01/19 DOA: 09/19/2012  Referring physician: Dr. Clarene Duke PCP: Sissy Hoff, MD  Specialists: none  Chief Complaint: cough, epistaxis, hemoptysis  HPI: [name] is a 79 y.o. male has a past medical history significant for CAD s/p NSTEMI in October of 2013 (was hospitalized here and his cardiologist was Dr.Varanasi) - patient improved with medical management, and did not undergo cardiac catheterization and he is to resume medical management upon discharge. Plavix was not started at that time out of concern for bleeding. She was fluid overloaded that time responded well to Lasix. He also has a history of diabetes, insulin-dependent, last hemoglobin A1c was 8.2 in 2013, also has a history of atrial fibrillation, rate controlled. He also has a history of hypothyroidism. He comes in today with a chief complaint of a cough that's been going on for the past week. His cough is productive in nature. He does report that in the last couple days, he has been having problems with epistaxis (on the right side). He endorses one episode yesterday was so severe that she went up with his shirt drenched in blood. There is also reported that he has been coughing up blood, but patient doubts that he is coming from his cough and maybe just in the back of his throat from his nosebleeds. He denies any chest pain, denies any breathing difficulties, he denies any abdominal pain, nausea, vomiting or diarrhea. He denies any fevers or chills. Reports he has had pneumonias in the past couple of times. He denies any lightheadedness and dizziness.  Review of Systems: As per history of present illness, otherwise negative   Past Medical History  Diagnosis Date  . Prostate cancer   . Hypothyroidism   . Hyperglycemia   . DMII (diabetes mellitus, type 2)   . Prostate cancer, primary, with metastasis from prostate to other site 12/06/2011  .  Prostate cancer metastatic to multiple sites   . A-fib   . CKD (chronic kidney disease)    Past Surgical History  Procedure Laterality Date  . Melanoma removed from left leg  1971    x 14  . Left cataract extraction    . Prostate surgery    . Sinus exploration     Social History:  reports that he has never smoked. He has never used smokeless tobacco. He reports that he does not drink alcohol or use illicit drugs.  Allergies  Allergen Reactions  . Accupril (Quinapril Hcl) Other (See Comments)    unknown  . Naproxen Rash    Family History  Problem Relation Age of Onset  . CAD Father 27    Prior to Admission medications   Medication Sig Start Date End Date Taking? Authorizing Provider  acetaminophen (TYLENOL) 500 MG tablet Take 500 mg by mouth every 4 (four) hours as needed. Fever/pain   Yes Historical Provider, MD  aspirin 81 MG chewable tablet Chew 81 mg by mouth daily.   Yes Historical Provider, MD  atorvastatin (LIPITOR) 10 MG tablet Take 1 tablet (10 mg total) by mouth daily at 6 PM. 03/17/12  Yes Corky Crafts, MD  beta carotene w/minerals (OCUVITE) tablet Take 1 tablet by mouth daily.   Yes Historical Provider, MD  Cholecalciferol (VITAMIN D) 2000 UNITS CAPS Take 1 capsule by mouth daily.   Yes Historical Provider, MD  diltiazem (CARDIZEM CD) 180 MG 24 hr capsule Take 1 capsule (180 mg total) by mouth daily. 10/12/11 10/11/12  Yes Gwenyth Bender, NP  feeding supplement (ENSURE COMPLETE) LIQD Take 237 mLs by mouth daily at 12 noon. 12/10/11  Yes Penny Pia, MD  furosemide (LASIX) 40 MG tablet Take 1 tablet (40 mg total) by mouth daily. 03/18/12  Yes Corky Crafts, MD  guaiFENesin [facility]) 100 MG/5ML liquid Take 200 mg by mouth every 6 (six) hours as needed. For cough   Yes Historical Provider, MD  hydrOXYzine (ATARAX/VISTARIL) 25 MG tablet Take 25 mg by mouth at bedtime as needed. For insomnia   Yes Historical Provider, MD  insulin glargine (LANTUS) 100 UNIT/ML  injection Inject 20 Units into the skin at bedtime.   Yes Historical Provider, MD  insulin lispro (HUMALOG) 100 UNIT/ML injection Inject 5 Units into the skin 3 (three) times daily before meals.   Yes Historical Provider, MD  isosorbide mononitrate (IMDUR) 30 MG 24 hr tablet Take 1 tablet (30 mg total) by mouth daily. 03/17/12  Yes Corky Crafts, MD  levothyroxine (SYNTHROID, LEVOTHROID) 112 MCG tablet Take 112 mcg by mouth daily.   Yes Historical Provider, MD  LORazepam (ATIVAN) 0.5 MG tablet Take 0.5 mg by mouth at bedtime as needed. Sleeping   Yes Historical Provider, MD  metoprolol (LOPRESSOR) 50 MG tablet Take 1 tablet (50 mg total) by mouth 2 (two) times daily. 10/12/11 10/11/12 Yes Lesle Chris Black, NP  nitroGLYCERIN (NITROSTAT) 0.4 MG SL tablet Place 1 tablet (0.4 mg total) under the tongue every 5 (five) minutes x 3 doses as needed for chest pain. 03/17/12  Yes Corky Crafts, MD  Tamsulosin HCl (FLOMAX) 0.4 MG CAPS Take 0.4 mg by mouth daily.   Yes Historical Provider, MD   Physical Exam: Filed Vitals:   09/19/12 1022 09/19/12 1023 09/19/12 1024 09/19/12 1124  BP: 127/66 141/77 136/74 128/84  Pulse: 72 84 79 81  Temp:      TempSrc:      Resp:    16  SpO2:    97%    General:  NAD, pleasant elderly male very hard of hearing  Eyes: PERRL, EOMI  ENT: moist oropharynx; evidence of fresh blood right nostril suggesting very recent bleed  Neck: supple, no JVD  Cardiovascular: irregular, without MRG   Respiratory: good air movement, scant wheezes  Abdomen: soft, non tender to palpation, positive bowel sounds, no guarding, no rebound  Skin: no rashes  Musculoskeletal: no peripheral edema, right LE with changes similar to chronic vv stasis  Psychiatric: normal mood and affect  Neurologic: CN 2-12 grossly intact, MS 5/5 in all 4  Labs on Admission:  Basic Metabolic Panel:  Recent Labs Lab 09/19/12 0900  NA 136  K 4.1  CL 98  CO2 31  GLUCOSE 61*  BUN 33*   CREATININE 1.78*  CALCIUM 9.5   CBC:  Recent Labs Lab 09/19/12 0900 09/19/12 1000  WBC SPECIMEN CLOTTED 12.4*  NEUTROABS SPECIMEN CLOTTED 7.5  HGB SPECIMEN CLOTTED 11.1*  HCT SPECIMEN CLOTTED 33.1*  MCV SPECIMEN CLOTTED 90.7  PLT SPECIMEN CLOTTED 182   BNP (last 3 results)  Recent Labs  10/08/11 1200 03/14/12 2157  PROBNP 8566.0* 13433.0*   Radiological Exams on Admission: Dg Chest 2 View  09/19/2012  *RADIOLOGY REPORT*  Clinical Data: Hemoptysis, cough  CHEST - 2 VIEW  Comparison: March 14, 2012.  Findings: Stable mild cardiomegaly.  Minimal left pleural effusion is noted and smaller compared to prior exam.  Faint left upper lobe opacity is noted suggesting possible pneumonia. Right lung is clear.  No pneumothorax is noted.  Bony thorax intact.  IMPRESSION: Minimal left pleural effusion.  Ill-defined mild left upper lobe opacity is noted concerning for pneumonia or subsegmental atelectasis; follow-up radiographs are recommended to ensure resolution.   Original Report Authenticated By: Lupita Raider.,  M.D.    EKG: Independently reviewed. A fib  Assessment/Plan Active Problems:   Atrial flutter   HTN (hypertension)   DM (diabetes mellitus)   Acute renal failure   HCAP (healthcare-associated pneumonia)  Healthcare associated pneumonia  - sputum cultures - Vancomycin, Cefepime - breathing comfortable on room air now  Epistaxis - no active epistaxis, suspect that this is the origin of hemoptysis - will monitor  A fib - rate controlled, continue home medications  HTN - continue home medications  DM - home Lantus 20 U, will start 15 U lantus plus 3 U short acting with meals and SSI - check HBA1C  Hypothyroidism - last TSH normal in Oct 2013 - continue Synthroid  Acute on chronic renal failure - baseline Cr 1.4-1.6, now not far off - gentle 250 bolus and recheck in the morning, if Cr better can restart home Lasix.   DVT Prophylaxis - SCDs, hold  heparin  Code Status: DNR (durable form)  Family Communication: none  Disposition Plan: inpatient  Time spent: 1  Monterrio Gerst M. Elvera Lennox, MD [facility] Pager [phone]  If 7PM-7AM, please contact night-coverage [email or web address] Password [email or web address] 09/19/2012, 1:23 PM

## 2012-09-19 NOTE — ED Provider Notes (Signed)
History     CSN: [id number]  Arrival date & time 09/19/12  3086   First MD Initiated Contact with Patient 09/19/12 0827      Chief Complaint  Patient presents with  . Epistaxis  . Cough  . Hemoptysis     HPI Pt was seen at 0835.   Per pt and his family, c/o gradual onset and persistence of constant "cough" for the past several weeks.  Pt describes the cough as "wet."  Pt also c/o intermittent nose bleeds and "waking up with blood clots in the back of my throat" for the past 2 days.  Pt's family states he has been "coughing blood out."  Family concerned regarding pneumonia as cause for symptoms, as pt has hx of same.  Denies hematemesis. Denies N/V/D, no abd pain, no back pain, no fevers, no CP/SOB, no black or blood in stools.     Past Medical History  Diagnosis Date  . Prostate cancer   . Hypothyroidism   . Hyperglycemia   . DMII (diabetes mellitus, type 2)   . Prostate cancer, primary, with metastasis from prostate to other site 12/06/2011  . Prostate cancer metastatic to multiple sites   . A-fib   . CKD (chronic kidney disease)     Past Surgical History  Procedure Laterality Date  . Melanoma removed from left leg  1971    x 14  . Left cataract extraction    . Prostate surgery    . Sinus exploration      Family History  Problem Relation Age of Onset  . CAD Father 8    History  Substance Use Topics  . Smoking status: Never Smoker   . Smokeless tobacco: Never Used  . Alcohol Use: No      Review of Systems ROS: Statement: All systems negative except as marked or noted in the HPI; Constitutional: Negative for fever and chills. ; ; Eyes: Negative for eye pain, redness and discharge. ; ; ENMT: +nosebleed. Negative for ear pain, hoarseness, nasal congestion, sinus pressure and sore throat. ; ; Cardiovascular: Negative for chest pain, palpitations, diaphoresis, dyspnea and peripheral edema. ; ; Respiratory: +cough. Negative for wheezing and stridor. ; ;  Gastrointestinal: Negative for nausea, vomiting, diarrhea, abdominal pain, blood in stool, hematemesis, jaundice and rectal bleeding. . ; ; Genitourinary: Negative for dysuria, flank pain and hematuria. ; ; Musculoskeletal: Negative for back pain and neck pain. Negative for swelling and trauma.; ; Skin: Negative for pruritus, rash, abrasions, blisters, bruising and skin lesion.; ; Neuro: Negative for headache, lightheadedness and neck stiffness. Negative for weakness, altered level of consciousness , altered mental status, extremity weakness, paresthesias, involuntary movement, seizure and syncope.       Allergies  Accupril and Naproxen  Home Medications   Current Outpatient Rx  Name  Route  Sig  Dispense  Refill  . acetaminophen (TYLENOL) 500 MG tablet   Oral   Take 500 mg by mouth every 4 (four) hours as needed. Fever/pain         . aspirin 81 MG chewable tablet   Oral   Chew 81 mg by mouth daily.         Marland Kitchen atorvastatin (LIPITOR) 10 MG tablet   Oral   Take 1 tablet (10 mg total) by mouth daily at 6 PM.   30 tablet   11   . beta carotene w/minerals (OCUVITE) tablet   Oral   Take 1 tablet by mouth daily.         Marland Kitchen  Cholecalciferol (VITAMIN D) 2000 UNITS CAPS   Oral   Take 1 capsule by mouth daily.         Marland Kitchen diltiazem (CARDIZEM CD) 180 MG 24 hr capsule   Oral   Take 1 capsule (180 mg total) by mouth daily.         . feeding supplement (ENSURE COMPLETE) LIQD   Oral   Take 237 mLs by mouth daily at 12 noon.   15 Bottle   0   . furosemide (LASIX) 40 MG tablet   Oral   Take 1 tablet (40 mg total) by mouth daily.   30 tablet   11   . guaiFENesin (IOPHEN-NR) 100 MG/5ML liquid   Oral   Take 200 mg by mouth every 6 (six) hours as needed. For cough         . hydrOXYzine (ATARAX/VISTARIL) 25 MG tablet   Oral   Take 25 mg by mouth at bedtime as needed. For insomnia         . insulin glargine (LANTUS) 100 UNIT/ML injection   Subcutaneous   Inject 20 Units  into the skin at bedtime.         . insulin lispro (HUMALOG) 100 UNIT/ML injection   Subcutaneous   Inject 5 Units into the skin 3 (three) times daily before meals.         . isosorbide mononitrate (IMDUR) 30 MG 24 hr tablet   Oral   Take 1 tablet (30 mg total) by mouth daily.   30 tablet   11   . levothyroxine (SYNTHROID, LEVOTHROID) 112 MCG tablet   Oral   Take 112 mcg by mouth daily.         Marland Kitchen LORazepam (ATIVAN) 0.5 MG tablet   Oral   Take 0.5 mg by mouth at bedtime as needed. Sleeping         . metoprolol (LOPRESSOR) 50 MG tablet   Oral   Take 1 tablet (50 mg total) by mouth 2 (two) times daily.         . nitroGLYCERIN (NITROSTAT) 0.4 MG SL tablet   Sublingual   Place 1 tablet (0.4 mg total) under the tongue every 5 (five) minutes x 3 doses as needed for chest pain.   25 tablet   6   . Tamsulosin HCl (FLOMAX) 0.4 MG CAPS   Oral   Take 0.4 mg by mouth daily.           BP 136/74  Pulse 79  Temp(Src) 98.2 F (36.8 C) (Oral)  Resp 16  SpO2 95%  Physical Exam 0835: Physical examination:  Nursing notes reviewed; Vital signs and O2 SAT reviewed;  Constitutional: Well developed, Well nourished, Well hydrated, In no acute distress; Head:  Normocephalic, atraumatic; Eyes: EOMI, PERRL, No scleral icterus; ENMT: +friable nasal mucosa anterior nares bilat without active bleeding. No blood in posterior pharynx. Mouth and pharynx normal, Mucous membranes moist; Neck: Supple, Full range of motion, No lymphadenopathy; Cardiovascular: Irregular irregular rate and rhythm, No gallop; Respiratory: Breath sounds coarse L>R, No wheezes.  Speaking full sentences with ease, Normal respiratory effort/excursion; Chest: Nontender, Movement normal; Abdomen: Soft, Nontender, Nondistended, Normal bowel sounds; Genitourinary: No CVA tenderness; Extremities: Pulses normal, No tenderness, No deformity.; Neuro: AA&Ox3, Major CN grossly intact.  Speech clear. No gross focal motor or sensory  deficits in extremities.; Skin: Color normal, Warm, Dry.   ED Course  Procedures     MDM  MDM Reviewed: previous chart, nursing note and  vitals Reviewed previous: labs Interpretation: labs and x-ray   Results for orders placed during the hospital encounter of 09/19/12  BASIC METABOLIC PANEL      Result Value Range   Sodium 136  135 - 145 mEq/L   Potassium 4.1  3.5 - 5.1 mEq/L   Chloride 98  96 - 112 mEq/L   CO2 31  19 - 32 mEq/L   Glucose, Bld 61 (*) 70 - 99 mg/dL   BUN 33 (*) 6 - 23 mg/dL   Creatinine, Ser 4.09 (*) 0.50 - 1.35 mg/dL   Calcium 9.5  8.4 - 81.1 mg/dL   GFR calc non Af Amer 30 (*) >90 mL/min   GFR calc Af Amer 35 (*) >90 mL/min  CBC WITH DIFFERENTIAL      Result Value Range   WBC SPECIMEN CLOTTED  4.0 - 10.5 K/uL   RBC SPECIMEN CLOTTED  4.22 - 5.81 MIL/uL   Hemoglobin SPECIMEN CLOTTED  13.0 - 17.0 g/dL   HCT SPECIMEN CLOTTED  39.0 - 52.0 %   MCV SPECIMEN CLOTTED  78.0 - 100.0 fL   MCH SPECIMEN CLOTTED  26.0 - 34.0 pg   MCHC SPECIMEN CLOTTED  30.0 - 36.0 g/dL   RDW SPECIMEN CLOTTED  11.5 - 15.5 %   Platelets SPECIMEN CLOTTED  150 - 400 K/uL   Neutrophils Relative SPECIMEN CLOTTED  43 - 77 %   Neutro Abs SPECIMEN CLOTTED  1.7 - 7.7 K/uL   Band Neutrophils SPECIMEN CLOTTED  0 - 10 %   Lymphocytes Relative SPECIMEN CLOTTED  12 - 46 %   Lymphs Abs SPECIMEN CLOTTED  0.7 - 4.0 K/uL   Monocytes Relative SPECIMEN CLOTTED  3 - 12 %   Monocytes Absolute SPECIMEN CLOTTED  0.1 - 1.0 K/uL   Eosinophils Relative SPECIMEN CLOTTED  0 - 5 %   Eosinophils Absolute SPECIMEN CLOTTED  0.0 - 0.7 K/uL   Basophils Relative SPECIMEN CLOTTED  0 - 1 %   Basophils Absolute SPECIMEN CLOTTED  0.0 - 0.1 K/uL   LUCs, % SPECIMEN CLOTTED  0 - 4 %   LUC, Absolute SPECIMEN CLOTTED  0.0 - 0.5 K/uL   WBC Morphology SPECIMEN CLOTTED     RBC Morphology SPECIMEN CLOTTED     Smear Review SPECIMEN CLOTTED     Other SPECIMEN CLOTTED     Other 2 SPECIMEN CLOTTED     nRBC SPECIMEN CLOTTED  0  /100 WBC   Metamyelocytes Relative SPECIMEN CLOTTED     Myelocytes SPECIMEN CLOTTED     Promyelocytes Absolute SPECIMEN CLOTTED     Blasts SPECIMEN CLOTTED    APTT      Result Value Range   aPTT 20 (*) 24 - 37 seconds  PROTIME-INR      Result Value Range   Prothrombin Time 10.7 (*) 11.6 - 15.2 seconds   INR 0.76  0.00 - 1.49  CBC WITH DIFFERENTIAL      Result Value Range   WBC 12.4 (*) 4.0 - 10.5 K/uL   RBC 3.65 (*) 4.22 - 5.81 MIL/uL   Hemoglobin 11.1 (*) 13.0 - 17.0 g/dL   HCT 91.4 (*) 78.2 - 95.6 %   MCV 90.7  78.0 - 100.0 fL   MCH 30.4  26.0 - 34.0 pg   MCHC 33.5  30.0 - 36.0 g/dL   RDW 21.3  08.6 - 57.8 %   Platelets 182  150 - 400 K/uL   Neutrophils Relative 61  43 - 77 %  Neutro Abs 7.5  1.7 - 7.7 K/uL   Lymphocytes Relative 23  12 - 46 %   Lymphs Abs 2.9  0.7 - 4.0 K/uL   Monocytes Relative 11  3 - 12 %   Monocytes Absolute 1.4 (*) 0.1 - 1.0 K/uL   Eosinophils Relative 5  0 - 5 %   Eosinophils Absolute 0.6  0.0 - 0.7 K/uL   Basophils Relative 0  0 - 1 %   Basophils Absolute 0.0  0.0 - 0.1 K/uL  LACTIC ACID, PLASMA      Result Value Range   Lactic Acid, Venous 0.8  0.5 - 2.2 mmol/L   Dg Chest 2 View 09/19/2012  *RADIOLOGY REPORT*  Clinical Data: Hemoptysis, cough  CHEST - 2 VIEW  Comparison: March 14, 2012.  Findings: Stable mild cardiomegaly.  Minimal left pleural effusion is noted and smaller compared to prior exam.  Faint left upper lobe opacity is noted suggesting possible pneumonia. Right lung is clear.  No pneumothorax is noted.  Bony thorax intact.  IMPRESSION: Minimal left pleural effusion.  Ill-defined mild left upper lobe opacity is noted concerning for pneumonia or subsegmental atelectasis; follow-up radiographs are recommended to ensure resolution.   Original Report Authenticated By: Lupita Raider.,  M.D.     Results for JASKIRAT, SCHWIEGER (MRN [id number]) as of 09/19/2012 13:14  Ref. Range 03/16/2012 06:40 03/17/2012 05:40 09/19/2012 09:00  BUN Latest  Range: 6-23 mg/dL 34 (H) 41 (H) 33 (H)  Creatinine Latest Range: 0.50-1.35 mg/dL 4.09 (H) 8.11 (H) 9.14 (H)      Results for LINAS, STEPTER (MRN [id number]) as of 09/19/2012 13:14  Ref. Range 03/15/2012 21:37 03/16/2012 06:40 03/17/2012 05:40 09/19/2012 10:00  Hemoglobin Latest Range: 13.0-17.0 g/dL 08.6 (L) 57.8 (L) 46.9 (L) 11.1 (L)  HCT Latest Range: 39.0-52.0 % 33.8 (L) 34.2 (L) 33.7 (L) 33.1 (L)     1250:  Pt with several episodes of hemoptysis here in the ED. ED RN and ED Tech both witnessed pt cough and expectorate "a blob of blood." No active epistaxis while in the ED. Pt from ALF; will tx for HCAP. Dx and testing d/w pt and family.  Questions answered.  Verb understanding, agreeable to admit.  T/C to [facility]. Elvera Lennox, case discussed, including:  HPI, pertinent PM/SHx, VS/PE, dx testing, ED course and treatment: Agreeable to admit, requests to write temporary orders, obtain tele bed to team 6.   Laray Anger, DO 09/20/12 1214

## 2012-09-19 NOTE — ED Notes (Signed)
Per EMS:   Around 2pm yesterday, 4/24, pt began coughing up blood per nursing home staff [facility]).  EMS reports a wet cough.  116/64, 74 pulse regular, 16 resp.  Pt ambulates without any devices.  Pt refused to come to the ER last night and only agreed to come this morning because his son persuaded him, per EMS.  Pt is alert and oriented; able to answer questions. PT IS A DNR- ORDER IS IN CHART.  Hx: prostatic cancer, A-fib, DM II, Chronic kidney disease, edema (lower extremities).

## 2012-09-19 NOTE — Clinical Social Work Psychosocial (Signed)
     Clinical Social Work Department BRIEF PSYCHOSOCIAL ASSESSMENT 09/19/2012  Patient:  RODRICKUS, MIN     Account Number:  [id number]     Admit date:  09/19/2012  Clinical Social Worker:  Doree Albee  Date/Time:  09/19/2012 12:00 M  Referred by:  CSW  Date Referred:  09/19/2012 Referred for  ALF Placement   Other Referral:   Interview type:  Patient Other interview type:   and patient daughter [name]    PSYCHOSOCIAL DATA Living Status:  FACILITY Admitted from facility:  [facility] Level of care:  Assisted Living Primary support name:  [name] Primary support relationship to patient:  CHILD, ADULT Degree of support available:   and HCPOA    CURRENT CONCERNS Current Concerns  Post-Acute Placement   Other Concerns:    SOCIAL WORK ASSESSMENT / PLAN CSW met with pt and patient daughter at bedside. Patient is hard of hearing. CSW introduced self and csw role. Paitent and patient daughter shared patient is a resident at [facility] and plans to return when medically stable. Patient daughter shared that she is HCPOA for patient.   Assessment/plan status:  Psychosocial Support/Ongoing Assessment of Needs Other assessment/ plan:   Information/referral to community resources:   none identified at this time    PATIENTS/FAMILYS RESPONSE TO PLAN OF CARE: Patient and patient duaghter thanked csw for concern and support. Paitent plans to return to [facility] when medically stable.

## 2012-09-19 NOTE — ED Notes (Signed)
Pt. Walked up and down the hall and his oxygen level was 93% throughout the walk.

## 2012-09-19 NOTE — ED Notes (Signed)
Patient transported to X-ray 

## 2012-09-19 NOTE — Progress Notes (Signed)
ED Cm noted pt's Important message for medicare not signed CM assisted pt to sign form and placed in ED chart CM also assessed that son is POA executor, pt from [facility] and that he has been seen also by Drs making house calls [phone] CM spoke with pt who is HOH (hearing aid observed in left ear) Male family member at the bedside confirmed pt from assisted living and choice is to return back to facility upon d/c

## 2012-09-19 NOTE — Progress Notes (Signed)
CSW met with pt and patient daughter [name] who is patient HCPOA. Patient and pt daughter shared that patient is a resident at [facility] . Patient is hard of hearing. Patient shared that he would like to return to [facility]. CSW awaiting further medical evaluation to determine patient disposition needs.   Catha Gosselin, LCSWA  [phone] 09/19/2012.d 11:34am

## 2012-09-19 NOTE — Progress Notes (Signed)
ANTIBIOTIC CONSULT NOTE - INITIAL  Pharmacy Consult for Vanco x8 days and Cefepime Indication: Suspected PNA  Allergies  Allergen Reactions  . Accupril (Quinapril Hcl) Other (See Comments)    unknown  . Naproxen Rash    Patient Measurements: Height: 5\' 10"  (177.8 cm) (pt stated) Weight: 200 lb (90.719 kg) (pt stated wt) IBW/kg (Calculated) : 73  Vital Signs: Temp: 98.2 F (36.8 C) (04/25 0758) Temp src: Oral (04/25 0758) BP: 128/84 mmHg (04/25 1124) Pulse Rate: 81 (04/25 1124)  Labs:  Recent Labs  09/19/12 0900 09/19/12 1000  WBC SPECIMEN CLOTTED 12.4*  HGB SPECIMEN CLOTTED 11.1*  PLT SPECIMEN CLOTTED 182  CREATININE 1.78*  --    Estimated Creatinine Clearance: 26.9 ml/min (by C-G formula based on Cr of 1.78).   Medical History: Past Medical History  Diagnosis Date  . Prostate cancer   . Hypothyroidism   . Hyperglycemia   . DMII (diabetes mellitus, type 2)   . Prostate cancer, primary, with metastasis from prostate to other site 12/06/2011  . Prostate cancer metastatic to multiple sites   . A-fib   . CKD (chronic kidney disease)    Assessment: 79 yo M from [facility] admitted to [facility] on 09/19/12 with suspected PNA. Plan is to start Vanco x8 days plus cefepime. Will use pt-stated wt = 200 lbs (90.9kg) until wt can be obtained on floor. CrCl(n) ~ 24.  Goal of Therapy:  Vancomycin trough level 15-20 mcg/ml  Plan:  1) Cefepime 1g IV q24h 2)  Vanco 1g IV q24h  Darrol Angel, PharmD Pager: [phone] 09/19/2012,1:31 PM

## 2012-09-19 NOTE — ED Notes (Signed)
Lab called to report clotted CBC- will be collected and sent down.

## 2012-09-20 DIAGNOSIS — IMO0002 Reserved for concepts with insufficient information to code with codable children: Secondary | ICD-10-CM

## 2012-09-20 LAB — CBC
HCT: 35.7 % — ABNORMAL LOW (ref 39.0–52.0)
Hemoglobin: 11.9 g/dL — ABNORMAL LOW (ref 13.0–17.0)
RBC: 3.92 MIL/uL — ABNORMAL LOW (ref 4.22–5.81)
RDW: 15.1 % (ref 11.5–15.5)
WBC: 12.7 10*3/uL — ABNORMAL HIGH (ref 4.0–10.5)

## 2012-09-20 LAB — COMPREHENSIVE METABOLIC PANEL
BUN: 30 mg/dL — ABNORMAL HIGH (ref 6–23)
Calcium: 9.3 mg/dL (ref 8.4–10.5)
Creatinine, Ser: 1.52 mg/dL — ABNORMAL HIGH (ref 0.50–1.35)
GFR calc Af Amer: 42 mL/min — ABNORMAL LOW (ref >90)
Glucose, Bld: 87 mg/dL (ref 70–99)
Total Protein: 6 g/dL (ref 6.0–8.3)

## 2012-09-20 LAB — LEGIONELLA ANTIGEN, URINE: Legionella Antigen, Urine: NEGATIVE

## 2012-09-20 LAB — GLUCOSE, CAPILLARY: Glucose-Capillary: 76 mg/dL (ref 70–99)

## 2012-09-20 LAB — STREP PNEUMONIAE URINARY ANTIGEN: Strep Pneumo Urinary Antigen: NEGATIVE

## 2012-09-20 MED ORDER — SODIUM CHLORIDE 0.65 % NA SOLN: 1.0000 | NASAL | Status: DC | PRN

## 2012-09-20 MED ORDER — LEVOFLOXACIN 500 MG PO TABS: 500.0000 mg | ORAL_TABLET | ORAL | Status: DC

## 2012-09-20 NOTE — Discharge Summary (Addendum)
[facility]                                                                                   [name], is a 79 y.o. male  DOB [birth year]  MRN [id number].  Admission date:  09/19/2012  Discharge Date:  09/20/2012  Primary MD  Sissy Hoff, MD  Admitting Physician  Pamella Pert, MD  Admission Diagnosis  Atrial flutter [427.32] Epistaxis [784.7] HTN (hypertension) [401.9] Hypothyroidism [244.9] Acute renal failure [584.9] DM (diabetes mellitus) [250.00] HCAP (healthcare-associated pneumonia) [486] Hemoptysis [786.30]  Discharge Diagnosis     Active Problems:   Atrial flutter   HTN (hypertension)   DM (diabetes mellitus)   Acute renal failure   HCAP (healthcare-associated pneumonia)    Past Medical History  Diagnosis Date  . Prostate cancer   . Hypothyroidism   . Hyperglycemia   . DMII (diabetes mellitus, type 2)   . Prostate cancer, primary, with metastasis from prostate to other site 12/06/2011  . Prostate cancer metastatic to multiple sites   . A-fib   . CKD (chronic kidney disease)     Past Surgical History  Procedure Laterality Date  . Melanoma removed from left leg  1971    x 14  . Left cataract extraction    . Prostate surgery    . Sinus exploration       Recommendations for primary care physician for things to follow:    repeat CBC BMP in 3 days, repeat 2 view chest x-ray in a week.   Discharge Diagnoses:   Active Problems:   Atrial flutter   HTN (hypertension)   DM (diabetes mellitus)   Acute renal failure   HCAP (healthcare-associated pneumonia)    Discharge Condition: Stable   Diet recommendation: See Discharge Instructions below   Consults      History of present illness and  Hospital Course:     Kindly see H&P for history of present illness and admission details, please review complete Labs, Consult reports and Test reports for all details in brief [name], is a 79 y.o. male, with history of CAD,  diabetes mellitus type 2, hypertension, atrial flutter who lives at an assisted living facility was admitted yesterday after he had a severe episode of nosebleed along with possible healthcare associated pneumonia in the left upper lobe, patient does keep his room heart and dry, he does pick at his nose this was obtained after interviewing patient's daughter-in-law, his nosebleed has since resolved, he has been provided with saline nasal spray and requested to keep a humidifier in his room at the assisted living facility, daughter-in-law in agreement, if nosebleed reoccurs he should see an ENT physician . I have counseled the patient on not picking his nose.   As for his possible healthcare associated pneumonia he is afebrile, no oxygen need, no cough or shortness of breath, he does have mild leukocytosis and I will place him on 5 more doses of Levaquin which for his creatinine clearance will be 500 every 48 hours giving him 10 days of treatment. We'll request his PCP to please repeat a two-view chest x-ray in 7-10 days time frame to document  clearance of the infiltrate.    His chronic issues i.e. CAD, hypertension and diabetes mellitus a stable home medications will be resumed unchanged.     Today   Subjective:   [name] today has no headache,no chest abdominal pain,no new weakness tingling or numbness, feels much better wants to go home today.   Objective:   Blood pressure 151/71, pulse 80, temperature 97.7 F (36.5 C), temperature source Oral, resp. rate 18, height 5\' 10"  (1.778 m), weight 84.5 kg (186 lb 4.6 oz), SpO2 98.00%.   Intake/Output Summary (Last 24 hours) at 09/20/12 1020 Last data filed at 09/20/12 0619  Gross per 24 hour  Intake    240 ml  Output    900 ml  Net   -660 ml    Exam Awake Alert, Oriented *3, No new F.N deficits, Normal affect Elida.AT,PERRAL Supple Neck,No JVD, No cervical lymphadenopathy appriciated.  Symmetrical Chest wall movement, Good air  movement bilaterally, CTAB RRR,No Gallops,Rubs or new Murmurs, No Parasternal Heave +ve B.Sounds, Abd Soft, Non tender, No organomegaly appriciated, No rebound -guarding or rigidity. No Cyanosis, Clubbing or edema, No new Rash or bruise, he has chronic lesion of dictation in discoloration on his right leg due to over 15 melanoma surgeries in the past says this is all old and chronic.  Data Review   Major procedures and Radiology Reports - PLEASE review detailed and final reports for all details in brief -       Dg Chest 2 View  09/19/2012  *RADIOLOGY REPORT*  Clinical Data: Hemoptysis, cough  CHEST - 2 VIEW  Comparison: March 14, 2012.  Findings: Stable mild cardiomegaly.  Minimal left pleural effusion is noted and smaller compared to prior exam.  Faint left upper lobe opacity is noted suggesting possible pneumonia. Right lung is clear.  No pneumothorax is noted.  Bony thorax intact.  IMPRESSION: Minimal left pleural effusion.  Ill-defined mild left upper lobe opacity is noted concerning for pneumonia or subsegmental atelectasis; follow-up radiographs are recommended to ensure resolution.   Original Report Authenticated By: Lupita Raider.,  M.D.     Micro Results      No results found for this or any previous visit (from the past 240 hour(s)).   CBC w Diff: Lab Results  Component Value Date   WBC 12.7* 09/20/2012   HGB 11.9* 09/20/2012   HCT 35.7* 09/20/2012   PLT 177 09/20/2012   LYMPHOPCT 23 09/19/2012   BANDSPCT SPECIMEN CLOTTED 09/19/2012   MONOPCT 11 09/19/2012   EOSPCT 5 09/19/2012   BASOPCT 0 09/19/2012    CMP: Lab Results  Component Value Date   NA 137 09/20/2012   K 4.6 09/20/2012   CL 100 09/20/2012   CO2 28 09/20/2012   BUN 30* 09/20/2012   CREATININE 1.52* 09/20/2012   PROT 6.0 09/20/2012   ALBUMIN 3.0* 09/20/2012   BILITOT 0.6 09/20/2012   ALKPHOS 76 09/20/2012   AST 20 09/20/2012   ALT 12 09/20/2012  .   Discharge Instructions     Follow with Primary MD Sissy Hoff, MD in 3 days   Get CBC, CMP, checked 3 days by Primary MD and again as instructed by your Primary MD. Get a 2 view Chest X ray done next visit .  Get Medicines reviewed and adjusted.  Please request your Prim.MD to go over all Hospital Tests and Procedure/Radiological results at the follow up, please get all Hospital records sent to your Prim MD by signing hospital release  before you go home.  Activity: As tolerated with Full fall precautions use walker/cane & assistance as needed   Diet:  Heart healthy - Low carb with full Aspiration precautions.  For Heart failure patients - Check your Weight same time everyday, if you gain over 2 pounds, or you develop in leg swelling, experience more shortness of breath or chest pain, call your Primary MD immediately. Follow Cardiac Low Salt Diet and 1.8 lit/day fluid restriction.  Disposition ALF  If you experience worsening of your admission symptoms, develop shortness of breath, life threatening emergency, suicidal or homicidal thoughts you must seek medical attention immediately by calling 911 or calling your MD immediately  if symptoms less severe.  You Must read complete instructions/literature along with all the possible adverse reactions/side effects for all the Medicines you take and that have been prescribed to you. Take any new Medicines after you have completely understood and accpet all the possible adverse reactions/side effects.   Do not drive and provide baby sitting services if your were admitted for syncope or siezures until you have seen by Primary MD or a Neurologist and advised to do so again.  Do not drive when taking Pain medications.    Do not take more than prescribed Pain, Sleep and Anxiety Medications  Special Instructions: If you have smoked or chewed Tobacco  in the last 2 yrs please stop smoking, stop any regular Alcohol  and or any Recreational drug use.  Wear Seat belts while driving.   Follow-up Information    Follow up with Sissy Hoff, MD. Schedule an appointment as soon as possible for a visit in 3 days.   Contact information:   [address] [place] [address] [phone]         Discharge Medications     Medication List    TAKE these medications       acetaminophen 500 MG tablet  Commonly known as:  TYLENOL  Take 500 mg by mouth every 4 (four) hours as needed. Fever/pain     aspirin 81 MG chewable tablet  Chew 81 mg by mouth daily.     atorvastatin 10 MG tablet  Commonly known as:  LIPITOR  Take 1 tablet (10 mg total) by mouth daily at 6 PM.     beta carotene w/minerals tablet  Take 1 tablet by mouth daily.     diltiazem 180 MG 24 hr capsule  Commonly known as:  CARDIZEM CD  Take 1 capsule (180 mg total) by mouth daily.     feeding supplement Liqd  Take 237 mLs by mouth daily at 12 noon.     furosemide 40 MG tablet  Commonly known as:  LASIX  Take 1 tablet (40 mg total) by mouth daily.     hydrOXYzine 25 MG tablet  Commonly known as:  ATARAX/VISTARIL  Take 25 mg by mouth at bedtime as needed. For insomnia     insulin glargine 100 UNIT/ML injection  Commonly known as:  LANTUS  Inject 20 Units into the skin at bedtime.     insulin lispro 100 UNIT/ML injection  Commonly known as:  HUMALOG  Inject 5 Units into the skin 3 (three) times daily before meals.     IOPHEN-NR 100 MG/5ML liquid  Generic drug:  guaiFENesin  Take 200 mg by mouth every 6 (six) hours as needed. For cough     isosorbide mononitrate 30 MG 24 hr tablet  Commonly known as:  IMDUR  Take 1 tablet (30  mg total) by mouth daily.     levofloxacin 500 MG tablet  Commonly known as:  LEVAQUIN  Take 1 tablet (500 mg total) by mouth every other day.     levothyroxine 112 MCG tablet  Commonly known as:  SYNTHROID, LEVOTHROID  Take 112 mcg by mouth daily.     LORazepam 0.5 MG tablet  Commonly known as:  ATIVAN  Take 0.5 mg by mouth at bedtime as needed. Sleeping     metoprolol 50 MG  tablet  Commonly known as:  LOPRESSOR  Take 1 tablet (50 mg total) by mouth 2 (two) times daily.     nitroGLYCERIN 0.4 MG SL tablet  Commonly known as:  NITROSTAT  Place 1 tablet (0.4 mg total) under the tongue every 5 (five) minutes x 3 doses as needed for chest pain.     sodium chloride 0.65 % nasal spray  Commonly known as:  OCEAN  Place 1 spray into the nose as needed for congestion.     tamsulosin 0.4 MG Caps  Commonly known as:  FLOMAX  Take 0.4 mg by mouth daily.     Vitamin D 2000 UNITS Caps  Take 1 capsule by mouth daily.           Total Time in preparing paper work, data evaluation and todays exam - 35 minutes  Leroy Sea M.D on 09/20/2012 at 10:20 AM  [facility] Office  [phone]

## 2012-09-20 NOTE — Progress Notes (Signed)
Per MD, Pt ready for d/c.  Notified RN, Pt and facility.  Pt stated that son and daughter-in-law are aware that Pt to return to [facility] today, thus no reason for CSW to notify them.  Sent d/c summary and FL2  Facility ready to receive Pt.  Arranged for transportation.  Providence Crosby, LCSWA Clinical Social Work [phone]

## 2012-09-22 LAB — GLUCOSE, CAPILLARY: Glucose-Capillary: 175 mg/dL — ABNORMAL HIGH (ref 70–99)

## 2012-12-17 ENCOUNTER — Encounter (HOSPITAL_COMMUNITY): Payer: Self-pay | Admitting: Emergency Medicine

## 2012-12-17 ENCOUNTER — Inpatient Hospital Stay (HOSPITAL_COMMUNITY)
Admission: EM | Admit: 2012-12-17 | Discharge: 2012-12-20 | DRG: 872 | Disposition: A | Discharge status: Skilled Nursing Facility | Payer: Medicare Other | Attending: Internal Medicine | Admitting: Internal Medicine

## 2012-12-17 DIAGNOSIS — E119 Type 2 diabetes mellitus without complications: Secondary | ICD-10-CM | POA: Diagnosis present

## 2012-12-17 DIAGNOSIS — N179 Acute kidney failure, unspecified: Secondary | ICD-10-CM | POA: Diagnosis present

## 2012-12-17 DIAGNOSIS — L02419 Cutaneous abscess of limb, unspecified: Secondary | ICD-10-CM | POA: Diagnosis present

## 2012-12-17 DIAGNOSIS — N400 Enlarged prostate without lower urinary tract symptoms: Secondary | ICD-10-CM | POA: Diagnosis present

## 2012-12-17 DIAGNOSIS — L03116 Cellulitis of left lower limb: Secondary | ICD-10-CM | POA: Diagnosis present

## 2012-12-17 DIAGNOSIS — D72829 Elevated white blood cell count, unspecified: Secondary | ICD-10-CM | POA: Diagnosis present

## 2012-12-17 DIAGNOSIS — R000 Tachycardia, unspecified: Secondary | ICD-10-CM | POA: Diagnosis present

## 2012-12-17 DIAGNOSIS — L02619 Cutaneous abscess of unspecified foot: Secondary | ICD-10-CM | POA: Diagnosis present

## 2012-12-17 DIAGNOSIS — I129 Hypertensive chronic kidney disease with stage 1 through stage 4 chronic kidney disease, or unspecified chronic kidney disease: Secondary | ICD-10-CM | POA: Diagnosis present

## 2012-12-17 DIAGNOSIS — E039 Hypothyroidism, unspecified: Secondary | ICD-10-CM | POA: Diagnosis present

## 2012-12-17 DIAGNOSIS — L03115 Cellulitis of right lower limb: Secondary | ICD-10-CM

## 2012-12-17 DIAGNOSIS — A419 Sepsis, unspecified organism: Principal | ICD-10-CM | POA: Diagnosis present

## 2012-12-17 DIAGNOSIS — I10 Essential (primary) hypertension: Secondary | ICD-10-CM | POA: Diagnosis present

## 2012-12-17 DIAGNOSIS — L03119 Cellulitis of unspecified part of limb: Secondary | ICD-10-CM | POA: Diagnosis present

## 2012-12-17 DIAGNOSIS — Z66 Do not resuscitate: Secondary | ICD-10-CM | POA: Diagnosis present

## 2012-12-17 DIAGNOSIS — N183 Chronic kidney disease, stage 3 unspecified: Secondary | ICD-10-CM | POA: Diagnosis present

## 2012-12-17 DIAGNOSIS — M79609 Pain in unspecified limb: Secondary | ICD-10-CM

## 2012-12-17 LAB — BASIC METABOLIC PANEL
CO2: 27 mEq/L (ref 19–32)
Calcium: 9.2 mg/dL (ref 8.4–10.5)
Chloride: 96 mEq/L (ref 96–112)
Chloride: 96 mEq/L (ref 96–112)
Creatinine, Ser: 1.59 mg/dL — ABNORMAL HIGH (ref 0.50–1.35)
GFR calc Af Amer: 40 mL/min — ABNORMAL LOW (ref >90)
Potassium: 4.7 mEq/L (ref 3.5–5.1)
Potassium: 4.1 mEq/L (ref 3.5–5.1)
Sodium: 135 mEq/L (ref 135–145)
Sodium: 134 mEq/L — ABNORMAL LOW (ref 135–145)

## 2012-12-17 LAB — CBC WITH DIFFERENTIAL/PLATELET
Basophils Absolute: 0 10*3/uL (ref 0.0–0.1)
Basophils Relative: 0 % (ref 0–1)
HCT: 41.5 % (ref 39.0–52.0)
Hemoglobin: 13.9 g/dL (ref 13.0–17.0)
Lymphs Abs: 3.5 10*3/uL (ref 0.7–4.0)
MCH: 30.2 pg (ref 26.0–34.0)
MCHC: 33.5 g/dL (ref 30.0–36.0)
MCV: 90.2 fL (ref 78.0–100.0)
Neutro Abs: 24.2 10*3/uL — ABNORMAL HIGH (ref 1.7–7.7)

## 2012-12-17 LAB — APTT: aPTT: 30 seconds (ref 24–37)

## 2012-12-17 LAB — PROTIME-INR
INR: 1.04 (ref 0.00–1.49)
Prothrombin Time: 13.4 seconds (ref 11.6–15.2)

## 2012-12-17 LAB — MRSA PCR SCREENING: MRSA by PCR: NEGATIVE

## 2012-12-17 LAB — PHOSPHORUS: Phosphorus: 3.1 mg/dL (ref 2.3–4.6)

## 2012-12-17 LAB — LACTIC ACID, PLASMA: Lactic Acid, Venous: 2 mmol/L (ref 0.5–2.2)

## 2012-12-17 MED ORDER — INSULIN ASPART 100 UNIT/ML ~~LOC~~ SOLN
0.0000 [IU] | Freq: Three times a day (TID) | SUBCUTANEOUS | Status: DC
  Administered 2012-12-18: 2 [IU] via SUBCUTANEOUS
  Administered 2012-12-18: 5 [IU] via SUBCUTANEOUS
  Administered 2012-12-18: 3 [IU] via SUBCUTANEOUS
  Administered 2012-12-19: 2 [IU] via SUBCUTANEOUS
  Administered 2012-12-19 – 2012-12-20 (×3): 3 [IU] via SUBCUTANEOUS

## 2012-12-17 MED ORDER — PREDNISOLONE ACETATE 1 % OP SUSP
1.0000 [drp] | Freq: Every day | OPHTHALMIC | Status: DC
  Administered 2012-12-18 – 2012-12-20 (×3): 1 [drp] via OPHTHALMIC
  Filled 2012-12-17: qty 1

## 2012-12-17 MED ORDER — NITROGLYCERIN 0.4 MG SL SUBL: 0.4000 mg | SUBLINGUAL_TABLET | SUBLINGUAL | Status: DC | PRN

## 2012-12-17 MED ORDER — LOPERAMIDE HCL 2 MG PO CAPS: 2.0000 mg | ORAL_CAPSULE | ORAL | Status: DC | PRN

## 2012-12-17 MED ORDER — LEVOTHYROXINE SODIUM 112 MCG PO TABS
112.0000 ug | ORAL_TABLET | Freq: Every day | ORAL | Status: DC
  Administered 2012-12-18 – 2012-12-20 (×3): 112 ug via ORAL
  Filled 2012-12-17 (×4): qty 1

## 2012-12-17 MED ORDER — ALUM & MAG HYDROXIDE-SIMETH 200-200-20 MG/5ML PO SUSP: 30.0000 mL | Freq: Four times a day (QID) | ORAL | Status: DC | PRN

## 2012-12-17 MED ORDER — ISOSORBIDE MONONITRATE ER 30 MG PO TB24: 30.0000 mg | ORAL_TABLET | Freq: Every day | ORAL | Status: DC

## 2012-12-17 MED ORDER — SODIUM CHLORIDE 0.9 % IV SOLN
INTRAVENOUS | Status: DC
  Administered 2012-12-18 (×2): via INTRAVENOUS

## 2012-12-17 MED ORDER — DIFLUPREDNATE 0.05 % OP EMUL: 1.0000 [drp] | Freq: Every day | OPHTHALMIC | Status: DC

## 2012-12-17 MED ORDER — MAGNESIUM HYDROXIDE 400 MG/5ML PO SUSP: 30.0000 mL | Freq: Every evening | ORAL | Status: DC | PRN

## 2012-12-17 MED ORDER — GUAIFENESIN 100 MG/5ML PO SOLN: 200.0000 mg | Freq: Four times a day (QID) | ORAL | Status: DC | PRN

## 2012-12-17 MED ORDER — LORAZEPAM 0.5 MG PO TABS: 0.5000 mg | ORAL_TABLET | Freq: Every evening | ORAL | Status: DC | PRN

## 2012-12-17 MED ORDER — IBUPROFEN 200 MG PO TABS
600.0000 mg | ORAL_TABLET | Freq: Once | ORAL | Status: AC
  Administered 2012-12-17: 600 mg via ORAL
  Filled 2012-12-17: qty 3

## 2012-12-17 MED ORDER — OCUVITE-LUTEIN PO CAPS
1.0000 | ORAL_CAPSULE | Freq: Every day | ORAL | Status: DC
  Administered 2012-12-18 – 2012-12-20 (×3): 1 via ORAL
  Filled 2012-12-17 (×3): qty 1

## 2012-12-17 MED ORDER — VITAMIN D (ERGOCALCIFEROL) 1.25 MG (50000 UNIT) PO CAPS: 50000.0000 [IU] | ORAL_CAPSULE | ORAL | Status: DC

## 2012-12-17 MED ORDER — TAMSULOSIN HCL 0.4 MG PO CAPS
0.4000 mg | ORAL_CAPSULE | Freq: Every day | ORAL | Status: DC
  Administered 2012-12-17 – 2012-12-20 (×4): 0.4 mg via ORAL
  Filled 2012-12-17 (×4): qty 1

## 2012-12-17 MED ORDER — METOPROLOL TARTRATE 50 MG PO TABS
50.0000 mg | ORAL_TABLET | Freq: Two times a day (BID) | ORAL | Status: DC
  Filled 2012-12-17: qty 1

## 2012-12-17 MED ORDER — SODIUM CHLORIDE 0.9 % IV SOLN
Freq: Once | INTRAVENOUS | Status: AC
  Administered 2012-12-17: 13:00:00 via INTRAVENOUS

## 2012-12-17 MED ORDER — INSULIN GLARGINE 100 UNIT/ML ~~LOC~~ SOLN
20.0000 [IU] | Freq: Every day | SUBCUTANEOUS | Status: DC
  Administered 2012-12-17 – 2012-12-19 (×3): 20 [IU] via SUBCUTANEOUS
  Filled 2012-12-17 (×4): qty 0.2

## 2012-12-17 MED ORDER — MORPHINE SULFATE 2 MG/ML IJ SOLN
1.0000 mg | INTRAMUSCULAR | Status: DC | PRN
  Administered 2012-12-19 (×2): 1 mg via INTRAVENOUS
  Filled 2012-12-17 (×2): qty 1

## 2012-12-17 MED ORDER — VANCOMYCIN HCL IN DEXTROSE 1-5 GM/200ML-% IV SOLN
1000.0000 mg | Freq: Once | INTRAVENOUS | Status: AC
  Administered 2012-12-17: 1000 mg via INTRAVENOUS
  Filled 2012-12-17: qty 200

## 2012-12-17 MED ORDER — VANCOMYCIN HCL IN DEXTROSE 750-5 MG/150ML-% IV SOLN
750.0000 mg | INTRAVENOUS | Status: DC
  Administered 2012-12-18 – 2012-12-19 (×2): 750 mg via INTRAVENOUS
  Filled 2012-12-17 (×5): qty 150

## 2012-12-17 MED ORDER — SALINE SPRAY 0.65 % NA SOLN: 1.0000 | NASAL | Status: DC | PRN

## 2012-12-17 MED ORDER — SODIUM CHLORIDE (HYPERTONIC) 5 % OP SOLN
1.0000 [drp] | Freq: Four times a day (QID) | OPHTHALMIC | Status: DC
  Administered 2012-12-17 – 2012-12-20 (×11): 1 [drp] via OPHTHALMIC
  Filled 2012-12-17 (×2): qty 15

## 2012-12-17 MED ORDER — DILTIAZEM HCL ER COATED BEADS 180 MG PO CP24: 180.0000 mg | ORAL_CAPSULE | Freq: Every day | ORAL | Status: DC

## 2012-12-17 MED ORDER — PIPERACILLIN-TAZOBACTAM 3.375 G IVPB
3.3750 g | Freq: Once | INTRAVENOUS | Status: AC
  Administered 2012-12-17: 3.375 g via INTRAVENOUS
  Filled 2012-12-17: qty 50

## 2012-12-17 MED ORDER — HYDROCODONE-ACETAMINOPHEN 5-325 MG PO TABS
1.0000 | ORAL_TABLET | ORAL | Status: DC | PRN
Start: 2012-12-17 — End: 2012-12-20
  Administered 2012-12-19: 2 via ORAL
  Filled 2012-12-17: qty 2

## 2012-12-17 MED ORDER — PIPERACILLIN-TAZOBACTAM 3.375 G IVPB
3.3750 g | Freq: Three times a day (TID) | INTRAVENOUS | Status: DC
  Filled 2012-12-17: qty 50

## 2012-12-17 MED ORDER — PIPERACILLIN-TAZOBACTAM 3.375 G IVPB
3.3750 g | Freq: Three times a day (TID) | INTRAVENOUS | Status: DC
  Administered 2012-12-17 – 2012-12-20 (×7): 3.375 g via INTRAVENOUS
  Filled 2012-12-17 (×12): qty 50

## 2012-12-17 MED ORDER — ONDANSETRON HCL 4 MG PO TABS: 4.0000 mg | ORAL_TABLET | Freq: Four times a day (QID) | ORAL | Status: DC | PRN

## 2012-12-17 MED ORDER — ONDANSETRON HCL 4 MG/2ML IJ SOLN: 4.0000 mg | Freq: Four times a day (QID) | INTRAMUSCULAR | Status: DC | PRN

## 2012-12-17 NOTE — ED Provider Notes (Signed)
History    CSN: [id number] Arrival date & time 12/17/12  1118  First MD Initiated Contact with Patient 12/17/12 1141     Chief Complaint  Patient presents with  . Leg Pain   (Consider location/radiation/quality/duration/timing/severity/associated sxs/prior Treatment) The history is provided by the patient.   patient reports worsening right lower extremity pain since yesterday.  He has a history of cellulitis in his right lower extremity and is concerned that he has recurrent cellulitis in his right lower extremity.  He has a history of melanoma to his right heel that was treated many years ago with localized chemotherapy to his entire right lower extremity.  This resulted in chronic skin changes and discoloration of his right lower extremity.  He has chronic wounds in this region.  He reports fever and chills yesterday.  He denies abdominal pain.  No nausea or vomiting.  His symptoms are mild to moderate in severity.  Nothing worsens or improves his symptoms   Past Medical History  Diagnosis Date  . Prostate cancer   . Hypothyroidism   . Hyperglycemia   . DMII (diabetes mellitus, type 2)   . Prostate cancer, primary, with metastasis from prostate to other site 12/06/2011  . Prostate cancer metastatic to multiple sites   . A-fib   . CKD (chronic kidney disease)    Past Surgical History  Procedure Laterality Date  . Melanoma removed from left leg  1971    x 14  . Left cataract extraction    . Prostate surgery    . Sinus exploration     Family History  Problem Relation Age of Onset  . CAD Father 69   History  Substance Use Topics  . Smoking status: Never Smoker   . Smokeless tobacco: Never Used  . Alcohol Use: No    Review of Systems  All other systems reviewed and are negative.    Allergies  Accupril and Naproxen  Home Medications   Current Outpatient Rx  Name  Route  Sig  Dispense  Refill  . alum & mag hydroxide-simeth (MAALOX/MYLANTA) 200-200-20 MG/5ML  suspension   Oral   Take 30 mLs by mouth every 6 (six) hours as needed for indigestion.         . beta carotene w/minerals (OCUVITE) tablet   Oral   Take 1 tablet by mouth daily.         . Difluprednate (DUREZOL) 0.05 % EMUL   Ophthalmic   Apply 1 drop to eye daily.         Marland Kitchen diltiazem (CARDIZEM CD) 180 MG 24 hr capsule   Oral   Take 1 capsule (180 mg total) by mouth daily.         . furosemide (LASIX) 40 MG tablet   Oral   Take 1 tablet (40 mg total) by mouth daily.   30 tablet   11   . guaiFENesin (IOPHEN-NR) 100 MG/5ML liquid   Oral   Take 200 mg by mouth every 6 (six) hours as needed. For cough         . insulin glargine (LANTUS) 100 UNIT/ML injection   Subcutaneous   Inject 20 Units into the skin at bedtime.         . insulin lispro (HUMALOG) 100 UNIT/ML injection   Subcutaneous   Inject 5 Units into the skin as needed for high blood sugar (As needed for FSBS over 350).         . isosorbide mononitrate (IMDUR)  30 MG 24 hr tablet   Oral   Take 1 tablet (30 mg total) by mouth daily.   30 tablet   11   . levothyroxine (SYNTHROID, LEVOTHROID) 112 MCG tablet   Oral   Take 112 mcg by mouth daily.         Marland Kitchen loperamide (IMODIUM) 2 MG capsule   Oral   Take 2 mg by mouth as needed for diarrhea or loose stools.         . magnesium hydroxide (MILK OF MAGNESIA) 400 MG/5ML suspension   Oral   Take 30 mLs by mouth at bedtime as needed for constipation.         . metoprolol (LOPRESSOR) 50 MG tablet   Oral   Take 1 tablet (50 mg total) by mouth 2 (two) times daily.         . sodium chloride (MURO 128) 5 % ophthalmic solution   Left Eye   Place 1 drop into the left eye 4 (four) times daily.         . Tamsulosin HCl (FLOMAX) 0.4 MG CAPS   Oral   Take 0.4 mg by mouth daily.         . Vitamin D, Ergocalciferol, (DRISDOL) 50000 UNITS CAPS   Oral   Take 50,000 Units by mouth.         Marland Kitchen acetaminophen (TYLENOL) 500 MG tablet   Oral   Take  500 mg by mouth every 4 (four) hours as needed. Fever/pain         . LORazepam (ATIVAN) 0.5 MG tablet   Oral   Take 0.5 mg by mouth at bedtime as needed. Sleeping         . nitroGLYCERIN (NITROSTAT) 0.4 MG SL tablet   Sublingual   Place 1 tablet (0.4 mg total) under the tongue every 5 (five) minutes x 3 doses as needed for chest pain.   25 tablet   6   . sodium chloride (OCEAN) 0.65 % nasal spray   Nasal   Place 1 spray into the nose as needed for congestion.   15 mL   12    BP 151/73  Pulse 102  Temp(Src) 102.4 F (39.1 C) (Rectal)  Resp 15  SpO2 97% Physical Exam  Nursing note and vitals reviewed. Constitutional: He is oriented to person, place, and time. He appears well-developed and well-nourished.  HENT:  Head: Normocephalic and atraumatic.  Eyes: EOM are normal.  Neck: Normal range of motion.  Cardiovascular: Normal rate, regular rhythm, normal heart sounds and intact distal pulses.   Pulmonary/Chest: Effort normal and breath sounds normal. No respiratory distress.  Abdominal: Soft. He exhibits no distension. There is no tenderness.  Genitourinary: Rectum normal.  Musculoskeletal: Normal range of motion.  Chronic discoloration of his right lower extremity.  Normal pulses in his right foot.  Significant erythema warmth and tenderness from his right ankle up to his right mid thigh.  No fluctuance or drainage noted.  Right leg consistent with cellulitis Right lower extremity is swollen as compared to the left  Neurological: He is alert and oriented to person, place, and time.  Skin: Skin is warm and dry.  Psychiatric: He has a normal mood and affect. Judgment normal.    ED Course  Procedures (including critical care time) Labs Reviewed  CBC WITH DIFFERENTIAL - Abnormal; Notable for the following:    WBC 29.2 (*)    All other components within normal limits  BASIC METABOLIC PANEL -  Abnormal; Notable for the following:    Glucose, Bld 261 (*)    BUN 32 (*)     Creatinine, Ser 1.59 (*)    GFR calc non Af Amer 35 (*)    GFR calc Af Amer 40 (*)    All other components within normal limits  CULTURE, BLOOD (ROUTINE X 2)  CULTURE, BLOOD (ROUTINE X 2)  LACTIC ACID, PLASMA   No results found. 1. Cellulitis of right lower extremity     MDM  Patient presents with a fever of 102.4 as well as a significant cellulitis of his nearly entire right lower extremity.  Normal pulses in his right foot.  Ultrasound pending at this time to evaluate for DVT however my suspicion is low.  The patient restarted him back to mycins Zosyn.  He is a DO NOT RESUSCITATE.  His family however does otherwise want antibiotics.  He is a diabetic.  He will be admitted for ongoing care.  Lyanne Co, MD 12/17/12 1357

## 2012-12-17 NOTE — Progress Notes (Signed)
*  Preliminary Results* Right lower extremity venous duplex completed. The study was technically difficult due to edema and patient positioning. There is no obvious evidence of right lower extremity deep vein thrombosis, however due to technical limitations this study is inconclusive.  Preliminary results discussed with Dr.Campos.  12/17/2012 2:28 PM  Gertie Fey, RVT, RDCS, RDMS

## 2012-12-17 NOTE — H&P (Signed)
[facility] History and Physical  ASAHEL RISDEN ZOX:[id number] DOB: 1914-03-19 DOA: 12/17/2012  Referring physician: ER physician PCP: Sissy Hoff, MD   Chief Complaint: fever  HPI:  79 year old male with past medical history of hypertension, hypothyroidism, BPH, DM who presented to [facility] ED with worsening pain in his right leg for past 1-2 days prior to this admission. Patient reported associated subjective fever and chills. No complaints of falls. No chest pain, no shortness of breath, no palpitations. No abdominal pain, no nausea and no vomiting. In ED,  BP is on soft side, 96/43, HR 94 and T 102.4 F, O2 saturation is 97% on room air. CBC revealed leukocytosis of 29.2 and creatinine of 1.59.  His right lower extremity was markedly swollen and erythematous.   Assessment and Plan:  Principal Problem:   Cellulitis of left lower extremity - erythema and swelling extending to the knee - continue evancomycin - appreciate wound care consult - LE venous doppler with no obvious evidence of DVT   Possible sepsis - will add zosyn for broader coverage in addition to vanco since BP on soft side and there is a marked elevation in WBC count  Active Problems:   Hypothyroidism - continue levothyroxine   HTN (hypertension) - held Cardizem, metoprolol and Imdur due to low BP, 96/43   DM (diabetes mellitus) - continue Lantus 20 units at bedtime - add sliding scale inusulin   BPH (benign prostatic hyperplasia) - continue flomax   Acute renal failure - perhaps due to lasix - lasix on hold   Leukocytosis, unspecified - secondary to cellulitis - continue vanco  Manson Passey [facility] [phone]   Review of Systems:  Constitutional: Negative for fever, chills and malaise/fatigue. Negative for diaphoresis.  HENT: Negative for hearing loss, ear pain, nosebleeds, congestion, sore throat, neck pain, tinnitus and ear discharge.   Eyes: Negative for blurred vision, double vision, photophobia,  pain, discharge and redness.  Respiratory: Negative for cough, hemoptysis, sputum production, shortness of breath, wheezing and stridor.   Cardiovascular: Negative for chest pain, palpitations, orthopnea, claudication and leg swelling.  Gastrointestinal: Negative for nausea, vomiting and abdominal pain. Negative for heartburn, constipation, blood in stool and melena.  Genitourinary: Negative for dysuria, urgency, frequency, hematuria and flank pain.  Musculoskeletal: Negative for myalgias, back pain, joint pain and falls.  Skin: right lower extremity swelling and redness.  Neurological: Negative for dizziness and weakness. Negative for tingling, tremors, sensory change, speech change, focal weakness, loss of consciousness and headaches.  Endo/Heme/Allergies: Negative for environmental allergies and polydipsia. Does not bruise/bleed easily.    Past Medical History  Diagnosis Date  . Prostate cancer   . Hypothyroidism   . Hyperglycemia   . DMII (diabetes mellitus, type 2)   . Prostate cancer, primary, with metastasis from prostate to other site 12/06/2011  . Prostate cancer metastatic to multiple sites   . A-fib   . CKD (chronic kidney disease)    Past Surgical History  Procedure Laterality Date  . Melanoma removed from left leg  1971    x 14  . Left cataract extraction    . Prostate surgery    . Sinus exploration     Social History:  reports that he has never smoked. He has never used smokeless tobacco. He reports that he does not drink alcohol or use illicit drugs.  Allergies  Allergen Reactions  . Accupril (Quinapril Hcl) Other (See Comments)    unknown  . Naproxen Rash    Family  History  Problem Relation Age of Onset  . CAD Father 67     Prior to Admission medications   Medication Sig Start Date End Date Taking? Authorizing Provider  alum & mag hydroxide-simeth (MAALOX/MYLANTA) 200-200-20 MG/5ML suspension Take 30 mLs by mouth every 6 (six) hours as needed for  indigestion.   Yes Historical Provider, MD  beta carotene w/minerals (OCUVITE) tablet Take 1 tablet by mouth daily.   Yes Historical Provider, MD  Difluprednate (DUREZOL) 0.05 % EMUL Apply 1 drop to eye daily.   Yes Historical Provider, MD  diltiazem (CARDIZEM CD) 180 MG 24 hr capsule Take 1 capsule (180 mg total) by mouth daily. 10/12/11 03/17/22 Yes Lesle Chris Black, NP  furosemide (LASIX) 40 MG tablet Take 1 tablet (40 mg total) by mouth daily. 03/18/12  Yes Corky Crafts, MD  guaiFENesin [facility]) 100 MG/5ML liquid Take 200 mg by mouth every 6 (six) hours as needed. For cough   Yes Historical Provider, MD  insulin glargine (LANTUS) 100 UNIT/ML injection Inject 20 Units into the skin at bedtime.   Yes Historical Provider, MD  insulin lispro (HUMALOG) 100 UNIT/ML injection Inject 5 Units into the skin as needed for high blood sugar (As needed for FSBS over 350).   Yes Historical Provider, MD  isosorbide mononitrate (IMDUR) 30 MG 24 hr tablet Take 1 tablet (30 mg total) by mouth daily. 03/17/12  Yes Corky Crafts, MD  levothyroxine (SYNTHROID, LEVOTHROID) 112 MCG tablet Take 112 mcg by mouth daily.   Yes Historical Provider, MD  loperamide (IMODIUM) 2 MG capsule Take 2 mg by mouth as needed for diarrhea or loose stools.   Yes Historical Provider, MD  magnesium hydroxide (MILK OF MAGNESIA) 400 MG/5ML suspension Take 30 mLs by mouth at bedtime as needed for constipation.   Yes Historical Provider, MD  metoprolol (LOPRESSOR) 50 MG tablet Take 1 tablet (50 mg total) by mouth 2 (two) times daily. 10/12/11 03/17/22 Yes Lesle Chris Black, NP  sodium chloride (MURO 128) 5 % ophthalmic solution Place 1 drop into the left eye 4 (four) times daily.   Yes Historical Provider, MD  Tamsulosin HCl (FLOMAX) 0.4 MG CAPS Take 0.4 mg by mouth daily.   Yes Historical Provider, MD  Vitamin D, Ergocalciferol, (DRISDOL) 50000 UNITS CAPS Take 50,000 Units by mouth.   Yes Historical Provider, MD  acetaminophen  (TYLENOL) 500 MG tablet Take 500 mg by mouth every 4 (four) hours as needed. Fever/pain    Historical Provider, MD  LORazepam (ATIVAN) 0.5 MG tablet Take 0.5 mg by mouth at bedtime as needed. Sleeping    Historical Provider, MD  nitroGLYCERIN (NITROSTAT) 0.4 MG SL tablet Place 1 tablet (0.4 mg total) under the tongue every 5 (five) minutes x 3 doses as needed for chest pain. 03/17/12   Corky Crafts, MD  sodium chloride (OCEAN) 0.65 % nasal spray Place 1 spray into the nose as needed for congestion. 09/20/12   Leroy Sea, MD   Physical Exam: Filed Vitals:   12/17/12 1129 12/17/12 1335 12/17/12 1500 12/17/12 1826  BP: 151/73  96/43   Pulse: 102  94   Temp: 100.1 F (37.8 C) 102.4 F (39.1 C) 100.7 F (38.2 C) 98.7 F (37.1 C)  TempSrc: Oral Rectal Oral   Resp: 15     Height:    5\' 10"  (1.778 m)  Weight:   86.4 kg (190 lb 7.6 oz)   SpO2: 97%       Physical Exam  Constitutional: Appears well-developed and well-nourished. No distress.  HENT: Normocephalic. Dry oral mucosa Eyes: Conjunctivae and EOM are normal. PERRLA, no scleral icterus.  Neck: Normal ROM. Neck supple. No JVD. No tracheal deviation. CVS: irregular rhythm, rate controlled, S1/S2 appreciated Pulmonary: Effort and breath sounds normal, no stridor, rhonchi, wheezes, rales.  Abdominal: Soft. BS +,  no distension, tenderness, rebound or guarding.  Musculoskeletal: Normal range of motion. Right lower extremity swelling and redness Lymphadenopathy: No lymphadenopathy noted, cervical, inguinal. Neuro: Alert. No focal neurologic deficits  Skin: right lower extremity swelling and erythema extending to the knee   Labs on Admission:  Basic Metabolic Panel:  Recent Labs Lab 12/17/12 1215 12/17/12 1535  NA 135 134*  K 4.7 4.1  CL 96 96  CO2 26 27  GLUCOSE 261* 273*  BUN 32* 33*  CREATININE 1.59* 1.68*  CALCIUM 9.7 9.2  MG  --  2.1  PHOS  --  3.1   Liver Function Tests: No results found for this  basename: AST, ALT, ALKPHOS, BILITOT, PROT, ALBUMIN,  in the last 168 hours No results found for this basename: LIPASE, AMYLASE,  in the last 168 hours No results found for this basename: AMMONIA,  in the last 168 hours CBC:  Recent Labs Lab 12/17/12 1215  WBC 29.2*  NEUTROABS 24.2*  HGB 13.9  HCT 41.5  MCV 90.2  PLT 206   Cardiac Enzymes: No results found for this basename: CKTOTAL, CKMB, CKMBINDEX, TROPONINI,  in the last 168 hours BNP: No components found with this basename: POCBNP,  CBG: No results found for this basename: GLUCAP,  in the last 168 hours  Radiological Exams on Admission: No results found.  Code Status: DNR/DNI Family Communication: Pt at bedside Disposition Plan: Admit for further evaluation  Manson Passey, MD  [facility] Pager [phone]  If 7PM-7AM, please contact night-coverage [email or web address] Password [facility] 12/17/2012, 7:13 PM

## 2012-12-17 NOTE — ED Notes (Signed)
Per EMS, pt from the [facility], pt c/o right leg pain that started yesterday. Pt has hx of melanoma to the same leg, EMS reports redness and swelling to right leg as well as a pressure sore to the right foot.

## 2012-12-17 NOTE — Progress Notes (Addendum)
ANTIBIOTIC CONSULT NOTE - INITIAL  Pharmacy Consult for Vancomycin/Zosyn Indication: Cellulitis, Sepsis  Allergies  Allergen Reactions  . Accupril (Quinapril Hcl) Other (See Comments)    unknown  . Naproxen Rash    Patient Measurements: 7/23 Total body weight = 86.4kg , height = 70"  Vital Signs: Temp: 102.4 F (39.1 C) (07/23 1335) Temp src: Rectal (07/23 1335) BP: 151/73 mmHg (07/23 1129) Pulse Rate: 102 (07/23 1129) Intake/Output from previous day:   Intake/Output from this shift:    Labs:  Recent Labs  12/17/12 1215  WBC 29.2*  HGB 13.9  PLT 206  CREATININE 1.59*   The CrCl is unknown because both a height and weight (above a minimum accepted value) are required for this calculation. No results found for this basename: VANCOTROUGH, VANCOPEAK, VANCORANDOM, GENTTROUGH, GENTPEAK, GENTRANDOM, TOBRATROUGH, TOBRAPEAK, TOBRARND, AMIKACINPEAK, AMIKACINTROU, AMIKACIN,  in the last 72 hours   Microbiology: No results found for this or any previous visit (from the past 720 hour(s)).  Medical History: Past Medical History  Diagnosis Date  . Prostate cancer   . Hypothyroidism   . Hyperglycemia   . DMII (diabetes mellitus, type 2)   . Prostate cancer, primary, with metastasis from prostate to other site 12/06/2011  . Prostate cancer metastatic to multiple sites   . A-fib   . CKD (chronic kidney disease)     Assessment: 75 yoM admitted with recurrent cellulitis in RLE and possible sepsis.  PMHx: prostate CA, T2DM, Afib, CKD, melanoma to R foot s/p localized chemotherapy.   Pharmacy consulted to dose Vancomycin and zosyn.  Pt has already received 1 dose vancomycin/zosyn in ED.   7/23 >> Vancomycin >> 7/23 >> Zosyn >>  Renal: Hx CKD.  SCr 1.59, appears near baseline, CrCl ~ 32, N = 27 WBC: Elevated @ 29.2 Tm24h: 102.4  Cultures:  7/23 blood cultures collected, urine cultures ordered  Goal of Therapy:  Vancomycin trough level 10-15 mcg/ml  Plan:  1.   Vancomycin 750g IV q 24 hours, Zosyn 3.375g IV q8h (infuse over 4 hours)  2.  F/u renal fxn, Vancomycin trough at steady state, Tm, cultures, WBC, clinical course.    Haynes Hoehn, PharmD 12/17/2012 3:19 PM  Pager: [phone]

## 2012-12-17 NOTE — ED Notes (Signed)
Bed:WA05<BR> Expected date:<BR> Expected time:<BR> Means of arrival:<BR> Comments:<BR> EMS

## 2012-12-18 ENCOUNTER — Inpatient Hospital Stay (HOSPITAL_COMMUNITY): Payer: Medicare Other

## 2012-12-18 LAB — COMPREHENSIVE METABOLIC PANEL
ALT: 6 U/L (ref 0–53)
AST: 9 U/L (ref 0–37)
CO2: 30 mEq/L (ref 19–32)
Chloride: 96 mEq/L (ref 96–112)
Creatinine, Ser: 1.9 mg/dL — ABNORMAL HIGH (ref 0.50–1.35)
GFR calc Af Amer: 32 mL/min — ABNORMAL LOW (ref >90)
GFR calc non Af Amer: 28 mL/min — ABNORMAL LOW (ref >90)
Glucose, Bld: 192 mg/dL — ABNORMAL HIGH (ref 70–99)
Total Bilirubin: 0.9 mg/dL (ref 0.3–1.2)

## 2012-12-18 LAB — URINE CULTURE

## 2012-12-18 LAB — CBC
MCH: 29.7 pg (ref 26.0–34.0)
MCHC: 32.9 g/dL (ref 30.0–36.0)
Platelets: 155 10*3/uL (ref 150–400)
RBC: 3.77 MIL/uL — ABNORMAL LOW (ref 4.22–5.81)

## 2012-12-18 LAB — GLUCOSE, CAPILLARY: Glucose-Capillary: 202 mg/dL — ABNORMAL HIGH (ref 70–99)

## 2012-12-18 MED ORDER — SODIUM CHLORIDE 0.9 % IJ SOLN
10.0000 mL | INTRAMUSCULAR | Status: DC | PRN
  Administered 2012-12-19: 10 mL

## 2012-12-18 MED ORDER — GLUCERNA SHAKE PO LIQD
237.0000 mL | Freq: Two times a day (BID) | ORAL | Status: DC
  Administered 2012-12-18 – 2012-12-20 (×6): 237 mL via ORAL
  Filled 2012-12-18 (×6): qty 237

## 2012-12-18 NOTE — Progress Notes (Signed)
[facility] PROGRESS NOTE  [name] ZOX:[id number] DOB: [birth year] DOA: 12/17/2012 PCP: Sissy Hoff, MD  Assessment/Plan:  1-Cellulitis of Right lower extremity  - continue vancomycin and zosyn day 1.  - wound care consulted. - LE venous doppler with no obvious evidence of DVT   2-SIRS: Patient with leukocytosis, tachycardia, fever, mild hypotension. Likely secondary to Right LE cellulitis.  -Continue with broad spectrum antibiotics. -Lactic acid normal at 2.2.  -Blood culture pending.   3-Hypothyroidism  - continue levothyroxine   HTN (hypertension)  - Continue to hold  Cardizem,  Imdur due to low BP, 96/43. Resume metoprolol.   DM (diabetes mellitus)  -continue Lantus 20 units at bedtime  -sliding scale inusulin   BPH (benign prostatic hyperplasia)  - continue flomax   Acute on chronic renal failure stage 3:  -Last cr per our records at 1.5.  -Worsening renal function in setting of diuretic, hypotension, infection.  - lasix on hold. -Increase IV fluids.   Leukocytosis, unspecified  - secondary to cellulitis  - continue vancomycin and zosyn.  -WBC trending down to 28 from 29.    Code Status: DNR/DNI Family Communication:  Disposition Plan: Remain inpatient. PT, OT consult.    Consultants:  None  Procedures: Doppler LE : Right lower extremity venous duplex completed.  The study was technically difficult due to edema and patient positioning.  There is no obvious evidence of right lower extremity deep vein thrombosis, however due to technical limitations this study is inconclusive.    Antibiotics:  Vancomycin 7-23  Zosyn 7-23  HPI/Subjective: Feeling better this morning. He wants to go to the restroom.    Objective: Filed Vitals:   12/17/12 1826 12/17/12 2051 12/17/12 2120 12/18/12 0507  BP:  91/30 100/56 123/47  Pulse:  72  75  Temp: 98.7 F (37.1 C) 98.9 F (37.2 C)  98.2 F (36.8 C)  TempSrc:  Oral  Oral  Resp:  16  16   Height: 5\' 10"  (1.778 m)     Weight:    85.6 kg (188 lb 11.4 oz)  SpO2:  98%  92%    Intake/Output Summary (Last 24 hours) at 12/18/12 0759 Last data filed at 12/18/12 0751  Gross per 24 hour  Intake 1182.5 ml  Output    850 ml  Net  332.5 ml   Filed Weights   12/17/12 1500 12/18/12 0507  Weight: 86.4 kg (190 lb 7.6 oz) 85.6 kg (188 lb 11.4 oz)    Exam:   General:  No acute distress, awake, oriented to person, place and situation.   Cardiovascular: S 1, S 2 RRR, systolic murmur.   Respiratory: CTA  Abdomen: BS present, soft, NT  Musculoskeletal: right lower extremities with chronic scar, deformity, superimposed redness up to the thigh.   Data Reviewed: Basic Metabolic Panel:  Recent Labs Lab 12/17/12 1215 12/17/12 1535 12/18/12 0508  NA 135 134* 134*  K 4.7 4.1 3.8  CL 96 96 96  CO2 26 27 30   GLUCOSE 261* 273* 192*  BUN 32* 33* 35*  CREATININE 1.59* 1.68* 1.90*  CALCIUM 9.7 9.2 9.1  MG  --  2.1  --   PHOS  --  3.1  --    Liver Function Tests:  Recent Labs Lab 12/18/12 0508  AST 9  ALT 6  ALKPHOS 68  BILITOT 0.9  PROT 5.4*  ALBUMIN 2.7*   No results found for this basename: LIPASE, AMYLASE,  in the last 168 hours No results found  for this basename: AMMONIA,  in the last 168 hours CBC:  Recent Labs Lab 12/17/12 1215 12/18/12 0508  WBC 29.2* 28.5*  NEUTROABS 24.2*  --   HGB 13.9 11.2*  HCT 41.5 34.0*  MCV 90.2 90.2  PLT 206 155   Cardiac Enzymes: No results found for this basename: CKTOTAL, CKMB, CKMBINDEX, TROPONINI,  in the last 168 hours BNP (last 3 results)  Recent Labs  03/14/12 2157  PROBNP 13433.0*   CBG:  Recent Labs Lab 12/17/12 1705 12/18/12 0718  GLUCAP 237* 144*    Recent Results (from the past 240 hour(s))  MRSA PCR SCREENING     Status: None   Collection Time    12/17/12  8:08 PM      Result Value Range Status   MRSA by PCR NEGATIVE  NEGATIVE Final   Comment:            The GeneXpert MRSA Assay (FDA      approved for NASAL specimens     only), is one component of a     comprehensive MRSA colonization     surveillance program. It is not     intended to diagnose MRSA     infection nor to guide or     monitor treatment for     MRSA infections.     Studies: No results found.  Scheduled Meds: . insulin aspart  0-15 Units Subcutaneous TID WC  . insulin glargine  20 Units Subcutaneous QHS  . levothyroxine  112 mcg Oral QAC breakfast  . multivitamin-lutein  1 capsule Oral Daily  . piperacillin-tazobactam (ZOSYN)  IV  3.375 g Intravenous Q8H  . prednisoLONE acetate  1 drop Both Eyes Daily  . sodium chloride  1 drop Left Eye QID  . tamsulosin  0.4 mg Oral Daily  . vancomycin  750 mg Intravenous Q24H  . [START ON 12/23/2012] Vitamin D (Ergocalciferol)  50,000 Units Oral Q7 days   Continuous Infusions: . sodium chloride 50 mL/hr at 12/18/12 4259    Principal Problem:   Cellulitis of right lower extremity Active Problems:   Hypothyroidism   HTN (hypertension)   DM (diabetes mellitus)   BPH (benign prostatic hyperplasia)   Acute renal failure   Leukocytosis, unspecified    Time spent: 35 minutes.     Sanaz Scarlett  [facility] Pager [phone]. If 7PM-7AM, please contact night-coverage at [email or web address], password [facility] 12/18/2012, 7:59 AM  LOS: 1 day

## 2012-12-18 NOTE — Consult Note (Signed)
WOC consult Note Reason for Consult:Cellulitis right heel. Patient with 40 year history of skin graft to right heel (autologus). Area is 6cm x 5cm and soft, protuberant.  There is a 1.5cm x .5cm callous at the 5 o'clock position (lateral heel)  Wound type:inflammatory vs infection vs pressure Pressure Ulcer POA: Yes Measurement:Se above Wound bed:Not open Drainage (amount, consistency, odor) None Periwound:intact.  Previously grafted tissue Dressing procedure/placement/frequency:I have advised the nursing staff to moisturize both heels daily and have provided a pressure redistribution boot for his use while in bed that can correct for lateral rotation.  Patient should be referred to pedorthist/podiatrist for evaluation of therapeutic insole that might correct the lateral rotation and thereby prevent possible skin breakdown.   I have nothing further to add at this point.  Thank you for allowing me to see this delightful gentleman. I will not follow.  Please re-consult if needed. Ladona Mow, MSN, RN, [facility], CWOCN [phone])

## 2012-12-18 NOTE — Evaluation (Signed)
Physical Therapy Evaluation Patient Details Name: [name] MRN: [id number] DOB: [birth year] Today's Date: 12/18/2012 Time: 4098-1191 PT Time Calculation (min): 33 min  PT Assessment / Plan / Recommendation History of Present Illness  79 year old male with past medical history of hypertension, hypothyroidism, BPH, DM who presented to [facility] ED with worsening pain in his right leg for past 1-2 days prior to this admission. Patient reported associated subjective fever and chills. No complaints of falls. No chest pain, no shortness of breath, no palpitations. No abdominal pain, no nausea and no vomiting.  Clinical Impression  On eval, pt required Min assist for mobility-able to ambulate ~200 feet with RW. Pt normally does not require an assistive device and is modified independent with mobility at baseline. Demonstrates general weakness/deconditioning. May need SNF if facility is unable to provide current level of care.     PT Assessment  Patient needs continued PT services    Follow Up Recommendations  Home health PT at ALF IF facility is able to provide current level of care. Otherwise, pt will need SNF.      Does the patient have the potential to tolerate intense rehabilitation      Barriers to Discharge        Equipment Recommendations  None recommended by PT    Recommendations for Other Services OT consult   Frequency Min 3X/week    Precautions / Restrictions Precautions Precautions: Fall Restrictions Weight Bearing Restrictions: No   Pertinent Vitals/Pain R LE-5/10       Mobility  Bed Mobility Bed Mobility: Supine to Sit;Sit to Supine Supine to Sit: HOB elevated;6: Modified independent (Device/Increase time) Sit to Supine: HOB elevated;6: Modified independent (Device/Increase time) Transfers Transfers: Sit to Stand;Stand to Sit Sit to Stand: 4: Min assist;From bed;From elevated surface Stand to Sit: 4: Min guard;To bed;To elevated surface Details for Transfer  Assistance: Multiple attemtps for pt to achieve standing without assistance-pt unable-fell back onto bed each time. Assist to rise, stabilize. VCS safety, technique, hand placement Ambulation/Gait Ambulation/Gait Assistance: 4: Min guard Ambulation Distance (Feet): 200 Feet Assistive device: Rolling walker Ambulation/Gait Assistance Details: VCs safety. good gait speed.  Gait Pattern: Step-through pattern;Decreased stride length    Exercises     PT Diagnosis: Generalized weakness;Difficulty walking  PT Problem List: Decreased strength;Decreased activity tolerance;Decreased mobility PT Treatment Interventions: DME instruction;Gait training;Functional mobility training;Therapeutic activities;Therapeutic exercise;Patient/family education     PT Goals(Current goals can be found in the care plan section) Acute Rehab PT Goals Patient Stated Goal: To get stronger PT Goal Formulation: With patient Time For Goal Achievement: 01/01/13 Potential to Achieve Goals: Good  Visit Information  Last PT Received On: 12/18/12 Assistance Needed: +1 History of Present Illness: 79 year old male with past medical history of hypertension, hypothyroidism, BPH, DM who presented to [facility] ED with worsening pain in his right leg for past 1-2 days prior to this admission. Patient reported associated subjective fever and chills. No complaints of falls. No chest pain, no shortness of breath, no palpitations. No abdominal pain, no nausea and no vomiting.       Prior Functioning  Home Living Family/patient expects to be discharged to:: Assisted living Home Equipment: Gilmer Mor - single point;Walker - 2 wheels Prior Function Level of Independence: Independent Communication Communication: HOH    Cognition  Cognition Arousal/Alertness: Awake/alert Behavior During Therapy: WFL for tasks assessed/performed Overall Cognitive Status: Within Functional Limits for tasks assessed    Extremity/Trunk Assessment Upper Extremity  Assessment Upper Extremity Assessment: Generalized  weakness Lower Extremity Assessment Lower Extremity Assessment: Generalized weakness Cervical / Trunk Assessment Cervical / Trunk Assessment: Kyphotic   Balance    End of Session PT - End of Session Equipment Utilized During Treatment: Gait belt Activity Tolerance: Patient tolerated treatment well Patient left: in bed;with call bell/phone within reach;with bed alarm set  GP     Rebeca Alert, MPT Pager: [phone]

## 2012-12-18 NOTE — Progress Notes (Signed)
piccPeripherally Inserted Central Catheter/Midline Placement  The IV Nurse has discussed with the patient and/or persons authorized to consent for the patient, the purpose of this procedure and the potential benefits and risks involved with this procedure.  The benefits include less needle sticks, lab draws from the catheter and patient may be discharged home with the catheter.  Risks include, but not limited to, infection, bleeding, blood clot (thrombus formation), and puncture of an artery; nerve damage and irregular heat beat.  Alternatives to this procedure were also discussed.  PICC/Midline Placement Documentation  PICC / Midline Single Lumen 12/18/12 PICC Right Basilic (Active)       [name] 12/18/2012, 9:41 PM

## 2012-12-19 LAB — CBC
MCV: 90.9 fL (ref 78.0–100.0)
Platelets: 156 10*3/uL (ref 150–400)
RBC: 3.83 MIL/uL — ABNORMAL LOW (ref 4.22–5.81)
RDW: 14.5 % (ref 11.5–15.5)
WBC: 17.5 10*3/uL — ABNORMAL HIGH (ref 4.0–10.5)

## 2012-12-19 LAB — BASIC METABOLIC PANEL
CO2: 29 mEq/L (ref 19–32)
Calcium: 9.1 mg/dL (ref 8.4–10.5)
Chloride: 101 mEq/L (ref 96–112)
Creatinine, Ser: 1.62 mg/dL — ABNORMAL HIGH (ref 0.50–1.35)
GFR calc Af Amer: 39 mL/min — ABNORMAL LOW (ref >90)
Sodium: 136 mEq/L (ref 135–145)

## 2012-12-19 LAB — GLUCOSE, CAPILLARY
Glucose-Capillary: 152 mg/dL — ABNORMAL HIGH (ref 70–99)
Glucose-Capillary: 123 mg/dL — ABNORMAL HIGH (ref 70–99)

## 2012-12-19 MED ORDER — METOPROLOL TARTRATE 50 MG PO TABS
50.0000 mg | ORAL_TABLET | Freq: Two times a day (BID) | ORAL | Status: DC
  Administered 2012-12-19 – 2012-12-20 (×3): 50 mg via ORAL
  Filled 2012-12-19 (×4): qty 1

## 2012-12-19 MED ORDER — CIPROFLOXACIN HCL 500 MG PO TABS: 250.0000 mg | ORAL_TABLET | Freq: Two times a day (BID) | ORAL | Status: DC

## 2012-12-19 MED ORDER — POLYETHYLENE GLYCOL 3350 17 G PO PACK
17.0000 g | PACK | Freq: Every day | ORAL | Status: DC
  Administered 2012-12-19 – 2012-12-20 (×2): 17 g via ORAL
  Filled 2012-12-19 (×2): qty 1

## 2012-12-19 MED ORDER — HYDROCODONE-ACETAMINOPHEN 5-325 MG PO TABS: 1.0000 | ORAL_TABLET | ORAL | Status: DC | PRN

## 2012-12-19 MED ORDER — GLUCERNA SHAKE PO LIQD: 237.0000 mL | Freq: Two times a day (BID) | ORAL | Status: DC

## 2012-12-19 MED ORDER — DOXYCYCLINE HYCLATE 50 MG PO CAPS: 100.0000 mg | ORAL_CAPSULE | Freq: Two times a day (BID) | ORAL | Status: DC

## 2012-12-19 MED ORDER — DILTIAZEM HCL ER COATED BEADS 180 MG PO CP24
180.0000 mg | ORAL_CAPSULE | Freq: Every day | ORAL | Status: DC
  Administered 2012-12-19 – 2012-12-20 (×2): 180 mg via ORAL
  Filled 2012-12-19 (×2): qty 1

## 2012-12-19 NOTE — Progress Notes (Signed)
Clinical Social Work Department BRIEF PSYCHOSOCIAL ASSESSMENT 12/19/2012  Patient:  [name], [name]     Account Number:  [id number]     Admit date:  12/17/2012  Clinical Social Worker:  Hattie Perch  Date/Time:  12/19/2012 12:00 M  Referred by:  Physician  Date Referred:  12/19/2012 Referred for  SNF Placement   Other Referral:   Interview type:  Family Other interview type:    PSYCHOSOCIAL DATA Living Status:  FACILITY Admitted from facility:  OTHER Level of care:  Assisted Living Primary support name:  [name] Primary support relationship to patient:  CHILD, ADULT Degree of support available:   good    CURRENT CONCERNS Current Concerns  Post-Acute Placement   Other Concerns:    SOCIAL WORK ASSESSMENT / PLAN CSW met with patient. patient is alert and semi oriented. CSW called patient's daughter, gail, who is POA. Discussed that patient is from [facility] but will need SNF placement due to weakness and IV antibiotics. She is in agreement and is really hopeful that [facility] will have a bed as she was a nurse there for 14 years. She believes that patient may need to transition to long term care as she is concerned that [facility] can no longer meet his needs at the level of care he needs.   Assessment/plan status:   Other assessment/ plan:   Information/referral to community resources:    PATIENT'S/FAMILY'S RESPONSE TO PLAN OF CARE: family is hopeful and excited at the prospect that [facility] is able to take the patient. Family is frustrated that [facility] was unable to manage patient medically as they hoped they would be able to.

## 2012-12-19 NOTE — Clinical Social Work Psychosocial (Unsigned)
     Clinical Social Work Department BRIEF PSYCHOSOCIAL ASSESSMENT 12/19/2012  Patient:  [name], [name]     Account Number:  [id number]     Admit date:  12/17/2012  Clinical Social Worker:  Hattie Perch  Date/Time:  12/19/2012 12:00 M  Referred by:  Physician  Date Referred:  12/19/2012 Referred for  SNF Placement   Other Referral:   Interview type:  Family Other interview type:    PSYCHOSOCIAL DATA Living Status:  FACILITY Admitted from facility:  OTHER Level of care:  Assisted Living Primary support name:  [name] Primary support relationship to patient:  CHILD, ADULT Degree of support available:   good    CURRENT CONCERNS Current Concerns  Post-Acute Placement   Other Concerns:    SOCIAL WORK ASSESSMENT / PLAN CSW met with patient. patient is alert and semi oriented. CSW called patient's daughter, [name], who is POA. Discussed that patient is from [facility] but will need SNF placement due to weakness and IV antibiotics. She is in agreement and is really hopeful that [facility] will have a bed as she was a nurse there for 14 years. She believes that patient may need to transition to long term care as she is concerned that [facility] can no longer meet his needs at the level of care he needs.   Assessment/plan status:   Other assessment/ plan:   Information/referral to community resources:    PATIENTS/FAMILYS RESPONSE TO PLAN OF CARE: family is hopeful and excited at the prospect that [facility] is able to take the patient. Family is frustrated that [facility] was unable to manage patient medically as they hoped they would be able to.

## 2012-12-19 NOTE — Evaluation (Signed)
Occupational Therapy Evaluation Patient Details Name: [name] MRN: [id number] DOB: 07-06-1913 Today's Date: 12/19/2012 Time: 1027-2536 OT Time Calculation (min): 19 min  OT Assessment / Plan / Recommendation History of present illness 79 year old male with past medical history of hypertension, hypothyroidism, BPH, DM who presented to [facility] ED with worsening pain in his right leg for past 1-2 days prior to this admission. Patient reported associated subjective fever and chills. No complaints of falls. No chest pain, no shortness of breath, no palpitations. No abdominal pain, no nausea and no vomiting.   Clinical Impression   Pt is admitted with cellulitis of RLE.  He is usually independent with adls at ALF [facility]).  He will benefit from skilled OT to increase indepence with adls.  goasl in acute are for supervision level.      OT Assessment  Patient needs continued OT Services    Follow Up Recommendations  SNF (unless ALF can provide needed assistance)    Barriers to Discharge      Equipment Recommendations       Recommendations for Other Services    Frequency  Min 2X/week    Precautions / Restrictions Precautions Precautions: Fall Restrictions Weight Bearing Restrictions: No   Pertinent Vitals/Pain "it hurts"  Not rated, RLE.  RN brought pain medication and repositioned    ADL  Grooming: Set up Where Assessed - Grooming: Supported sitting Upper Body Bathing: Set up Where Assessed - Upper Body Bathing: Supported sitting Lower Body Bathing: Minimal assistance Where Assessed - Lower Body Bathing: Supported sit to stand Upper Body Dressing: Minimal assistance (iv) Where Assessed - Upper Body Dressing: Supported sitting Lower Body Dressing: Moderate assistance Where Assessed - Lower Body Dressing: Supported sit to Pharmacist, hospital: Minimal assistance Statistician Method: Surveyor, minerals:  (bed to chair) Toileting - Clothing  Manipulation and Hygiene: Minimal assistance Where Assessed - Toileting Clothing Manipulation and Hygiene: Sit to stand from 3-in-1 or toilet Equipment Used:  (used chair arm for support for transfer) Transfers/Ambulation Related to ADLs: Pt declined walker--used chair arm for spt.   ADL Comments: Simulated all adls as pt did not want to perform.  He is normally able to don shoes and socks but it is difficult.  Could reach to bil midfoot area today    OT Diagnosis: Generalized weakness  OT Problem List: Decreased strength;Decreased activity tolerance;Pain;Decreased knowledge of use of DME or AE;Impaired balance (sitting and/or standing) OT Treatment Interventions: Self-care/ADL training;DME and/or AE instruction;Patient/family education;Balance training   OT Goals(Current goals can be found in the care plan section) Acute Rehab OT Goals Patient Stated Goal: to get stronger OT Goal Formulation: With patient Time For Goal Achievement: 01/02/13 Potential to Achieve Goals: Good ADL Goals Pt Will Perform Lower Body Bathing: with supervision;sit to/from stand Pt Will Perform Lower Body Dressing: with supervision;sit to/from stand (ae prn) Pt Will Transfer to Toilet: with supervision;ambulating;bedside commode Pt Will Perform Toileting - Clothing Manipulation and hygiene: with supervision;sit to/from stand Additional ADL Goal #1: Pt will gather clothes at supervision level with RW  Visit Information  Last OT Received On: 12/19/12 Assistance Needed: +1 History of Present Illness: 79 year old male with past medical history of hypertension, hypothyroidism, BPH, DM who presented to [facility] ED with worsening pain in his right leg for past 1-2 days prior to this admission. Patient reported associated subjective fever and chills. No complaints of falls. No chest pain, no shortness of breath, no palpitations. No abdominal pain,  no nausea and no vomiting.       Prior Functioning     Home  Living Family/patient expects to be discharged to:: Assisted living Home Equipment: Gilmer Mor - single point;Walker - 2 wheels Prior Function Level of Independence: Independent Communication Communication: HOH         Vision/Perception     Cognition  Cognition Arousal/Alertness: Awake/alert Behavior During Therapy: WFL for tasks assessed/performed Overall Cognitive Status: Within Functional Limits for tasks assessed    Extremity/Trunk Assessment Upper Extremity Assessment Upper Extremity Assessment: Generalized weakness Cervical / Trunk Assessment Cervical / Trunk Assessment: Kyphotic     Mobility Bed Mobility Bed Mobility: Supine to Sit Supine to Sit: HOB elevated;6: Modified independent (Device/Increase time) Transfers Sit to Stand: 4: Min assist;From bed;From elevated surface Stand to Sit: 4: Min guard;To chair/3-in-1;With armrests Details for Transfer Assistance: cues to scoot forward      Exercise     Balance     End of Session OT - End of Session Activity Tolerance: Patient tolerated treatment well Patient left: in chair;with call bell/phone within reach  GO     [facility] 12/19/2012, 10:09 AM Marica Otter, OTR/L [phone] 12/19/2012

## 2012-12-19 NOTE — Progress Notes (Signed)
[facility] PROGRESS NOTE  [name] ZOX:[id number] DOB: 27-Jul-1913 DOA: 12/17/2012 PCP: Sissy Hoff, MD  Assessment/Plan:  1-Cellulitis of Right lower extremity  - Continue vancomycin and zosyn day 2.  - wound care consult appreciated.  - LE venous doppler with no obvious evidence of DVT   2-SIRS: Patient with leukocytosis, tachycardia, fever, mild hypotension. Likely secondary to Right LE cellulitis.  -Continue with broad spectrum antibiotics. -Lactic acid normal at 2.2.  -Blood culture no growth to date.    3-Hypothyroidism  - continue levothyroxine   HTN (hypertension)  -Resume metoprolol and Cardizem.    DM (diabetes mellitus)  -continue Lantus 20 units at bedtime  -sliding scale inusulin   BPH (benign prostatic hyperplasia)  - continue flomax   Acute on chronic renal failure stage 3:  -Last cr per our records at 1.5.  -Worsening renal function in setting of diuretic, hypotension, infection.  - lasix on hold. -Continue with IV fluids. Cr decrease to 1.6 from 1.9.   Leukocytosis, unspecified  - secondary to cellulitis  - continue vancomycin and zosyn.  -WBC trending down to 28 from 29.    Code Status: DNR/DNI Family Communication:  Disposition Plan: if significant improvement of LE cellulitis will plan to discharge 7-26. SW consulted.    Consultants:  None  Procedures: Doppler LE : Right lower extremity venous duplex completed.  The study was technically difficult due to edema and patient positioning.  There is no obvious evidence of right lower extremity deep vein thrombosis, however due to technical limitations this study is inconclusive.    Antibiotics:  Vancomycin 7-23  Zosyn 7-23  HPI/Subjective: Had a difficult night. He is complaining of right leg pain.    Objective: Filed Vitals:   12/18/12 0507 12/18/12 1300 12/18/12 2216 12/19/12 0622  BP: 123/47 119/55 131/65 140/73  Pulse: 75 70 93 100  Temp: 98.2 F (36.8 C)  98.3 F (36.8 C) 98.9 F (37.2 C) 98.1 F (36.7 C)  TempSrc: Oral Oral Oral Oral  Resp: 16 14 20 24   Height:      Weight: 85.6 kg (188 lb 11.4 oz)   86.592 kg (190 lb 14.4 oz)  SpO2: 92% 95% 95% 100%    Intake/Output Summary (Last 24 hours) at 12/19/12 0803 Last data filed at 12/19/12 0442  Gross per 24 hour  Intake 2024.17 ml  Output   1275 ml  Net 749.17 ml   Filed Weights   12/17/12 1500 12/18/12 0507 12/19/12 0622  Weight: 86.4 kg (190 lb 7.6 oz) 85.6 kg (188 lb 11.4 oz) 86.592 kg (190 lb 14.4 oz)    Exam:   General:  No acute distress, awake, oriented to person, place and situation.   Cardiovascular: S 1, S 2 RRR, systolic murmur.   Respiratory: CTA  Abdomen: BS present, soft, NT  Musculoskeletal: right lower extremities with chronic scar, deformity, superimposed redness up to the thigh. Redness decreasing.   Data Reviewed: Basic Metabolic Panel:  Recent Labs Lab 12/17/12 1215 12/17/12 1535 12/18/12 0508 12/19/12 0433  NA 135 134* 134* 136  K 4.7 4.1 3.8 3.8  CL 96 96 96 101  CO2 26 27 30 29   GLUCOSE 261* 273* 192* 146*  BUN 32* 33* 35* 29*  CREATININE 1.59* 1.68* 1.90* 1.62*  CALCIUM 9.7 9.2 9.1 9.1  MG  --  2.1  --   --   PHOS  --  3.1  --   --    Liver Function Tests:  Recent Labs  Lab 12/18/12 0508  AST 9  ALT 6  ALKPHOS 68  BILITOT 0.9  PROT 5.4*  ALBUMIN 2.7*   No results found for this basename: LIPASE, AMYLASE,  in the last 168 hours No results found for this basename: AMMONIA,  in the last 168 hours CBC:  Recent Labs Lab 12/17/12 1215 12/18/12 0508 12/19/12 0433  WBC 29.2* 28.5* 17.5*  NEUTROABS 24.2*  --   --   HGB 13.9 11.2* 11.4*  HCT 41.5 34.0* 34.8*  MCV 90.2 90.2 90.9  PLT 206 155 156   Cardiac Enzymes: No results found for this basename: CKTOTAL, CKMB, CKMBINDEX, TROPONINI,  in the last 168 hours BNP (last 3 results)  Recent Labs  03/14/12 2157  PROBNP 13433.0*   CBG:  Recent Labs Lab 12/17/12 1705  12/18/12 0718 12/18/12 1239 12/18/12 1722 12/18/12 2220  GLUCAP 237* 144* 181* 202* 152*    Recent Results (from the past 240 hour(s))  CULTURE, BLOOD (ROUTINE X 2)     Status: None   Collection Time    12/17/12  1:00 PM      Result Value Range Status   Specimen Description BLOOD LEFT FOREARM   Final   Special Requests BOTTLES DRAWN AEROBIC AND ANAEROBIC   Final   Culture  Setup Time 12/17/2012 17:23   Final   Culture     Final   Value:        BLOOD CULTURE RECEIVED NO GROWTH TO DATE CULTURE WILL BE HELD FOR 5 DAYS BEFORE ISSUING A FINAL NEGATIVE REPORT   Report Status PENDING   Incomplete  CULTURE, BLOOD (ROUTINE X 2)     Status: None   Collection Time    12/17/12  1:05 PM      Result Value Range Status   Specimen Description BLOOD AC   Final   Special Requests BOTTLES DRAWN AEROBIC ONLY   Final   Culture  Setup Time 12/17/2012 17:23   Final   Culture     Final   Value:        BLOOD CULTURE RECEIVED NO GROWTH TO DATE CULTURE WILL BE HELD FOR 5 DAYS BEFORE ISSUING A FINAL NEGATIVE REPORT   Report Status PENDING   Incomplete  URINE CULTURE     Status: None   Collection Time    12/17/12  7:52 PM      Result Value Range Status   Specimen Description URINE, CLEAN CATCH   Final   Special Requests NONE   Final   Culture  Setup Time 12/18/2012 00:21   Final   Colony Count NO GROWTH   Final   Culture NO GROWTH   Final   Report Status 12/18/2012 FINAL   Final  MRSA PCR SCREENING     Status: None   Collection Time    12/17/12  8:08 PM      Result Value Range Status   MRSA by PCR NEGATIVE  NEGATIVE Final   Comment:            The GeneXpert MRSA Assay (FDA     approved for NASAL specimens     only), is one component of a     comprehensive MRSA colonization     surveillance program. It is not     intended to diagnose MRSA     infection nor to guide or     monitor treatment for     MRSA infections.     Studies: Dg Chest Port 1  View  12/18/2012   *RADIOLOGY REPORT*   Clinical Data: Right PICC placement.  PORTABLE CHEST - 1 VIEW  Comparison: 09/19/2012  Findings: A right PICC line has been placed with tip over the cavoatrial junction.  No pneumothorax.  Borderline heart size and pulmonary vascularity are likely normal due to shallow inspiration. There is a small left pleural effusion or pleural thickening with basilar atelectasis.  This is similar to previous study. Calcification of the aorta.  Degenerative changes in the shoulders.  IMPRESSION: Right PICC line tip over the cavoatrial junction.  No pneumothorax. Persistent fluid or thickened pleura on the left with atelectasis in the left lung base.   Original Report Authenticated By: Burman Nieves, M.D.    Scheduled Meds: . feeding supplement  237 mL Oral BID BM  . insulin aspart  0-15 Units Subcutaneous TID WC  . insulin glargine  20 Units Subcutaneous QHS  . levothyroxine  112 mcg Oral QAC breakfast  . multivitamin-lutein  1 capsule Oral Daily  . piperacillin-tazobactam (ZOSYN)  IV  3.375 g Intravenous Q8H  . prednisoLONE acetate  1 drop Both Eyes Daily  . sodium chloride  1 drop Left Eye QID  . tamsulosin  0.4 mg Oral Daily  . vancomycin  750 mg Intravenous Q24H  . [START ON 12/23/2012] Vitamin D (Ergocalciferol)  50,000 Units Oral Q7 days   Continuous Infusions: . sodium chloride 75 mL/hr at 12/18/12 2258    Principal Problem:   Cellulitis of right lower extremity Active Problems:   Hypothyroidism   HTN (hypertension)   DM (diabetes mellitus)   BPH (benign prostatic hyperplasia)   Acute renal failure   Leukocytosis, unspecified    Time spent: 35 minutes.     [name]  [facility] Pager [phone]. If 7PM-7AM, please contact night-coverage at [email or web address], password [facility] 12/19/2012, 8:03 AM  LOS: 2 days

## 2012-12-19 NOTE — Progress Notes (Signed)
Physical Therapy Treatment Patient Details Name: [name] MRN: [id number] DOB: [birth year] Today's Date: 12/19/2012 Time: 1036-1050 PT Time Calculation (min): 14 min  PT Assessment / Plan / Recommendation  History of Present Illness 79 year old male with past medical history of hypertension, hypothyroidism, BPH, DM who presented to [facility] ED with worsening pain in his right leg for past 1-2 days prior to this admission. Patient reported associated subjective fever and chills. No complaints of falls. No chest pain, no shortness of breath, no palpitations. No abdominal pain, no nausea and no vomiting.   Clinical Impression    PT Comments   Progressing with mobility. Pt c/o increased R LE pain this session. Will likely need ST rehab at SNF unless facility is able to provide current level of care.   Follow Up Recommendations  SNF (unless ALF is able to provide current level of care.)     Does the patient have the potential to tolerate intense rehabilitation     Barriers to Discharge        Equipment Recommendations  None recommended by PT    Recommendations for Other Services OT consult  Frequency Min 3X/week   Progress towards PT Goals Progress towards PT goals: Progressing toward goals  Plan Current plan remains appropriate    Precautions / Restrictions Precautions Precautions: Fall Restrictions Weight Bearing Restrictions: No   Pertinent Vitals/Pain R LE-unrated     Mobility  Bed Mobility Bed Mobility: Not assessed Supine to Sit: HOB elevated;6: Modified independent (Device/Increase time) Transfers Transfers: Sit to Stand;Stand to Sit Sit to Stand: 4: Min assist;From chair/3-in-1;With armrests Stand to Sit: 4: Min guard;To chair/3-in-1;With armrests Details for Transfer Assistance: Assist to rise, stabilize.  Ambulation/Gait Ambulation/Gait Assistance: 4: Min guard Ambulation Distance (Feet): 150 Feet Ambulation/Gait Assistance Details: Pt having some increased R LE  pain last night and today so decreased ambulation distance.  Gait Pattern: Step-through pattern;Decreased stride length    Exercises General Exercises - Lower Extremity Long Arc Quad: AROM;Both;10 reps;Seated   PT Diagnosis:    PT Problem List:   PT Treatment Interventions:     PT Goals (current goals can now be found in the care plan section) Acute Rehab PT Goals Patient Stated Goal: to get stronger  Visit Information  Last PT Received On: 12/19/12 Assistance Needed: +1 History of Present Illness: 79 year old male with past medical history of hypertension, hypothyroidism, BPH, DM who presented to [facility] ED with worsening pain in his right leg for past 1-2 days prior to this admission. Patient reported associated subjective fever and chills. No complaints of falls. No chest pain, no shortness of breath, no palpitations. No abdominal pain, no nausea and no vomiting.    Subjective Data  Patient Stated Goal: to get stronger   Cognition  Cognition Arousal/Alertness: Awake/alert Behavior During Therapy: WFL for tasks assessed/performed Overall Cognitive Status: Within Functional Limits for tasks assessed    Balance     End of Session PT - End of Session Equipment Utilized During Treatment: Gait belt Activity Tolerance: Patient tolerated treatment well Patient left: in chair;with call bell/phone within reach;with chair alarm set   GP     Rebeca Alert, MPT Pager: [phone]

## 2012-12-20 LAB — GLUCOSE, CAPILLARY: Glucose-Capillary: 192 mg/dL — ABNORMAL HIGH (ref 70–99)

## 2012-12-20 LAB — BASIC METABOLIC PANEL
BUN: 28 mg/dL — ABNORMAL HIGH (ref 6–23)
GFR calc non Af Amer: 32 mL/min — ABNORMAL LOW (ref >90)
Glucose, Bld: 110 mg/dL — ABNORMAL HIGH (ref 70–99)
Potassium: 4 mEq/L (ref 3.5–5.1)

## 2012-12-20 LAB — CBC
HCT: 33.9 % — ABNORMAL LOW (ref 39.0–52.0)
Hemoglobin: 11 g/dL — ABNORMAL LOW (ref 13.0–17.0)
MCH: 29.7 pg (ref 26.0–34.0)
MCHC: 32.4 g/dL (ref 30.0–36.0)

## 2012-12-20 NOTE — Progress Notes (Signed)
RUA PICC line removed.  SIte cleansed with CHG wipe, covered with vaseline guaze and dry 4x4.  No bleeding or signs of infection noted.  No ADN.  For transfer to ALF.  Verbalizes signs of infection , intervention for infection and bleeding via teach back method.

## 2012-12-20 NOTE — Progress Notes (Signed)
Called and  gave report to [place] at [facility]. Patient will be leaving soon via transport. Erskin Burnet RN

## 2012-12-20 NOTE — Discharge Summary (Addendum)
Physician Discharge Summary  [name] ZOX:[id number] DOB: May 25, 1914 DOA: 12/17/2012  PCP: Sissy Hoff, MD  Admit date: 12/17/2012 Discharge date: 12/20/2012  Time spent: 35 minutes  Recommendations for Outpatient Follow-up:  1. Please repeat B-met in 24 hours, resume lasix if renal function stable.  2. Need referral to podiatrist.  3. Needs to work with PT three times per week.   Discharge Diagnoses:    SIRS   Cellulitis of right lower extremity   Acute on chronic renal failure   Hypothyroidism   HTN (hypertension)   DM (diabetes mellitus)   BPH (benign prostatic hyperplasia)     Leukocytosis, unspecified   Discharge Condition: Stable.   Diet recommendation:   Filed Weights   12/18/12 0507 12/19/12 0622 12/20/12 0454  Weight: 85.6 kg (188 lb 11.4 oz) 86.592 kg (190 lb 14.4 oz) 88.2 kg (194 lb 7.1 oz)    History of present illness:  79 year old male with past medical history of hypertension, hypothyroidism, BPH, DM who presented to [facility] ED with worsening pain in his right leg for past 1-2 days prior to this admission. Patient reported associated subjective fever and chills. No complaints of falls. No chest pain, no shortness of breath, no palpitations. No abdominal pain, no nausea and no vomiting.  In ED, BP is on soft side, 96/43, HR 94 and T 102.4 F, O2 saturation is 97% on room air. CBC revealed leukocytosis of 29.2 and creatinine of 1.59. His right lower extremity was markedly swollen and erythematous.    Hospital Course:  1-Cellulitis of Right lower extremity  - Patient received vancomycin and zosyn for 3 days.  -Patient will be discharge on ciprofloxacin and doxycycline for total of 10 days.  -Redness and swelling has decreases.  - wound care consult appreciated. Patient need to follow up with podiatrist.  - LE venous doppler with no obvious evidence of DVT  -Vicodin for pain of right lower extremity as needed.   2-SIRS: Patient with leukocytosis,  tachycardia, fever, mild hypotension. Likely secondary to Right LE cellulitis.  -Continue with broad spectrum antibiotics.  -Lactic acid normal at 2.2.  -Blood culture no growth to date.  -Resolved.   3-Hypothyroidism  - continue levothyroxine   HTN (hypertension)  -Resume metoprolol and Cardizem.   DM (diabetes mellitus)  -continue Lantus 20 units at bedtime  -sliding scale inusulin   BPH (benign prostatic hyperplasia)  - continue flomax   Acute on chronic renal failure stage 3:  -Last cr per our records at 1.5.  -Worsening renal function in setting of diuretic, hypotension, infection.  - lasix on hold.  -Continue with IV fluids. Cr decrease to 1.6 from 1.9.  -Please repeat renal function, consider resume lasix.   Leukocytosis, unspecified  - secondary to cellulitis  -WBC trending down to 12 from 29.    Procedures:  none  Consultations:  none  Discharge Exam: Filed Vitals:   12/18/12 2216 12/19/12 0622 12/19/12 1300 12/20/12 0454  BP: 131/65 140/73 145/52 145/74  Pulse: 93 100 64 64  Temp: 98.9 F (37.2 C) 98.1 F (36.7 C) 97.1 F (36.2 C) 97.9 F (36.6 C)  TempSrc: Oral Oral Oral Oral  Resp: 20 24 22 20   Height:      Weight:  86.592 kg (190 lb 14.4 oz)  88.2 kg (194 lb 7.1 oz)  SpO2: 95% 100% 97% 99%    General: No distress.  Cardiovascular: S 1, S 2 RRR Respiratory: CTA Right LE with decrease redness.  Discharge Instructions  Discharge Orders   Future Orders Complete By Expires     Diet - low sodium heart healthy  As directed     Diet - low sodium heart healthy  As directed     Increase activity slowly  As directed     Increase activity slowly  As directed         Medication List    STOP taking these medications       furosemide 40 MG tablet  Commonly known as:  LASIX     loperamide 2 MG capsule  Commonly known as:  IMODIUM      TAKE these medications       acetaminophen 500 MG tablet  Commonly known as:  TYLENOL  Take 500 mg  by mouth every 4 (four) hours as needed. Fever/pain     alum & mag hydroxide-simeth 200-200-20 MG/5ML suspension  Commonly known as:  MAALOX/MYLANTA  Take 30 mLs by mouth every 6 (six) hours as needed for indigestion.     beta carotene w/minerals tablet  Take 1 tablet by mouth daily.     ciprofloxacin 500 MG tablet  Commonly known as:  CIPRO  Take 0.5 tablets (250 mg total) by mouth 2 (two) times daily.     diltiazem 180 MG 24 hr capsule  Commonly known as:  CARDIZEM CD  Take 1 capsule (180 mg total) by mouth daily.     doxycycline 50 MG capsule  Commonly known as:  VIBRAMYCIN  Take 2 capsules (100 mg total) by mouth 2 (two) times daily.     DUREZOL 0.05 % Emul  Generic drug:  Difluprednate  Apply 1 drop to eye daily.     feeding supplement Liqd  Take 237 mLs by mouth 2 (two) times daily between meals.     HYDROcodone-acetaminophen 5-325 MG per tablet  Commonly known as:  NORCO/VICODIN  Take 1-2 tablets by mouth every 4 (four) hours as needed.     insulin glargine 100 UNIT/ML injection  Commonly known as:  LANTUS  Inject 20 Units into the skin at bedtime.     insulin lispro 100 UNIT/ML injection  Commonly known as:  HUMALOG  Inject 5 Units into the skin as needed for high blood sugar (As needed for FSBS over 350).     IOPHEN-NR 100 MG/5ML liquid  Generic drug:  guaiFENesin  Take 200 mg by mouth every 6 (six) hours as needed. For cough     isosorbide mononitrate 30 MG 24 hr tablet  Commonly known as:  IMDUR  Take 1 tablet (30 mg total) by mouth daily.     levothyroxine 112 MCG tablet  Commonly known as:  SYNTHROID, LEVOTHROID  Take 112 mcg by mouth daily.     LORazepam 0.5 MG tablet  Commonly known as:  ATIVAN  Take 0.5 mg by mouth at bedtime as needed. Sleeping     magnesium hydroxide 400 MG/5ML suspension  Commonly known as:  MILK OF MAGNESIA  Take 30 mLs by mouth at bedtime as needed for constipation.     metoprolol 50 MG tablet  Commonly known as:   LOPRESSOR  Take 1 tablet (50 mg total) by mouth 2 (two) times daily.     nitroGLYCERIN 0.4 MG SL tablet  Commonly known as:  NITROSTAT  Place 1 tablet (0.4 mg total) under the tongue every 5 (five) minutes x 3 doses as needed for chest pain.     sodium chloride 0.65 % nasal spray  Commonly known as:  OCEAN  Place 1 spray into the nose as needed for congestion.     sodium chloride 5 % ophthalmic solution  Commonly known as:  MURO 128  Place 1 drop into the left eye 4 (four) times daily.     tamsulosin 0.4 MG Caps  Commonly known as:  FLOMAX  Take 0.4 mg by mouth daily.     Vitamin D (Ergocalciferol) 50000 UNITS Caps  Commonly known as:  DRISDOL  Take 50,000 Units by mouth.       Allergies  Allergen Reactions  . Accupril (Quinapril Hcl) Other (See Comments)    unknown  . Naproxen Rash       Follow-up Information   Follow up with Sissy Hoff, MD In 1 week.   Contact information:   [address] [place] [address] [phone]        The results of significant diagnostics from this hospitalization (including imaging, microbiology, ancillary and laboratory) are listed below for reference.    Significant Diagnostic Studies: Dg Chest Port 1 View  12/18/2012   *RADIOLOGY REPORT*  Clinical Data: Right PICC placement.  PORTABLE CHEST - 1 VIEW  Comparison: 09/19/2012  Findings: A right PICC line has been placed with tip over the cavoatrial junction.  No pneumothorax.  Borderline heart size and pulmonary vascularity are likely normal due to shallow inspiration. There is a small left pleural effusion or pleural thickening with basilar atelectasis.  This is similar to previous study. Calcification of the aorta.  Degenerative changes in the shoulders.  IMPRESSION: Right PICC line tip over the cavoatrial junction.  No pneumothorax. Persistent fluid or thickened pleura on the left with atelectasis in the left lung base.   Original Report Authenticated By: Burman Nieves,  M.D.    Microbiology: Recent Results (from the past 240 hour(s))  CULTURE, BLOOD (ROUTINE X 2)     Status: None   Collection Time    12/17/12  1:00 PM      Result Value Range Status   Specimen Description BLOOD LEFT FOREARM   Final   Special Requests BOTTLES DRAWN AEROBIC AND ANAEROBIC   Final   Culture  Setup Time 12/17/2012 17:23   Final   Culture     Final   Value:        BLOOD CULTURE RECEIVED NO GROWTH TO DATE CULTURE WILL BE HELD FOR 5 DAYS BEFORE ISSUING A FINAL NEGATIVE REPORT   Report Status PENDING   Incomplete  CULTURE, BLOOD (ROUTINE X 2)     Status: None   Collection Time    12/17/12  1:05 PM      Result Value Range Status   Specimen Description BLOOD AC   Final   Special Requests BOTTLES DRAWN AEROBIC ONLY   Final   Culture  Setup Time 12/17/2012 17:23   Final   Culture     Final   Value:        BLOOD CULTURE RECEIVED NO GROWTH TO DATE CULTURE WILL BE HELD FOR 5 DAYS BEFORE ISSUING A FINAL NEGATIVE REPORT   Report Status PENDING   Incomplete  URINE CULTURE     Status: None   Collection Time    12/17/12  7:52 PM      Result Value Range Status   Specimen Description URINE, CLEAN CATCH   Final   Special Requests NONE   Final   Culture  Setup Time 12/18/2012 00:21   Final   Colony Count  NO GROWTH   Final   Culture NO GROWTH   Final   Report Status 12/18/2012 FINAL   Final  MRSA PCR SCREENING     Status: None   Collection Time    12/17/12  8:08 PM      Result Value Range Status   MRSA by PCR NEGATIVE  NEGATIVE Final   Comment:            The GeneXpert MRSA Assay (FDA     approved for NASAL specimens     only), is one component of a     comprehensive MRSA colonization     surveillance program. It is not     intended to diagnose MRSA     infection nor to guide or     monitor treatment for     MRSA infections.     Labs: Basic Metabolic Panel:  Recent Labs Lab 12/17/12 1215 12/17/12 1535 12/18/12 0508 12/19/12 0433 12/20/12 0350  NA 135 134*  134* 136 137  K 4.7 4.1 3.8 3.8 4.0  CL 96 96 96 101 102  CO2 26 27 30 29 29   GLUCOSE 261* 273* 192* 146* 110*  BUN 32* 33* 35* 29* 28*  CREATININE 1.59* 1.68* 1.90* 1.62* 1.68*  CALCIUM 9.7 9.2 9.1 9.1 9.2  MG  --  2.1  --   --   --   PHOS  --  3.1  --   --   --    Liver Function Tests:  Recent Labs Lab 12/18/12 0508  AST 9  ALT 6  ALKPHOS 68  BILITOT 0.9  PROT 5.4*  ALBUMIN 2.7*   No results found for this basename: LIPASE, AMYLASE,  in the last 168 hours No results found for this basename: AMMONIA,  in the last 168 hours CBC:  Recent Labs Lab 12/17/12 1215 12/18/12 0508 12/19/12 0433 12/20/12 0350  WBC 29.2* 28.5* 17.5* 12.6*  NEUTROABS 24.2*  --   --   --   HGB 13.9 11.2* 11.4* 11.0*  HCT 41.5 34.0* 34.8* 33.9*  MCV 90.2 90.2 90.9 91.6  PLT 206 155 156 146*   Cardiac Enzymes: No results found for this basename: CKTOTAL, CKMB, CKMBINDEX, TROPONINI,  in the last 168 hours BNP: BNP (last 3 results)  Recent Labs  03/14/12 2157  PROBNP 13433.0*   CBG:  Recent Labs Lab 12/19/12 0739 12/19/12 1142 12/19/12 1641 12/19/12 2126 12/20/12 0712  GLUCAP 155* 178* 123* 199* 109*       Signed:  Chadley Dziedzic  [facility] 12/20/2012, 8:21 AM

## 2012-12-20 NOTE — Progress Notes (Signed)
Met with Pt to discuss d/c plans.  Pt wanting to go back to [facility] and is elated to hear that they can provide the PT that he needs.  He was not interested in going anywhere but back to [facility].  Faxed requested documents to [facility].  Confirmed receipt of these documents.  Facility ready to receive Pt.  Arranged for ambulance transportation.  Pt to be d/c'd.  Providence Crosby, LCSWA Clinical Social Work [phone]

## 2012-12-20 NOTE — Progress Notes (Signed)
Mild confusion surrounding Pt's need for SNF.  Per RN, Pt's son called and was unhappy that Pt wasn't consulted in the d/c planning.  Pt's son told RN that Pt's daughter was contacted (daughter and son, per RN, don't get along) and that decisions were made without Pt's input.  Attempted to meet with Pt.  Pt getting his hair cut, at this time.  Per RN, Pt hard of hearing.  CSW to wait until Pt is finished getting his haircut, as he and the cutter are talking loudly and CSW is concerned about relaying accurated information to Pt.  CSW to contact family.  Spoke with Pt's son who was under the impression that Pt needed IV antibiotics, thus the need for SNF.  Pt's son asking how many more days Pt will need IV antibiotics.    Spoke with MD.  Per MD, Pt no longer in need of IV antibiotics.  MD feels that if Pt's ALF can provide PT, Pt can return back to ALF.  Contacted ALF and spoke with RN [place].  [place] stated that they can provide PT via [place]; [place] requesting an order from MD stating Pt's PT needs, including frequency.  [place] requesting d/c summary, PT order and FL2.  Notified MD.  MD to write the PT order.  Notified Pt's son.  Pt's son happy with this decision.    LM for the RN supervisor at [facility] and sent a text message to [place], at [place].  Providence Crosby, LCSWA Clinical Social Work [phone]

## 2012-12-22 NOTE — Care Management Note (Signed)
    Page 1 of 2   12/22/2012     1:04:02 PM   CARE MANAGEMENT NOTE 12/22/2012  Patient:  [name], [name]   Account Number:  [id number]  Date Initiated:  12/17/2012  Documentation initiated by:  Lanier Clam  Subjective/Objective Assessment:   ADMITTED W/L LEG PAIN/ CELLULITIS.     Action/Plan:   FROM HOME.HAS PCP,PHARMACY.   Anticipated DC Date:  12/20/2012   Anticipated DC Plan:  ASSISTED LIVING / REST HOME  In-house referral  Clinical Social Worker      DC Planning Services  CM consult      Choice offered to / List presented to:          [facility] arranged  HH-2 PT      [facility] agency  [facility]   Status of service:  Completed, signed off Medicare Important Message given?   (If response is "NO", the following Medicare IM given date fields will be blank) Date Medicare IM given:   Date Additional Medicare IM given:    Discharge Disposition:  ASSISTED LIVING  Per UR Regulation:  Reviewed for med. necessity/level of care/duration of stay  If discussed at Long Length of Stay Meetings, dates discussed:    Comments:  12/19/12-- Duke Salvia [phone] Mr Yang wants to return to ALF per LCSW.[facility] states PT services can be provided there thru [place] per LCSW. Made Lupita Leash with Genevieve Norlander aware just in case she needed to know. Patient resides at [facility]. MD order [facility] PT and that was sent with records along with patient via LCSW process.   12/17/12 Yalena Colon RN,BSN NCM [phone] RECOMMEND PT/OT CONS WHEN MD FEEL APPROPRIATE.

## 2012-12-23 LAB — CULTURE, BLOOD (ROUTINE X 2): Culture: NO GROWTH

## 2013-04-07 ENCOUNTER — Other Ambulatory Visit: Payer: Self-pay | Admitting: Urology

## 2013-04-07 DIAGNOSIS — C61 Malignant neoplasm of prostate: Secondary | ICD-10-CM

## 2013-05-18 ENCOUNTER — Encounter (HOSPITAL_COMMUNITY)
Admission: RE | Admit: 2013-05-18 | Discharge: 2013-05-18 | Disposition: A | Discharge status: Home / Self Care | Payer: Medicare Other | Source: Ambulatory Visit | Attending: Urology | Admitting: Urology

## 2013-05-18 DIAGNOSIS — C61 Malignant neoplasm of prostate: Secondary | ICD-10-CM

## 2013-05-18 MED ORDER — TECHNETIUM TC 99M MEDRONATE IV KIT
25.0000 | PACK | Freq: Once | INTRAVENOUS | Status: AC | PRN
  Administered 2013-05-18: 25 via INTRAVENOUS

## 2013-05-21 ENCOUNTER — Emergency Department (HOSPITAL_COMMUNITY): Payer: Medicare Other

## 2013-05-21 ENCOUNTER — Encounter (HOSPITAL_COMMUNITY): Payer: Self-pay | Admitting: Emergency Medicine

## 2013-05-21 ENCOUNTER — Inpatient Hospital Stay (HOSPITAL_COMMUNITY)
Admission: EM | Admit: 2013-05-21 | Discharge: 2013-05-25 | DRG: 690 | Disposition: A | Discharge status: Nursing Home (Includes ICF) | Payer: Medicare Other | Attending: Internal Medicine | Admitting: Internal Medicine

## 2013-05-21 DIAGNOSIS — E039 Hypothyroidism, unspecified: Secondary | ICD-10-CM | POA: Diagnosis present

## 2013-05-21 DIAGNOSIS — E119 Type 2 diabetes mellitus without complications: Secondary | ICD-10-CM | POA: Diagnosis present

## 2013-05-21 DIAGNOSIS — N400 Enlarged prostate without lower urinary tract symptoms: Secondary | ICD-10-CM | POA: Diagnosis present

## 2013-05-21 DIAGNOSIS — Z8249 Family history of ischemic heart disease and other diseases of the circulatory system: Secondary | ICD-10-CM

## 2013-05-21 DIAGNOSIS — I129 Hypertensive chronic kidney disease with stage 1 through stage 4 chronic kidney disease, or unspecified chronic kidney disease: Secondary | ICD-10-CM | POA: Diagnosis present

## 2013-05-21 DIAGNOSIS — I4891 Unspecified atrial fibrillation: Secondary | ICD-10-CM | POA: Diagnosis present

## 2013-05-21 DIAGNOSIS — C801 Malignant (primary) neoplasm, unspecified: Secondary | ICD-10-CM | POA: Diagnosis present

## 2013-05-21 DIAGNOSIS — Z7982 Long term (current) use of aspirin: Secondary | ICD-10-CM

## 2013-05-21 DIAGNOSIS — K5730 Diverticulosis of large intestine without perforation or abscess without bleeding: Secondary | ICD-10-CM | POA: Diagnosis present

## 2013-05-21 DIAGNOSIS — D72829 Elevated white blood cell count, unspecified: Secondary | ICD-10-CM | POA: Diagnosis present

## 2013-05-21 DIAGNOSIS — C61 Malignant neoplasm of prostate: Secondary | ICD-10-CM | POA: Diagnosis present

## 2013-05-21 DIAGNOSIS — N179 Acute kidney failure, unspecified: Secondary | ICD-10-CM

## 2013-05-21 DIAGNOSIS — N189 Chronic kidney disease, unspecified: Secondary | ICD-10-CM | POA: Diagnosis present

## 2013-05-21 DIAGNOSIS — N390 Urinary tract infection, site not specified: Principal | ICD-10-CM | POA: Diagnosis present

## 2013-05-21 DIAGNOSIS — R5381 Other malaise: Secondary | ICD-10-CM | POA: Diagnosis present

## 2013-05-21 DIAGNOSIS — Z9849 Cataract extraction status, unspecified eye: Secondary | ICD-10-CM

## 2013-05-21 DIAGNOSIS — I10 Essential (primary) hypertension: Secondary | ICD-10-CM | POA: Diagnosis present

## 2013-05-21 DIAGNOSIS — N200 Calculus of kidney: Secondary | ICD-10-CM | POA: Diagnosis present

## 2013-05-21 DIAGNOSIS — Z85820 Personal history of malignant melanoma of skin: Secondary | ICD-10-CM

## 2013-05-21 DIAGNOSIS — I214 Non-ST elevation (NSTEMI) myocardial infarction: Secondary | ICD-10-CM

## 2013-05-21 LAB — URINE MICROSCOPIC-ADD ON

## 2013-05-21 LAB — COMPREHENSIVE METABOLIC PANEL
AST: 9 U/L (ref 0–37)
Albumin: 3.2 g/dL — ABNORMAL LOW (ref 3.5–5.2)
Alkaline Phosphatase: 74 U/L (ref 39–117)
BUN: 27 mg/dL — ABNORMAL HIGH (ref 6–23)
CO2: 28 mEq/L (ref 19–32)
Chloride: 94 mEq/L — ABNORMAL LOW (ref 96–112)
Creatinine, Ser: 1.78 mg/dL — ABNORMAL HIGH (ref 0.50–1.35)
GFR calc Af Amer: 35 mL/min — ABNORMAL LOW (ref >90)
GFR calc non Af Amer: 30 mL/min — ABNORMAL LOW (ref >90)
Glucose, Bld: 285 mg/dL — ABNORMAL HIGH (ref 70–99)
Sodium: 133 mEq/L — ABNORMAL LOW (ref 135–145)
Total Bilirubin: 0.9 mg/dL (ref 0.3–1.2)

## 2013-05-21 LAB — CBC WITH DIFFERENTIAL/PLATELET
Basophils Absolute: 0 10*3/uL (ref 0.0–0.1)
Basophils Relative: 0 % (ref 0–1)
Eosinophils Absolute: 0 10*3/uL (ref 0.0–0.7)
HCT: 39.4 % (ref 39.0–52.0)
Hemoglobin: 13.2 g/dL (ref 13.0–17.0)
Lymphocytes Relative: 19 % (ref 12–46)
MCH: 30.5 pg (ref 26.0–34.0)
MCHC: 33.5 g/dL (ref 30.0–36.0)
Monocytes Absolute: 3.2 10*3/uL — ABNORMAL HIGH (ref 0.1–1.0)
Neutrophils Relative %: 69 % (ref 43–77)
Platelets: 169 10*3/uL (ref 150–400)
RDW: 14.3 % (ref 11.5–15.5)

## 2013-05-21 LAB — URINALYSIS, ROUTINE W REFLEX MICROSCOPIC
Glucose, UA: NEGATIVE mg/dL
Nitrite: NEGATIVE
Protein, ur: >300 mg/dL — AB
Urobilinogen, UA: 0.2 mg/dL (ref 0.0–1.0)

## 2013-05-21 LAB — CBC
Platelets: 188 10*3/uL (ref 150–400)
RDW: 14.3 % (ref 11.5–15.5)
WBC: 25.8 10*3/uL — ABNORMAL HIGH (ref 4.0–10.5)

## 2013-05-21 LAB — BASIC METABOLIC PANEL
Chloride: 93 mEq/L — ABNORMAL LOW (ref 96–112)
GFR calc Af Amer: 37 mL/min — ABNORMAL LOW (ref >90)
GFR calc non Af Amer: 32 mL/min — ABNORMAL LOW (ref >90)
Potassium: 4.2 mEq/L (ref 3.5–5.1)
Sodium: 132 mEq/L — ABNORMAL LOW (ref 135–145)

## 2013-05-21 LAB — PROTIME-INR: Prothrombin Time: 13.6 seconds (ref 11.6–15.2)

## 2013-05-21 LAB — APTT: aPTT: 31 seconds (ref 24–37)

## 2013-05-21 LAB — PHOSPHORUS: Phosphorus: 3.6 mg/dL (ref 2.3–4.6)

## 2013-05-21 LAB — MAGNESIUM: Magnesium: 2.4 mg/dL (ref 1.5–2.5)

## 2013-05-21 MED ORDER — ONDANSETRON HCL 4 MG/2ML IJ SOLN: 4.0000 mg | Freq: Four times a day (QID) | INTRAMUSCULAR | Status: DC | PRN

## 2013-05-21 MED ORDER — ALUM & MAG HYDROXIDE-SIMETH 200-200-20 MG/5ML PO SUSP: 30.0000 mL | Freq: Four times a day (QID) | ORAL | Status: DC | PRN

## 2013-05-21 MED ORDER — ACETAMINOPHEN 325 MG PO TABS: 650.0000 mg | ORAL_TABLET | Freq: Four times a day (QID) | ORAL | Status: DC | PRN

## 2013-05-21 MED ORDER — ONDANSETRON HCL 4 MG PO TABS: 4.0000 mg | ORAL_TABLET | Freq: Four times a day (QID) | ORAL | Status: DC | PRN

## 2013-05-21 MED ORDER — TAMSULOSIN HCL 0.4 MG PO CAPS
0.4000 mg | ORAL_CAPSULE | Freq: Every day | ORAL | Status: DC
  Administered 2013-05-21 – 2013-05-25 (×5): 0.4 mg via ORAL
  Filled 2013-05-21 (×5): qty 1

## 2013-05-21 MED ORDER — GLUCERNA SHAKE PO LIQD
237.0000 mL | Freq: Two times a day (BID) | ORAL | Status: DC
  Administered 2013-05-22 – 2013-05-25 (×4): 237 mL via ORAL
  Filled 2013-05-21 (×8): qty 237

## 2013-05-21 MED ORDER — DEXTROSE 5 % IV SOLN
1.0000 g | Freq: Once | INTRAVENOUS | Status: AC
  Administered 2013-05-21: 1 g via INTRAVENOUS
  Filled 2013-05-21: qty 10

## 2013-05-21 MED ORDER — VITAMIN D (ERGOCALCIFEROL) 1.25 MG (50000 UNIT) PO CAPS
50000.0000 [IU] | ORAL_CAPSULE | ORAL | Status: DC
  Administered 2013-05-21: 50000 [IU] via ORAL
  Filled 2013-05-21: qty 1

## 2013-05-21 MED ORDER — LOPERAMIDE HCL 2 MG PO CAPS: 2.0000 mg | ORAL_CAPSULE | ORAL | Status: DC | PRN

## 2013-05-21 MED ORDER — GUAIFENESIN 100 MG/5ML PO LIQD
200.0000 mg | Freq: Four times a day (QID) | ORAL | Status: DC | PRN
  Filled 2013-05-21: qty 10

## 2013-05-21 MED ORDER — DILTIAZEM HCL ER COATED BEADS 180 MG PO CP24
180.0000 mg | ORAL_CAPSULE | Freq: Every day | ORAL | Status: DC
  Administered 2013-05-22 – 2013-05-25 (×4): 180 mg via ORAL
  Filled 2013-05-21 (×4): qty 1

## 2013-05-21 MED ORDER — SODIUM CHLORIDE 0.9 % IV BOLUS (SEPSIS): 1000.0000 mL | Freq: Once | INTRAVENOUS | Status: DC

## 2013-05-21 MED ORDER — ISOSORBIDE MONONITRATE ER 30 MG PO TB24
30.0000 mg | ORAL_TABLET | Freq: Every day | ORAL | Status: DC
  Administered 2013-05-22 – 2013-05-25 (×4): 30 mg via ORAL
  Filled 2013-05-21 (×4): qty 1

## 2013-05-21 MED ORDER — SODIUM CHLORIDE 0.9 % IV SOLN
INTRAVENOUS | Status: DC
  Administered 2013-05-21: 18:00:00 via INTRAVENOUS
  Administered 2013-05-22: 50 mL/h via INTRAVENOUS
  Administered 2013-05-22 – 2013-05-23 (×3): via INTRAVENOUS

## 2013-05-21 MED ORDER — HYDROCODONE-ACETAMINOPHEN 5-325 MG PO TABS: 1.0000 | ORAL_TABLET | ORAL | Status: DC | PRN

## 2013-05-21 MED ORDER — ACETAMINOPHEN 650 MG RE SUPP: 650.0000 mg | Freq: Four times a day (QID) | RECTAL | Status: DC | PRN

## 2013-05-21 MED ORDER — HYDROCERIN EX CREA
1.0000 "application " | TOPICAL_CREAM | Freq: Two times a day (BID) | CUTANEOUS | Status: DC
  Administered 2013-05-21 – 2013-05-25 (×7): 1 via TOPICAL
  Filled 2013-05-21: qty 113

## 2013-05-21 MED ORDER — LORAZEPAM 0.5 MG PO TABS
0.5000 mg | ORAL_TABLET | Freq: Every evening | ORAL | Status: DC | PRN
  Administered 2013-05-24: 0.5 mg via ORAL
  Filled 2013-05-21: qty 1

## 2013-05-21 MED ORDER — GLIPIZIDE ER 10 MG PO TB24: 10.0000 mg | ORAL_TABLET | Freq: Two times a day (BID) | ORAL | Status: DC

## 2013-05-21 MED ORDER — FUROSEMIDE 40 MG PO TABS
40.0000 mg | ORAL_TABLET | Freq: Every day | ORAL | Status: DC
  Administered 2013-05-22: 40 mg via ORAL
  Filled 2013-05-21: qty 1

## 2013-05-21 MED ORDER — GLIPIZIDE 10 MG PO TABS
10.0000 mg | ORAL_TABLET | Freq: Two times a day (BID) | ORAL | Status: DC
  Administered 2013-05-21 – 2013-05-25 (×8): 10 mg via ORAL
  Filled 2013-05-21 (×10): qty 1

## 2013-05-21 MED ORDER — SODIUM CHLORIDE 0.9 % IV BOLUS (SEPSIS)
500.0000 mL | Freq: Once | INTRAVENOUS | Status: AC
  Administered 2013-05-21: 500 mL via INTRAVENOUS

## 2013-05-21 MED ORDER — ASPIRIN 81 MG PO TABS
81.0000 mg | ORAL_TABLET | Freq: Every day | ORAL | Status: DC
  Administered 2013-05-21 – 2013-05-25 (×5): 81 mg via ORAL
  Filled 2013-05-21 (×5): qty 1

## 2013-05-21 MED ORDER — METOPROLOL TARTRATE 50 MG PO TABS
50.0000 mg | ORAL_TABLET | Freq: Two times a day (BID) | ORAL | Status: DC
  Administered 2013-05-21 – 2013-05-25 (×8): 50 mg via ORAL
  Filled 2013-05-21 (×9): qty 1

## 2013-05-21 MED ORDER — MAGNESIUM HYDROXIDE 400 MG/5ML PO SUSP: 30.0000 mL | Freq: Every evening | ORAL | Status: DC | PRN

## 2013-05-21 MED ORDER — DEXTROSE 5 % IV SOLN
1.0000 g | INTRAVENOUS | Status: DC
  Administered 2013-05-22 – 2013-05-23 (×2): 1 g via INTRAVENOUS
  Filled 2013-05-21 (×3): qty 10

## 2013-05-21 NOTE — ED Notes (Signed)
Patient transported to CT 

## 2013-05-21 NOTE — ED Notes (Signed)
Bed: AV40 Expected date: 05/21/13 Expected time: 12:19 PM Means of arrival:  Comments: EMS-Abd pain

## 2013-05-21 NOTE — Progress Notes (Signed)
[facility] History and Physical  NASIM HABEEB ZOX:[id number] DOB: Nov 01, 1913 DOA: 05/21/2013  Referring physician: ER physician PCP: Sissy Hoff, MD   Chief Complaint: blood in urine  HPI:  79 year old male with past medical history of hypertension, hypothyroidism, BPH, DM, prostate cancer who presented to [facility] ED 05/21/2013 from nursing home with blood in urine started today on the day of this admission. Pt reported having associated abdominal discomfort but no nausea or vomiting. No diarrhea or constipation. NO fever or chills. No lightheadedness or loss of consciousness. In ED, BP is on soft side, 144/70, HR 84 and T 99 F, O2 saturation is 94% on room air. CBC revealed leukocytosis of 26 and BMP revealed creatinine of 1.71. UA revealed large leukocyte esterases. CT abdomen revealed multiple findings related to prostate cancer; non-obstructing calculi within left kidney. Pt was started on rocephin in ED.  Assessment and Plan:   Principal Problem:  UTI - continue rocephin - follow up urine culture results   Active Problems:  HTN (hypertension)  - continue Cardizem 180 mg daily, metoprolol 50 mg twice daily and Imdur 30 mg daily   DM (diabetes mellitus)  - continue glipizide BPH (benign prostatic hyperplasia)  - continue flomax  Acute renal failure  - perhaps due to dehydration - continue IV fluids - follow up BMP in am - baseline creatinine since last admission is 1.5 Leukocytosis, unspecified  - secondary to UTI Prostate cancer, primary, with metastasis from prostate to other site - per CT abdomen, improving multiple sites of prostate cancer   Radiological Exams on Admission: Ct Abdomen Pelvis Wo Contrast 05/21/2013    IMPRESSION: 1. No acute abnormalities involving the abdomen or pelvis. 2. Multiple findings related to metastatic prostate cancer as noted previously, though imaging findings are improved since March, 2013.;3. Distal descending and sigmoid colon  diverticulosis without evidence of acute diverticulitis. 4. Non-obstructing calculi within the left side of the previously identified horseshoe kidney. 5. Stable marked pancreatic atrophy.      Code Status: Full Family Communication: Pt at bedside Disposition Plan: Admit for further evaluation  Manson Passey, MD  [facility] Pager [phone]  Review of Systems:  Constitutional: Negative for fever, chills and malaise/fatigue. Negative for diaphoresis.  HENT: Negative for hearing loss, ear pain, nosebleeds, congestion, sore throat, neck pain, tinnitus and ear discharge.   Eyes: Negative for blurred vision, double vision, photophobia, pain, discharge and redness.  Respiratory: Negative for cough, hemoptysis, sputum production, shortness of breath, wheezing and stridor.   Cardiovascular: Negative for chest pain, palpitations, orthopnea, claudication and leg swelling.  Gastrointestinal: Negative for nausea, vomiting and abdominal pain. Negative for heartburn, constipation, blood in stool and melena.  Genitourinary: per HPI  Musculoskeletal: Negative for myalgias, back pain, joint pain and falls.  Skin: Negative for itching and rash.  Neurological: Negative for dizziness and weakness. Negative for tingling, tremors, sensory change, speech change, focal weakness, loss of consciousness and headaches.  Endo/Heme/Allergies: Negative for environmental allergies and polydipsia. Does not bruise/bleed easily.  Psychiatric/Behavioral: Negative for suicidal ideas. The patient is not nervous/anxious.      Past Medical History  Diagnosis Date  . Prostate cancer   . Hypothyroidism   . Hyperglycemia   . DMII (diabetes mellitus, type 2)   . Prostate cancer, primary, with metastasis from prostate to other site 12/06/2011  . Prostate cancer metastatic to multiple sites   . A-fib   . CKD (chronic kidney disease)    Past Surgical History  Procedure  Laterality Date  . Melanoma removed from left leg   1971    x 14  . Left cataract extraction    . Prostate surgery    . Sinus exploration     Social History:  reports that he has never smoked. He has never used smokeless tobacco. He reports that he does not drink alcohol or use illicit drugs.  Allergies  Allergen Reactions  . Accupril [Quinapril Hcl] Other (See Comments)    unknown  . Naproxen Rash    Family History  Problem Relation Age of Onset  . CAD Father 28     Prior to Admission medications   Medication Sig Start Date End Date Taking? Authorizing Provider  acetaminophen (TYLENOL) 500 MG tablet Take 500 mg by mouth every 4 (four) hours as needed. Fever/pain   Yes Historical Provider, MD  alum & mag hydroxide-simeth (MAALOX/MYLANTA) 200-200-20 MG/5ML suspension Take 30 mLs by mouth every 6 (six) hours as needed for indigestion.   Yes Historical Provider, MD  aspirin 81 MG tablet Take 81 mg by mouth daily.   Yes Historical Provider, MD  diltiazem (CARDIZEM CD) 180 MG 24 hr capsule Take 1 capsule (180 mg total) by mouth daily. 10/12/11 03/17/22 Yes Lesle Chris Black, NP  feeding supplement (GLUCERNA SHAKE) LIQD Take 237 mLs by mouth 2 (two) times daily between meals. 12/19/12  Yes Belkys A Regalado, MD  furosemide (LASIX) 40 MG tablet Take 40 mg by mouth daily.   Yes Historical Provider, MD  glipiZIDE (GLUCOTROL XL) 10 MG 24 hr tablet Take 10 mg by mouth 2 (two) times daily.   Yes Historical Provider, MD  glipiZIDE (GLUCOTROL) 10 MG tablet Take 10 mg by mouth 2 (two) times daily before a meal.   Yes Historical Provider, MD  guaiFENesin (IOPHEN-NR) 100 MG/5ML liquid Take 200 mg by mouth every 6 (six) hours as needed. For cough   Yes Historical Provider, MD  hydrocerin (EUCERIN) CREA Apply 1 application topically 2 (two) times daily.   Yes Historical Provider, MD  isosorbide mononitrate (IMDUR) 30 MG 24 hr tablet Take 1 tablet (30 mg total) by mouth daily. 03/17/12  Yes Everette Rank, MD  loperamide (IMODIUM) 2 MG capsule Take 2 mg by  mouth as needed for diarrhea or loose stools.   Yes Historical Provider, MD  LORazepam (ATIVAN) 0.5 MG tablet Take 0.5 mg by mouth at bedtime as needed. Sleeping   Yes Historical Provider, MD  magnesium hydroxide (MILK OF MAGNESIA) 400 MG/5ML suspension Take 30 mLs by mouth at bedtime as needed for constipation.   Yes Historical Provider, MD  metoprolol (LOPRESSOR) 50 MG tablet Take 1 tablet (50 mg total) by mouth 2 (two) times daily. 10/12/11 03/17/22 Yes Lesle Chris Black, NP  Multiple Vitamin (THERA/BETA-CAROTENE PO) Take 1 tablet by mouth daily.   Yes Historical Provider, MD  nitroGLYCERIN (NITROSTAT) 0.4 MG SL tablet Place 1 tablet (0.4 mg total) under the tongue every 5 (five) minutes x 3 doses as needed for chest pain. 03/17/12  Yes Everette Rank, MD  Tamsulosin HCl (FLOMAX) 0.4 MG CAPS Take 0.4 mg by mouth daily.   Yes Historical Provider, MD  Vitamin D, Ergocalciferol, (DRISDOL) 50000 UNITS CAPS Take 50,000 Units by mouth.   Yes Historical Provider, MD   Physical Exam: Filed Vitals:   05/21/13 1236  BP: 144/70  Pulse: 84  Temp: 99 F (37.2 C)  TempSrc: Oral  Resp: 16  SpO2: 94%    Physical Exam  Constitutional: Appears well-developed and  well-nourished. No distress.  HENT: Normocephalic. External right and left ear normal.  Eyes: Conjunctivae and EOM are normal. PERRLA, no scleral icterus.  Neck: Normal ROM. Neck supple. No JVD. No tracheal deviation. No thyromegaly.  CVS: RRR, S1/S2 appreciated Pulmonary: Effort and breath sounds normal, no stridor, rhonchi, wheezes, rales.  Abdominal: Soft. BS +,  no distension, tenderness, rebound or guarding.  Musculoskeletal: Normal range of motion. No edema and no tenderness.  Lymphadenopathy: No lymphadenopathy noted, cervical, inguinal. Neuro: Alert. Normal reflexes, No focal neurologic deficits. Skin: Skin is warm and dry.    Labs on Admission:  Basic Metabolic Panel:  Recent Labs Lab 05/21/13 1323  NA 132*  K 4.2  CL 93*  CO2  27  GLUCOSE 305*  BUN 28*  CREATININE 1.71*  CALCIUM 9.6   Liver Function Tests: No results found for this basename: AST, ALT, ALKPHOS, BILITOT, PROT, ALBUMIN,  in the last 168 hours No results found for this basename: LIPASE, AMYLASE,  in the last 168 hours No results found for this basename: AMMONIA,  in the last 168 hours CBC:  Recent Labs Lab 05/21/13 1323  WBC 25.8*  HGB 13.9  HCT 41.8  MCV 91.5  PLT 188   Cardiac Enzymes: No results found for this basename: CKTOTAL, CKMB, CKMBINDEX, TROPONINI,  in the last 168 hours BNP: No components found with this basename: POCBNP,  CBG: No results found for this basename: GLUCAP,  in the last 168 hours  If 7PM-7AM, please contact night-coverage [email or web address] Password [facility] 05/21/2013, 4:12 PM

## 2013-05-21 NOTE — ED Provider Notes (Signed)
CSN: [id number]     Arrival date & time 05/21/13  1230 History   First MD Initiated Contact with Patient 05/21/13 1233     Chief Complaint  Patient presents with  . Hematuria  . Abdominal Pain   (Consider location/radiation/quality/duration/timing/severity/associated sxs/prior Treatment) Patient is a 79 y.o. male presenting with hematuria and abdominal pain. The history is provided by the patient.  Hematuria This is a new problem. The current episode started 6 to 12 hours ago. The problem occurs constantly. The problem has not changed since onset.Associated symptoms include abdominal pain. Pertinent negatives include no chest pain, no headaches and no shortness of breath. Nothing aggravates the symptoms. Nothing relieves the symptoms.  Abdominal Pain Associated symptoms: hematuria   Associated symptoms: no chest pain, no chills, no cough, no diarrhea, no fever, no nausea, no shortness of breath and no vomiting     Past Medical History  Diagnosis Date  . Prostate cancer   . Hypothyroidism   . Hyperglycemia   . DMII (diabetes mellitus, type 2)   . Prostate cancer, primary, with metastasis from prostate to other site 12/06/2011  . Prostate cancer metastatic to multiple sites   . A-fib   . CKD (chronic kidney disease)    Past Surgical History  Procedure Laterality Date  . Melanoma removed from left leg  1971    x 14  . Left cataract extraction    . Prostate surgery    . Sinus exploration     Family History  Problem Relation Age of Onset  . CAD Father 80   History  Substance Use Topics  . Smoking status: Never Smoker   . Smokeless tobacco: Never Used  . Alcohol Use: No    Review of Systems  Constitutional: Negative for fever and chills.  Respiratory: Negative for cough and shortness of breath.   Cardiovascular: Negative for chest pain.  Gastrointestinal: Positive for abdominal pain. Negative for nausea, vomiting and diarrhea.  Genitourinary: Positive for hematuria.   Neurological: Negative for headaches.  All other systems reviewed and are negative.    Allergies  Accupril and Naproxen  Home Medications   Current Outpatient Rx  Name  Route  Sig  Dispense  Refill  . acetaminophen (TYLENOL) 500 MG tablet   Oral   Take 500 mg by mouth every 4 (four) hours as needed. Fever/pain         . alum & mag hydroxide-simeth (MAALOX/MYLANTA) 200-200-20 MG/5ML suspension   Oral   Take 30 mLs by mouth every 6 (six) hours as needed for indigestion.         Marland Kitchen aspirin 81 MG tablet   Oral   Take 81 mg by mouth daily.         Marland Kitchen diltiazem (CARDIZEM CD) 180 MG 24 hr capsule   Oral   Take 1 capsule (180 mg total) by mouth daily.         . feeding supplement (GLUCERNA SHAKE) LIQD   Oral   Take 237 mLs by mouth 2 (two) times daily between meals.   30 Can   0   . furosemide (LASIX) 40 MG tablet   Oral   Take 40 mg by mouth daily.         Marland Kitchen glipiZIDE (GLUCOTROL XL) 10 MG 24 hr tablet   Oral   Take 10 mg by mouth 2 (two) times daily.         Marland Kitchen glipiZIDE (GLUCOTROL) 10 MG tablet   Oral  Take 10 mg by mouth 2 (two) times daily before a meal.         . guaiFENesin (IOPHEN-NR) 100 MG/5ML liquid   Oral   Take 200 mg by mouth every 6 (six) hours as needed. For cough         . hydrocerin (EUCERIN) CREA   Topical   Apply 1 application topically 2 (two) times daily.         . isosorbide mononitrate (IMDUR) 30 MG 24 hr tablet   Oral   Take 1 tablet (30 mg total) by mouth daily.   30 tablet   11   . loperamide (IMODIUM) 2 MG capsule   Oral   Take 2 mg by mouth as needed for diarrhea or loose stools.         Marland Kitchen LORazepam (ATIVAN) 0.5 MG tablet   Oral   Take 0.5 mg by mouth at bedtime as needed. Sleeping         . magnesium hydroxide (MILK OF MAGNESIA) 400 MG/5ML suspension   Oral   Take 30 mLs by mouth at bedtime as needed for constipation.         . metoprolol (LOPRESSOR) 50 MG tablet   Oral   Take 1 tablet (50 mg total)  by mouth 2 (two) times daily.         . Multiple Vitamin (THERA/BETA-CAROTENE PO)   Oral   Take 1 tablet by mouth daily.         . nitroGLYCERIN (NITROSTAT) 0.4 MG SL tablet   Sublingual   Place 1 tablet (0.4 mg total) under the tongue every 5 (five) minutes x 3 doses as needed for chest pain.   25 tablet   6   . Tamsulosin HCl (FLOMAX) 0.4 MG CAPS   Oral   Take 0.4 mg by mouth daily.         . Vitamin D, Ergocalciferol, (DRISDOL) 50000 UNITS CAPS   Oral   Take 50,000 Units by mouth.          BP 144/70  Pulse 84  Temp(Src) 99 F (37.2 C) (Oral)  Resp 16  SpO2 94% Physical Exam  Nursing note and vitals reviewed. Constitutional: He is oriented to person, place, and time. He appears well-developed and well-nourished. No distress.  HENT:  Head: Normocephalic and atraumatic.  Mouth/Throat: No oropharyngeal exudate.  Eyes: EOM are normal. Pupils are equal, round, and reactive to light.  Neck: Normal range of motion. Neck supple.  Cardiovascular: Normal rate and regular rhythm.  Exam reveals no friction rub.   No murmur heard. Pulmonary/Chest: Effort normal and breath sounds normal. No respiratory distress. He has no wheezes. He has no rales.  Abdominal: Soft. He exhibits distension (lower). There is tenderness. There is no rebound.  Musculoskeletal: Normal range of motion. He exhibits no edema.  Neurological: He is alert and oriented to person, place, and time.  Skin: No rash noted. He is not diaphoretic.    ED Course  Procedures (including critical care time) Labs Review Labs Reviewed  URINALYSIS, ROUTINE W REFLEX MICROSCOPIC - Abnormal; Notable for the following:    Color, Urine RED (*)    APPearance TURBID (*)    Hgb urine dipstick LARGE (*)    Protein, ur >300 (*)    Leukocytes, UA LARGE (*)    All other components within normal limits  URINE MICROSCOPIC-ADD ON - Abnormal; Notable for the following:    Bacteria, UA MANY (*)  All other components within  normal limits  URINE CULTURE  CBC  BASIC METABOLIC PANEL   Imaging Review Ct Abdomen Pelvis Wo Contrast  05/21/2013   CLINICAL DATA:  Abdominal pain and distention. Hematuria. Urinary tract infection. Current history of metastatic prostate cancer.  EXAM: CT ABDOMEN AND PELVIS WITHOUT CONTRAST  TECHNIQUE: Multidetector CT imaging of the abdomen and pelvis was performed following the standard protocol without intravenous contrast.  COMPARISON:  08/10/2011.  FINDINGS: Interval reduction in size of the prostate gland since the prior examination, though there is still mild prostate gland enlargement with extension into the base of the urinary bladder. The prostatic tissue at the base of the bladder again obstructs the left ureteral orifice, accounting for marked hydroureteronephrosis involving the left side of the previously demonstrated horseshoe kidney. There is stable mild right hydroureteronephrosis, though the there is no visible obstructing tumor at the right ureteral orifice. Non-obstructing calculi are present in the left side of the horseshoe kidney, the largest approximating 7 mm. Stable simple cysts are present in both sides of the horseshoe kidney, the largest arising exophytically from the right side measuring approximately 5.9 x 5.1 cm, unchanged.  Vague approximate 2 cm mass in the anterior segment right lobe of liver (series 2, image 18) is unchanged and is likely a hemangioma, as it was not visible on the delayed contrast images on the prior examination. Within the limits of the unenhanced technique, no new hepatic parenchymal abnormality. Normal unenhanced appearance of the spleen, adrenal glands, and gallbladder. Marked pancreatic atrophy, unchanged, without focal pancreatic parenchymal abnormality. No biliary ductal dilation. Moderate to severe aorto-iliofemoral atherosclerosis without aneurysm.  Retroperitoneal and mesenteric lymphadenopathy as demonstrated on the prior examination, with  overall slight improvement. Index retroperitoneal node anterior to the infrarenal aorta with current measurements approximating 1.4 x 1.9 cm (previously 1.6 x 2.0 cm). Index mesenteric lymph node in the left upper pelvis with current measurements approximating 1.1 x 1.9 cm (previously 1.5 x 2.7 cm). Mildly enlarged lymph nodes in the porta hepatis, portacaval region, celiac axis arm unchanged. No discrete new or enlarging lymphadenopathy.  Stomach decompressed and unremarkable. Small bowel normal in appearance. Distal descending and sigmoid colon diverticulosis without evidence of acute diverticulitis. Normal appendix in the right upper pelvis. No ascites.  Bone window images demonstrate a sclerotic metastasis in the left iliac bone. Degenerative disc disease is present at L2-3, L4-5 and L5-S1, greatest at L5-S1 were there is also degenerative grade 1 spondylolisthesis of L5 on S1 approximating 6 mm. Small bilateral pleural effusions, left greater than right, with associated passive atelectasis in the lower lobes. Heart mildly enlarged with 3 vessel coronary atherosclerosis and a small pericardial effusion.  IMPRESSION: 1. No acute abnormalities involving the abdomen or pelvis. 2. Multiple findings related to metastatic prostate cancer as noted previously, though imaging findings are improved since March, 2013. Please see above for details. 3. Distal descending and sigmoid colon diverticulosis without evidence of acute diverticulitis. 4. Non-obstructing calculi within the left side of the previously identified horseshoe kidney. 5. Stable marked pancreatic atrophy.   Electronically Signed   By: Hulan Saas M.D.   On: 05/21/2013 15:13    EKG Interpretation   None       MDM   1. UTI (urinary tract infection)    58M presents with hematuria. Began today. Endorses general malaise also. Denies fevers, vomiting, cough, CP. Mild lower abdominal pain. Bloody hematuria without clots here. Concern for UTI,  possible bleeding into bladder causing urinary  outlet obstruction. Bladder scan reveals <30cc. Patient has UTI. Rocephin given. CT Abd/Pelvis without acute findings. Admitted.     Dagmar Hait, MD 05/21/13 [phone]

## 2013-05-21 NOTE — ED Notes (Addendum)
Pt from [facility]. States that since 0500 he has had blood in urine and frequency. Also complained to EMS of periumbilical pain.

## 2013-05-22 LAB — GLUCOSE, CAPILLARY
Glucose-Capillary: 248 mg/dL — ABNORMAL HIGH (ref 70–99)
Glucose-Capillary: 201 mg/dL — ABNORMAL HIGH (ref 70–99)

## 2013-05-22 MED ORDER — INSULIN ASPART 100 UNIT/ML ~~LOC~~ SOLN
0.0000 [IU] | Freq: Three times a day (TID) | SUBCUTANEOUS | Status: DC
  Administered 2013-05-22: 2 [IU] via SUBCUTANEOUS
  Administered 2013-05-23: 3 [IU] via SUBCUTANEOUS
  Administered 2013-05-23 – 2013-05-24 (×3): 2 [IU] via SUBCUTANEOUS
  Administered 2013-05-24: 5 [IU] via SUBCUTANEOUS
  Administered 2013-05-24: 2 [IU] via SUBCUTANEOUS
  Administered 2013-05-25: 3 [IU] via SUBCUTANEOUS

## 2013-05-22 NOTE — Progress Notes (Signed)
INITIAL NUTRITION ASSESSMENT  DOCUMENTATION CODES Per approved criteria  -Not Applicable   INTERVENTION: Continue Glucerna Shake po BID, each supplement provides 220 kcal and 10 grams of protein. Downgrade diet to Dysphagia 3 - pt with poor dentition. RD to continue to follow nutrition care plan.  NUTRITION DIAGNOSIS: Increased nutrient needs related to acute illness as evidenced by estimated needs.   Goal: Intake to meet >90% of estimated nutrition needs.  Monitor:  weight trends, lab trends, I/O's, PO intake, supplement tolerance  Reason for Assessment: MD Consult for Poor PO Intake  79 y.o. male  Admitting Dx: UTI (lower urinary tract infection)  ASSESSMENT: PMHx significant for HTN, hypothyroidism, BPH, DM, and prostate CA. Admitted blood in urine, from SNF. Work-up reveals UTI.  Currently ordered for Carbohydrate Modified Medium diet. Pt is currently consuming 50% of his meals. Ordered for Glucerna Shake PO BID. Pt is drinking his Glucerna Shakes as scheduled. Pt confused, unable to answer my questions.  Nutrition Focused Physical Exam:  Subcutaneous Fat:  Orbital Region: WNL Upper Arm Region: n/a Thoracic and Lumbar Region: n/a  Muscle:  Temple Region: moderate depletion Clavicle Bone Region: moderate depletion Clavicle and Acromion Bone Region: n/a Scapular Bone Region: n/a Dorsal Hand: WNL Patellar Region: WNL Anterior Thigh Region: WNL Posterior Calf Region: WNL  Edema: none  Pt is at risk for malnutrition given moderate muscle mass loss and advanced age.  Sodium is low at 133 but trending up. Blood sugars elevated: 305, 285, 248   Height: Ht Readings from Last 1 Encounters:  05/21/13 5\' 10"  (1.778 m)    Weight: Wt Readings from Last 1 Encounters:  05/22/13 188 lb 9.3 oz (85.541 kg)    Ideal Body Weight: 166 lb/75.5 kg  % Ideal Body Weight: 113%  Wt Readings from Last 10 Encounters:  05/22/13 188 lb 9.3 oz (85.541 kg)  12/20/12 194 lb  7.1 oz (88.2 kg)  09/19/12 186 lb 4.6 oz (84.5 kg)  03/17/12 175 lb 7.8 oz (79.6 kg)  12/07/11 180 lb 12.4 oz (82 kg)  10/12/11 175 lb 7.8 oz (79.6 kg)    Usual Body Weight: 175 - 180 lb  % Usual Body Weight: 106%  BMI:  Body mass index is 27.06 kg/(m^2). Overweight  Estimated Nutritional Needs: Kcal: 1550 - 1700 Protein: 60 - 75 g Fluid: 1.5 - 1.8 liters  Skin: RLE small wound where CA area removed  Diet Order: Carb Control  EDUCATION NEEDS: -No education needs identified at this time   Intake/Output Summary (Last 24 hours) at 05/22/13 1337 Last data filed at 05/22/13 1327  Gross per 24 hour  Intake    540 ml  Output   1225 ml  Net   -685 ml    Last BM: PTA   Labs:   Recent Labs Lab 05/21/13 1323 05/21/13 1728  NA 132* 133*  K 4.2 4.2  CL 93* 94*  CO2 27 28  BUN 28* 27*  CREATININE 1.71* 1.78*  CALCIUM 9.6 9.1  MG  --  2.4  PHOS  --  3.6  GLUCOSE 305* 285*    CBG (last 3)   Recent Labs  05/22/13 0750  GLUCAP 248*    Scheduled Meds: . aspirin  81 mg Oral Daily  . cefTRIAXone (ROCEPHIN)  IV  1 g Intravenous Q24H  . diltiazem  180 mg Oral Daily  . feeding supplement (GLUCERNA SHAKE)  237 mL Oral BID BM  . furosemide  40 mg Oral Daily  . glipiZIDE  10 mg Oral BID AC  . hydrocerin  1 application Topical BID  . isosorbide mononitrate  30 mg Oral Daily  . metoprolol  50 mg Oral BID  . tamsulosin  0.4 mg Oral Daily  . Vitamin D (Ergocalciferol)  50,000 Units Oral Q7 days    Continuous Infusions: . sodium chloride 75 mL/hr at 05/22/13 0744    Past Medical History  Diagnosis Date  . Prostate cancer   . Hypothyroidism   . Hyperglycemia   . DMII (diabetes mellitus, type 2)   . Prostate cancer, primary, with metastasis from prostate to other site 12/06/2011  . Prostate cancer metastatic to multiple sites   . A-fib   . CKD (chronic kidney disease)     Past Surgical History  Procedure Laterality Date  . Melanoma removed from left leg   1971    x 14  . Left cataract extraction    . Prostate surgery    . Sinus exploration      Jarold Motto MS, RD, LDN Pager: [phone] After-hours pager: [phone]

## 2013-05-22 NOTE — Care Management Note (Signed)
Pt from SNF. CSW to follow. Cm to sign off.    Roxy Manns Lynise Porr,MSN,RN [phone]

## 2013-05-22 NOTE — Progress Notes (Signed)
Patient ID: [name], male   DOB: 02-06-1914, 79 y.o.   MRN: [id number]  [facility] PROGRESS NOTE  [name]:[id number] DOB: [birth year] DOA: 05/21/2013 PCP: Daniel Hoff, MD  Brief narrative: 79 year old male with past medical history of hypertension, hypothyroidism, BPH, DM, prostate cancer who presented to [facility] ED 05/21/2013 from nursing home with blood in urine started today on the day of this admission. Pt reported having associated abdominal discomfort but no nausea or vomiting. No diarrhea or constipation. NO fever or chills. No lightheadedness or loss of consciousness.   In ED, BP is on soft side, 144/70, HR 84 and T 99 F, O2 saturation is 94% on room air. CBC revealed leukocytosis of 26 and BMP revealed creatinine of 1.71. UA revealed large leukocyte esterases. CT abdomen revealed multiple findings related to prostate cancer; non-obstructing calculi within left kidney. Pt was started on rocephin in ED.   Assessment and Plan:  Principal Problem:  UTI  - continue rocephin day #2 - follow up urine culture results  Active Problems:  HTN (hypertension)  - continue Cardizem 180 mg daily, metoprolol 50 mg twice daily and Imdur 30 mg daily  - BP is reasonably well controlled  DM (diabetes mellitus)  - continue glipizide and add SSI  BPH (benign prostatic hyperplasia)  - continue flomax  Acute renal failure  - perhaps due to dehydration and Lasix - will hold Lasix today and continue gentle hydration  - follow up BMP in am  - baseline creatinine since last admission is 1.5  Leukocytosis, unspecified  - secondary to UTI  - ABX as noted above and repeat CBC in AM Prostate cancer, primary, with metastasis from prostate to other site  - per CT abdomen, improving multiple sites of prostate cancer   Consultants:  None  Procedures/Studies: Ct Abdomen Pelvis Wo Contrast   05/21/2013    1. No acute abnormalities involving the abdomen or pelvis.  2. Multiple  findings related to metastatic prostate cancer as noted previously, though imaging findings are improved since March, 2013. Please see above for details.  3. Distal descending and sigmoid colon diverticulosis without evidence of acute diverticulitis.  4. Non-obstructing calculi within the left side of the previously identified horseshoe kidney.  5. Stable marked pancreatic atrophy.    Antibiotics:  Rocephin 12/25 -->   Code Status: Full Family Communication: Pt at bedside Disposition Plan: likely SNF  HPI/Subjective: No events overnight.   Objective: Filed Vitals:   05/21/13 2215 05/21/13 2314 05/22/13 0537 05/22/13 1327  BP: 119/65 119/65 119/68 123/71  Pulse: 78  74 78  Temp: 97.8 F (36.6 C)  98 F (36.7 C) 98.1 F (36.7 C)  TempSrc: Oral  Oral Oral  Resp: 16  18 14   Height:      Weight:   85.541 kg (188 lb 9.3 oz)   SpO2: 98%  96% 95%    Intake/Output Summary (Last 24 hours) at 05/22/13 1357 Last data filed at 05/22/13 1327  Gross per 24 hour  Intake    540 ml  Output   1225 ml  Net   -685 ml    Exam:   General:  Pt is alert, follows commands appropriately, not in acute distress, HOH  Cardiovascular: Regular rate and rhythm, S1/S2, no murmurs, no rubs, no gallops  Respiratory: Clear to auscultation bilaterally, no wheezing, no crackles, no rhonchi  Abdomen: Soft, non tender, non distended, bowel sounds present, no guarding  Extremities: No edema, pulses DP  and PT palpable bilaterally  Data Reviewed: Basic Metabolic Panel:  Recent Labs Lab 05/21/13 1323 05/21/13 1728  NA 132* 133*  K 4.2 4.2  CL 93* 94*  CO2 27 28  GLUCOSE 305* 285*  BUN 28* 27*  CREATININE 1.71* 1.78*  CALCIUM 9.6 9.1  MG  --  2.4  PHOS  --  3.6   Liver Function Tests:  Recent Labs Lab 05/21/13 1728  AST 9  ALT 10  ALKPHOS 74  BILITOT 0.9  PROT 6.3  ALBUMIN 3.2*   CBC:  Recent Labs Lab 05/21/13 1323 05/21/13 1728  WBC 25.8* 26.4*  NEUTROABS  --  18.2*  HGB  13.9 13.2  HCT 41.8 39.4  MCV 91.5 91.0  PLT 188 169   CBG:  Recent Labs Lab 05/22/13 0750  GLUCAP 248*   Scheduled Meds: . aspirin  81 mg Oral Daily  . cefTRIAXone   IV  1 g Intravenous Q24H  . diltiazem  180 mg Oral Daily  . furosemide  40 mg Oral Daily  . glipiZIDE  10 mg Oral BID AC  . hydrocerin  1 application Topical BID  . isosorbide mononitrate  30 mg Oral Daily  . metoprolol  50 mg Oral BID  . tamsulosin  0.4 mg Oral Daily  . Vitamin D (Ergocalciferol)  50,000 Units Oral Q7 days   Continuous Infusions: . sodium chloride 75 mL/hr at 05/22/13 0744   Debbora Presto, MD  [facility] Pager [phone]  If 7PM-7AM, please contact night-coverage [email or web address] Password [email or web address] 05/22/2013, 1:57 PM   LOS: 1 day

## 2013-05-22 NOTE — Care Management Note (Signed)
   CARE MANAGEMENT NOTE 05/22/2013  Patient:  [name], [name]   Account Number:  [id number]  Date Initiated:  05/22/2013  Documentation initiated by:  Odilia Damico  Subjective/Objective Assessment:   79 yo male admitted with UTI.PCP: Sissy Hoff, MD     Action/Plan:   SNF   Anticipated DC Date:     Anticipated DC Plan:  SKILLED NURSING FACILITY  In-house referral  Clinical Social Worker      DC Planning Services  CM consult      Choice offered to / List presented to:  NA   DME arranged  NA      DME agency  NA     HH arranged  NA      HH agency  NA   Status of service:  Completed, signed off Medicare Important Message given?   (If response is "NO", the following Medicare IM given date fields will be blank) Date Medicare IM given:   Date Additional Medicare IM given:    Discharge Disposition:    Per UR Regulation:  Reviewed for med. necessity/level of care/duration of stay  If discussed at Long Length of Stay Meetings, dates discussed:    Comments:  Sharmon Leyden, RN Registered Nurse Signed CASE MANAGEMENT Care Management Note Service date: 05/22/2013 10:32 AM Pt from SNF. CSW to follow. Cm to sign off.

## 2013-05-22 NOTE — Evaluation (Signed)
Occupational Therapy Evaluation Patient Details Name: [name] MRN: [id number] DOB: Aug 17, 1913 Today's Date: 05/22/2013 Time: 5284-1324 OT Time Calculation (min): 19 min  OT Assessment / Plan / Recommendation History of present illness pt was admitted for hematuria and abdominal pain   Clinical Impression   This 79 year old man was admitted from [facility] with the above.  He will benefit from skilled OT to increase safety and independence with adls.  Pt reports he was independent with adls prior to admission; he is now overall min A for balance.  Goals in acute are for supervision to min guard.    OT Assessment  Patient needs continued OT Services    Follow Up Recommendations  SNF    Barriers to Discharge      Equipment Recommendations  3 in 1 bedside comode    Recommendations for Other Services    Frequency  Min 2X/week    Precautions / Restrictions Precautions Precautions: Fall Restrictions Weight Bearing Restrictions: No   Pertinent Vitals/Pain No pain reported    ADL  Grooming: Set up;Teeth care Where Assessed - Grooming: Unsupported sitting Upper Body Bathing: Set up Where Assessed - Upper Body Bathing: Unsupported sitting Lower Body Bathing: Minimal assistance Where Assessed - Lower Body Bathing: Unsupported sit to stand Upper Body Dressing: Minimal assistance (iv) Where Assessed - Upper Body Dressing: Unsupported sitting Lower Body Dressing: Minimal assistance Where Assessed - Lower Body Dressing: Unsupported sit to stand Transfers/Ambulation Related to ADLs: hand held assist, to stand and sidestep up bed ADL Comments: Min A for adls for balance sit to stand:  pt held to bedrail    OT Diagnosis: Generalized weakness  OT Problem List: Decreased strength;Decreased activity tolerance;Impaired balance (sitting and/or standing) OT Treatment Interventions: Self-care/ADL training;DME and/or AE instruction;Therapeutic activities;Patient/family  education;Balance training   OT Goals(Current goals can be found in the care plan section) Acute Rehab OT Goals Patient Stated Goal: pt preached day prior to admission; hopes to get back to this OT Goal Formulation: With patient Time For Goal Achievement: 06/06/12 Potential to Achieve Goals: Good ADL Goals Pt Will Perform Grooming: with supervision;standing Pt Will Perform Lower Body Bathing: with supervision;sit to/from stand Pt Will Perform Lower Body Dressing: with supervision;sit to/from stand Pt Will Transfer to Toilet: with min guard assist;ambulating;bedside commode Pt Will Perform Toileting - Clothing Manipulation and hygiene: with supervision;sit to/from stand  Visit Information  Last OT Received On: 05/22/13 Assistance Needed: +2 (for equipment) History of Present Illness: pt was admitted for hematuria and abdominal pain       Prior Functioning     Home Living Family/patient expects to be discharged to:: Skilled nursing facility Prior Function Comments: pt states he does all adls and ambulates without device Communication Communication: HOH Dominant Hand: Right         Vision/Perception     Cognition  Cognition Arousal/Alertness: Awake/alert Behavior During Therapy: WFL for tasks assessed/performed Overall Cognitive Status: Within Functional Limits for tasks assessed    Extremity/Trunk Assessment Upper Extremity Assessment Upper Extremity Assessment: Overall WFL for tasks assessed     Mobility Bed Mobility Supine to Sit: 4: Min assist Sit to Supine: 4: Min assist Details for Bed Mobility Assistance: assist for trunk to sit up and legs to lie down Transfers Sit to Stand: 4: Min assist;From bed;With upper extremity assist Details for Transfer Assistance: assist to rise and steady     Exercise     Balance Balance Balance Assessed: Yes Static Standing Balance Static Standing -  Balance Support: Right upper extremity supported Static Standing -  Level of Assistance: 5: Stand by assistance Static Standing - Comment/# of Minutes: 1 minute   End of Session OT - End of Session Activity Tolerance: Patient tolerated treatment well Patient left: in bed;with call bell/phone within reach;with bed alarm set  GO     Rowena Moilanen 05/22/2013, 3:25 PM Marica Otter, OTR/L [phone] 05/22/2013

## 2013-05-22 NOTE — Progress Notes (Signed)
Inpatient Diabetes Program Recommendations  AACE/ADA: New Consensus Statement on Inpatient Glycemic Control (2013)  Target Ranges:  Prepandial:   less than 140 mg/dL      Peak postprandial:   less than 180 mg/dL (1-2 hours)      Critically ill patients:  140 - 180 mg/dL   AACE/ADA do not recommend using oral sulfonylureas and other oral meds while in the hospital:  Inpatient Diabetes Program Recommendations Oral Agents: Glipizide ordered and given without cbg order.  Noted sensitive correction to start this evening with supper.  Please do not use oral sulfonylureas while in the hospital, as pt may not eat  as expected and hypoglycelmia may result.  Thank you, Lenor Coffin, RN, CNS, Diabetes Coordinator [phone])

## 2013-05-22 NOTE — Evaluation (Signed)
Physical Therapy Evaluation Patient Details Name: [name] MRN: [id number] DOB: 12-Oct-1913 Today's Date: 05/22/2013 Time: 8295-6213 PT Time Calculation (min): 22 min  PT Assessment / Plan / Recommendation History of Present Illness    79 year old male with past medical history of hypertension, hypothyroidism, BPH, DM, prostate cancer who presented to [facility] ED 05/21/2013 from nursing home with blood in urine started today on the day of this admission. Pt reported having associated abdominal discomfort but no nausea or vomiting   Clinical Impression  Pt presents with limited endurance and requiring min assist for safety and stability with OOB activity.  [name] for follow up rehab    PT Assessment       Follow Up Recommendations  SNF    Does the patient have the potential to tolerate intense rehabilitation      Barriers to Discharge        Equipment Recommendations  None recommended by PT    Recommendations for Other Services     Frequency      Precautions / Restrictions Precautions Precautions: Fall Restrictions Weight Bearing Restrictions: No   Pertinent Vitals/Pain No specific c/o pain       Mobility  Bed Mobility Bed Mobility: Sit to Supine Sit to Supine: 4: Min assist Transfers Transfers: Sit to Stand;Stand to Sit Sit to Stand: 4: Min assist;From chair/3-in-1;With upper extremity assist Stand to Sit: 4: Min assist;To bed;To chair/3-in-1;With upper extremity assist Details for Transfer Assistance: cues for use of UEs and assist to gain balance with standing and to control descent on sitting Ambulation/Gait Ambulation/Gait Assistance: 4: Min assist Ambulation Distance (Feet): 78 Feet (and 5) Assistive device: None (Pt states he has little use for RW) Ambulation/Gait Assistance Details: min assist for balance; cues for stride length, posture, pacing Gait Pattern: Step-through pattern;Decreased step length - right;Decreased  step length - left;Shuffle;Antalgic;Wide base of support Gait velocity: decr    Exercises     PT Diagnosis:    PT Problem List:   PT Treatment Interventions:       PT Goals(Current goals can be found in the care plan section) Acute Rehab PT Goals Patient Stated Goal: Back to bed PT Goal Formulation: With patient Time For Goal Achievement: 06/05/13 Potential to Achieve Goals: Good  Visit Information  Last PT Received On: 05/22/13 Assistance Needed: +2 (2nd person for equipment only)       Prior Functioning  Home Living Family/patient expects to be discharged to:: Skilled nursing facility Prior Function Level of Independence: Needs assistance Comments: Pt states ambulated on own without assistive device Communication Communication: HOH Dominant Hand: Right    Cognition  Cognition Arousal/Alertness: Awake/alert Behavior During Therapy: WFL for tasks assessed/performed Overall Cognitive Status: Within Functional Limits for tasks assessed    Extremity/Trunk Assessment Upper Extremity Assessment Upper Extremity Assessment: Overall WFL for tasks assessed Lower Extremity Assessment Lower Extremity Assessment: Overall WFL for tasks assessed   Balance    End of Session PT - End of Session Equipment Utilized During Treatment: Gait belt Activity Tolerance: Patient limited by fatigue Patient left: in bed  GP     [name] 05/22/2013, 12:30 PM

## 2013-05-22 NOTE — Progress Notes (Addendum)
Clinical Social Work Department CLINICAL SOCIAL WORK PLACEMENT NOTE 05/22/2013  Patient:  NASIIR, MONTS  Account Number:  [id number] Admit date:  05/21/2013  Clinical Social Worker:  Unk Lightning, LCSW  Date/time:  05/22/2013 03:15 PM  Clinical Social Work is seeking post-discharge placement for this patient at the following level of care:   SKILLED NURSING   (*CSW will update this form in Epic as items are completed)   05/22/2013  Patient/family provided with [facility] Department of Clinical Social Work's list of facilities offering this level of care within the geographic area requested by the patient (or if unable, by the patient's family).  05/22/2013  Patient/family informed of their freedom to choose among providers that offer the needed level of care, that participate in Medicare, Medicaid or managed care program needed by the patient, have an available bed and are willing to accept the patient.  05/22/2013  Patient/family informed of [facility]' ownership interest in [facility], as well as of the fact that they are under no obligation to receive care at this facility.  PASARR submitted to EDS on existing # PASARR number received from EDS on   FL2 transmitted to all facilities in geographic area requested by pt/family on   FL2 transmitted to all facilities within larger geographic area on   Patient informed that his/her managed care company has contracts with or will negotiate with  certain facilities, including the following:     Patient/family informed of bed offers received:   Patient chooses bed at [facility] Physician recommends and patient chooses bed at    Patient to be transferred to [facility] on  05/25/13 Patient to be transferred to facility by [facility]  The following physician request were entered in Epic:   Additional Comments: 05/25/13-Pt to return to ALF. No SNF needs at this time as patient and dtr refused.

## 2013-05-22 NOTE — Progress Notes (Signed)
Clinical Social Work Department BRIEF PSYCHOSOCIAL ASSESSMENT 05/22/2013  Patient:  [name], [name]     Account Number:  [id number]     Admit date:  05/21/2013  Clinical Social Worker:  Dennison Bulla  Date/Time:  05/22/2013 03:00 PM  Referred by:  Physician  Date Referred:  05/22/2013 Referred for  SNF Placement   Other Referral:   Interview type:  Patient Other interview type:    PSYCHOSOCIAL DATA Living Status:  FACILITY Admitted from facility:   Level of care:  Assisted Living Primary support name:  [name] Primary support relationship to patient:  CHILD, ADULT Degree of support available:   Strong    CURRENT CONCERNS Current Concerns  Post-Acute Placement   Other Concerns:    SOCIAL WORK ASSESSMENT / PLAN CSW received referral to assist with DC planning. Per chart review, patient is from ALF but PT is recommending SNF placement. CSW met with patient at bedside and introduced myself.    Patient reports that he lives a ALF but is somewhat confused about time. Patient is unable to fully participate in assessment. CSW called and spoke with patient's HCPOA, [name], who is also patient's dtr. Dtr reports that patient has lived in ALF for over a year but feels that a higher level of care is needed. Dtr reports that she is HCPOA but brother is durable POA. Dtr is agreeable to SNF but reports that brother has not been agreeable to SNF in the past because of financial concerns. Dtr is unwilling to decide between SNF vs ALF until speaking with brother. CSW explained that weekend CSW will follow up with dtr in order to get decision re: DC planning. Dtr used to work at [employer] and reports that she has had patient set up to go to SNF several times but brother refuses to pay.    CSW completed FL2 and will continue to follow.   Assessment/plan status:  Psychosocial Support/Ongoing Assessment of Needs Other assessment/ plan:   Information/referral to community resources:   ALF vs SNF     PATIENT'S/FAMILY'S RESPONSE TO PLAN OF CARE: Patient confused and unable to make DC plans. Patient's dtr feels torn about situation and reports stress with her and brother having different opinions. Dtr aware of options and agreeable to have a plan and is aware that weekend CSW will follow up with decision. Dtr has CSW contact information and thanked CSW for time.       [place], [place] [phone]

## 2013-05-23 LAB — BASIC METABOLIC PANEL
CO2: 28 mEq/L (ref 19–32)
Calcium: 8.8 mg/dL (ref 8.4–10.5)
Chloride: 100 mEq/L (ref 96–112)
Creatinine, Ser: 1.78 mg/dL — ABNORMAL HIGH (ref 0.50–1.35)
GFR calc Af Amer: 35 mL/min — ABNORMAL LOW (ref >90)
Sodium: 135 mEq/L (ref 135–145)

## 2013-05-23 LAB — URINE CULTURE

## 2013-05-23 LAB — CBC
MCH: 30 pg (ref 26.0–34.0)
MCHC: 32.7 g/dL (ref 30.0–36.0)
Platelets: 151 10*3/uL (ref 150–400)
RBC: 3.93 MIL/uL — ABNORMAL LOW (ref 4.22–5.81)
RDW: 14.6 % (ref 11.5–15.5)
WBC: 16.4 10*3/uL — ABNORMAL HIGH (ref 4.0–10.5)

## 2013-05-23 LAB — GLUCOSE, CAPILLARY
Glucose-Capillary: 152 mg/dL — ABNORMAL HIGH (ref 70–99)
Glucose-Capillary: 218 mg/dL — ABNORMAL HIGH (ref 70–99)

## 2013-05-23 NOTE — Progress Notes (Signed)
T/c from Pt's son asking that CSW not contact Pt's daughter, as she tends to muddy the waters.  Pt's son adamant that Pt is alert and oriented and that he is able to make his own decisions.  Pt's son doesn't feel that Pt needs SNF and stated, "He will die if he goes to a nursing home and is hooked up to an IV."  Pt's son asking that CSW meet with Pt to discuss d/c plans.  Per MD, psych MD to assess Pt for capacity.  Met with Pt and Pt's daughter-in-law.  Pt's daughter-in-law had a great understanding of the situation and was able to convey to Pt that the PT and MD feel that SNF is necessary upon d/c due to Pt's weakness.  After SNF and insurance coverage at SNF was discussed with Pt by CSW and daughter-in-law, Pt seemed agreeable to considering SNF.  Pt is wanting to go to an ALF in [place] upon d/c from SNF; Pt's daughter-in-law to look into this.  Pt understands the need for a psych eval to assess for capacity and is willing to cooperate with that evaluation.  Pt stated that he prefers his son and daughter-in-law to handle his affairs and would prefer that we not contact his daughter unless necessary.  CSW thanked Pt and his family for their time.  Daughter-in-law voiced that she will relay all information discussed today with her husband (Pt's son).  Providence Crosby, LCSWA Clinical Social Work [phone]

## 2013-05-23 NOTE — Progress Notes (Signed)
Patient ID: [name], male   DOB: 06-24-1913, 79 y.o.   MRN: [id number] [facility] PROGRESS NOTE  DRACEN REIGLE JXB:[id number] DOB: 1913-06-21 DOA: 05/21/2013 PCP: Sissy Hoff, MD   Brief narrative:  79 year old male with past medical history of hypertension, hypothyroidism, BPH, DM, prostate cancer who presented to [facility] ED 05/21/2013 from nursing home with blood in urine started today on the day of this admission. Pt reported having associated abdominal discomfort but no nausea or vomiting. No diarrhea or constipation. NO fever or chills. No lightheadedness or loss of consciousness.   In ED, BP is on soft side, 144/70, HR 84 and T 99 F, O2 saturation is 94% on room air. CBC revealed leukocytosis of 26 and BMP revealed creatinine of 1.71. UA revealed large leukocyte esterases. CT abdomen revealed multiple findings related to prostate cancer; non-obstructing calculi within left kidney. Pt was started on rocephin in ED.   Assessment and Plan:  Principal Problem:  UTI  - continue rocephin day #3 - follow up urine culture results, final report is pending but pre lim report with gram negative rods   Active Problems:  HTN (hypertension)  - continue Cardizem 180 mg daily, metoprolol 50 mg twice daily and Imdur 30 mg daily  - BP is reasonably well controlled  DM (diabetes mellitus)  - continue glipizide and add SSI  - appreciate diabetic educator assistance  BPH (benign prostatic hyperplasia)  - continue flomax  Acute renal failure  - perhaps due to dehydration and Lasix  - Lasix on hold today as pt did receive one dose of Lasix yesterday - continue gentle hydration  - follow up BMP in am  - baseline creatinine since last admission is 1.5  Leukocytosis, unspecified  - secondary to UTI, WBC trending down   - ABX as noted above and repeat CBC in AM  Prostate cancer, primary, with metastasis from prostate to other site  - per CT abdomen, improving multiple sites of prostate  cancer   Consultants:  None Procedures/Studies:  Ct Abdomen Pelvis Wo Contrast 05/21/2013  1. No acute abnormalities involving the abdomen or pelvis.  2. Multiple findings related to metastatic prostate cancer as noted previously, though imaging findings are improved since March, 2013. Please see above for details.  3. Distal descending and sigmoid colon diverticulosis without evidence of acute diverticulitis.  4. Non-obstructing calculi within the left side of the previously identified horseshoe kidney.  5. Stable marked pancreatic atrophy.  Antibiotics:  Rocephin 12/25 -->   Code Status: Full  Family Communication: Pt at bedside  Disposition Plan: likely SNF   HPI/Subjective: No events overnight.   Objective: Filed Vitals:   05/22/13 0537 05/22/13 1327 05/22/13 2126 05/23/13 0626  BP: 119/68 123/71 126/54 127/67  Pulse: 74 78 79 73  Temp: 98 F (36.7 C) 98.1 F (36.7 C) 99.5 F (37.5 C) 98.2 F (36.8 C)  TempSrc: Oral Oral Oral Oral  Resp: 18 14 16 18   Height:      Weight: 85.541 kg (188 lb 9.3 oz)   85.5 kg (188 lb 7.9 oz)  SpO2: 96% 95% 97% 98%    Intake/Output Summary (Last 24 hours) at 05/23/13 0742 Last data filed at 05/23/13 1308  Gross per 24 hour  Intake 2910.42 ml  Output    275 ml  Net 2635.42 ml    Exam:   General:  Pt is alert, follows commands appropriately, not in acute distress  Cardiovascular: Regular rate and rhythm, S1/S2, no  murmurs, no rubs, no gallops  Respiratory: Clear to auscultation bilaterally, no wheezing, no crackles, no rhonchi  Abdomen: Soft, non tender, non distended, bowel sounds present, no guarding  Extremities: No edema, pulses DP and PT palpable bilaterally  Data Reviewed: Basic Metabolic Panel:  Recent Labs Lab 05/21/13 1323 05/21/13 1728 05/23/13 0542  NA 132* 133* 135  K 4.2 4.2 4.2  CL 93* 94* 100  CO2 27 28 28   GLUCOSE 305* 285* 185*  BUN 28* 27* 35*  CREATININE 1.71* 1.78* 1.78*  CALCIUM 9.6 9.1 8.8   MG  --  2.4  --   PHOS  --  3.6  --    Liver Function Tests:  Recent Labs Lab 05/21/13 1728  AST 9  ALT 10  ALKPHOS 74  BILITOT 0.9  PROT 6.3  ALBUMIN 3.2*   CBC:  Recent Labs Lab 05/21/13 1323 05/21/13 1728 05/23/13 0542  WBC 25.8* 26.4* 16.4*  NEUTROABS  --  18.2*  --   HGB 13.9 13.2 11.8*  HCT 41.8 39.4 36.1*  MCV 91.5 91.0 91.9  PLT 188 169 151   CBG:  Recent Labs Lab 05/22/13 0750 05/22/13 2128  GLUCAP 248* 201*    Recent Results (from the past 240 hour(s))  URINE CULTURE     Status: None   Collection Time    05/21/13 12:50 PM      Result Value Range Status   Specimen Description URINE, CLEAN CATCH   Final   Special Requests NONE   Final   Culture  Setup Time     Final   Value: 05/21/2013 22:30     Performed at [facility] Count     Final   Value: >=100,000 COLONIES/ML     Performed at [facility]   Culture     Final   Value: GRAM NEGATIVE RODS     Performed at [facility]   Report Status PENDING   Incomplete     Scheduled Meds: . aspirin  81 mg Oral Daily  . cefTRIAXone (ROCEPHIN)  IV  1 g Intravenous Q24H  . diltiazem  180 mg Oral Daily  . feeding supplement (GLUCERNA SHAKE)  237 mL Oral BID BM  . glipiZIDE  10 mg Oral BID AC  . hydrocerin  1 application Topical BID  . insulin aspart  0-9 Units Subcutaneous TID WC  . isosorbide mononitrate  30 mg Oral Daily  . metoprolol  50 mg Oral BID  . tamsulosin  0.4 mg Oral Daily  . Vitamin D (Ergocalciferol)  50,000 Units Oral Q7 days   Continuous Infusions: . sodium chloride 50 mL/hr at 05/22/13 2331   Debbora Presto, MD  [facility] Pager [phone]  If 7PM-7AM, please contact night-coverage [email or web address] Password [facility] 05/23/2013, 7:42 AM   LOS: 2 days

## 2013-05-24 LAB — BASIC METABOLIC PANEL
CO2: 25 mEq/L (ref 19–32)
Calcium: 8.7 mg/dL (ref 8.4–10.5)
Creatinine, Ser: 1.48 mg/dL — ABNORMAL HIGH (ref 0.50–1.35)
GFR calc non Af Amer: 38 mL/min — ABNORMAL LOW (ref >90)
Glucose, Bld: 215 mg/dL — ABNORMAL HIGH (ref 70–99)
Sodium: 137 mEq/L (ref 135–145)

## 2013-05-24 LAB — GLUCOSE, CAPILLARY: Glucose-Capillary: 183 mg/dL — ABNORMAL HIGH (ref 70–99)

## 2013-05-24 LAB — CBC
HCT: 35.4 % — ABNORMAL LOW (ref 39.0–52.0)
Hemoglobin: 11.6 g/dL — ABNORMAL LOW (ref 13.0–17.0)
MCH: 30.1 pg (ref 26.0–34.0)
MCHC: 32.8 g/dL (ref 30.0–36.0)
MCV: 91.9 fL (ref 78.0–100.0)
Platelets: 172 10*3/uL (ref 150–400)
RBC: 3.85 MIL/uL — ABNORMAL LOW (ref 4.22–5.81)
RDW: 14.5 % (ref 11.5–15.5)

## 2013-05-24 MED ORDER — CIPROFLOXACIN HCL 500 MG PO TABS
500.0000 mg | ORAL_TABLET | Freq: Two times a day (BID) | ORAL | Status: DC
  Administered 2013-05-24 – 2013-05-25 (×2): 500 mg via ORAL
  Filled 2013-05-24 (×4): qty 1

## 2013-05-24 NOTE — Progress Notes (Signed)
Pt lost IV access. MD notified. Advised MD will switch patient to PO meds.  Will follow up. Cayleigh Paull, Doran Durand RN

## 2013-05-24 NOTE — Progress Notes (Addendum)
Message from Pt's son.  Spoke with Pt's son re: the psych eval ordered by MD.  Pt's son very upset that the psych eval has been ordered, as, per Pt's son, "every nurse that I've spoken with will attest to my father's capacity."  Pt's son was angry that CSW didn't know why the weekday CSW didn't feel that Pt was able to participate in the assessment and stated, several times, that he is the POA and that his sister has no say in any of Pt's affairs until he is deemed to lack capacity.  He stated to CSW several times that there are many things going on "behind the scenes" and that this is bigger than CSW "[employer] and [facility]."  CSW stated that, in light of the weekday CSW's feelings that Pt was not able to participate in the assessment on Friday, Pt's daughter's concerns about Pt's ability to return to the ALF and MD's statement to CSW yesterday that Pt may not fully understand the situation, MD felt the need to order a psych eval to rule on capacity.  CSW explained that the psych eval will eliminate all speculation about Pt's capacity and that Pt and his family, should Pt be deemed to have capacity, will be able to move on as Pt sees fit.  Pt's son wanting the evaluation to be taped.  CSW will notify MD.  CSW explained that Pt will need to consent to this, if MD says this is possible.  Providence Crosby, LCSWA Clinical Social Work [phone]

## 2013-05-24 NOTE — Progress Notes (Signed)
Notified MD of Pt's son's request to have the psych eval audio taped.  Providence Crosby, LCSWA Clinical Social Work [phone]

## 2013-05-24 NOTE — Progress Notes (Signed)
Patient ID: [name], male   DOB: 06/28/1913, 79 y.o.   MRN: [id number]  [facility] PROGRESS NOTE  [name]:[id number] DOB: 1913/10/28 DOA: 05/21/2013 PCP: Sissy Hoff, MD  Brief narrative:  79 year old male with past medical history of hypertension, hypothyroidism, BPH, DM, prostate cancer who presented to [facility] ED 05/21/2013 from nursing home with blood in urine started today on the day of this admission. Pt reported having associated abdominal discomfort but no nausea or vomiting. No diarrhea or constipation. NO fever or chills. No lightheadedness or loss of consciousness.  In ED, BP is on soft side, 144/70, HR 84 and T 99 F, O2 saturation is 94% on room air. CBC revealed leukocytosis of 26 and BMP revealed creatinine of 1.71. UA revealed large leukocyte esterases. CT abdomen revealed multiple findings related to prostate cancer; non-obstructing calculi within left kidney. Pt was started on rocephin in ED.   Assessment and Plan:  Principal Problem:  UTI  - continue rocephin day #4 - final report with E. Coli pan sensitive Active Problems:  HTN (hypertension)  - continue Cardizem 180 mg daily, metoprolol 50 mg twice daily and Imdur 30 mg daily  - BP is reasonably well controlled  DM (diabetes mellitus)  - continue glipizide and add SSI  - appreciate diabetic educator assistance  BPH (benign prostatic hyperplasia)  - continue flomax  Acute renal failure  - perhaps due to dehydration and Lasix  - Lasix on hold today as pt did receive one dose of Lasix yesterday  - follow up BMP in am  - baseline creatinine since last admission is 1.5  Leukocytosis, unspecified  - secondary to UTI, WBC trending down  - ABX as noted above and repeat CBC in AM  Prostate cancer, primary, with metastasis from prostate to other site  - per CT abdomen, improving multiple sites of prostate cancer   Consultants:  None Procedures/Studies:  Ct Abdomen Pelvis Wo Contrast  05/21/2013  1. No acute abnormalities involving the abdomen or pelvis.  2. Multiple findings related to metastatic prostate cancer as noted previously, though imaging findings are improved since March, 2013. Please see above for details.  3. Distal descending and sigmoid colon diverticulosis without evidence of acute diverticulitis.  4. Non-obstructing calculi within the left side of the previously identified horseshoe kidney.  5. Stable marked pancreatic atrophy.  Antibiotics:  Rocephin 12/25 -->  Code Status: Full  Family Communication: Pt at bedside  Disposition Plan: home in AM  HPI/Subjective: No events overnight.   Objective: Filed Vitals:   05/22/13 2126 05/23/13 0626 05/23/13 1459 05/24/13 0526  BP: 126/54 127/67 128/78 141/66  Pulse: 79 73 70 51  Temp: 99.5 F (37.5 C) 98.2 F (36.8 C) 98.2 F (36.8 C) 98.6 F (37 C)  TempSrc: Oral Oral Axillary Oral  Resp: 16 18 16 16   Height:      Weight:  85.5 kg (188 lb 7.9 oz)    SpO2: 97% 98% 97% 95%    Intake/Output Summary (Last 24 hours) at 05/24/13 0729 Last data filed at 05/24/13 9562  Gross per 24 hour  Intake 2327.5 ml  Output   1850 ml  Net  477.5 ml    Exam:   General:  Pt is alert, follows commands appropriately, not in acute distress  Cardiovascular: Regular rate and rhythm, S1/S2, no murmurs, no rubs, no gallops  Respiratory: Clear to auscultation bilaterally, no wheezing, no crackles, no rhonchi  Abdomen: Soft, non tender, non distended,  bowel sounds present, no guarding  Extremities: No edema, pulses DP and PT palpable bilaterally  Data Reviewed: Basic Metabolic Panel:  Recent Labs Lab 05/21/13 1323 05/21/13 1728 05/23/13 0542 05/24/13 0545  NA 132* 133* 135 137  K 4.2 4.2 4.2 3.7  CL 93* 94* 100 102  CO2 27 28 28 25   GLUCOSE 305* 285* 185* 215*  BUN 28* 27* 35* 35*  CREATININE 1.71* 1.78* 1.78* 1.48*  CALCIUM 9.6 9.1 8.8 8.7  MG  --  2.4  --   --   PHOS  --  3.6  --   --    Liver  Function Tests:  Recent Labs Lab 05/21/13 1728  AST 9  ALT 10  ALKPHOS 74  BILITOT 0.9  PROT 6.3  ALBUMIN 3.2*   CBC:  Recent Labs Lab 05/21/13 1323 05/21/13 1728 05/23/13 0542 05/24/13 0545  WBC 25.8* 26.4* 16.4* 15.1*  NEUTROABS  --  18.2*  --   --   HGB 13.9 13.2 11.8* 11.6*  HCT 41.8 39.4 36.1* 35.4*  MCV 91.5 91.0 91.9 91.9  PLT 188 169 151 172  CBG:  Recent Labs Lab 05/22/13 2128 05/23/13 0724 05/23/13 1104 05/23/13 1653 05/23/13 2111  GLUCAP 201* 152* 218* 253* 233*    Recent Results (from the past 240 hour(s))  URINE CULTURE     Status: None   Collection Time    05/21/13 12:50 PM      Result Value Range Status   Specimen Description URINE, CLEAN CATCH   Final   Special Requests NONE   Final   Culture  Setup Time     Final   Value: 05/21/2013 22:30     Performed at [facility] Count     Final   Value: >=100,000 COLONIES/ML     Performed at [facility]   Culture     Final   Value: ESCHERICHIA COLI     Performed at [facility]   Report Status 05/23/2013 FINAL   Final   Organism ID, Bacteria ESCHERICHIA COLI   Final     Scheduled Meds: . aspirin  81 mg Oral Daily  . cefTRIAXone (ROCEPHIN)  IV  1 g Intravenous Q24H  . diltiazem  180 mg Oral Daily  . feeding supplement (GLUCERNA SHAKE)  237 mL Oral BID BM  . glipiZIDE  10 mg Oral BID AC  . hydrocerin  1 application Topical BID  . insulin aspart  0-9 Units Subcutaneous TID WC  . isosorbide mononitrate  30 mg Oral Daily  . metoprolol  50 mg Oral BID  . tamsulosin  0.4 mg Oral Daily  . Vitamin D (Ergocalciferol)  50,000 Units Oral Q7 days   Continuous Infusions: . sodium chloride Stopped (05/24/13 0626)   Debbora Presto, MD  [facility] Pager [phone]  If 7PM-7AM, please contact night-coverage [email or web address] Password [email or web address] 05/24/2013, 7:29 AM   LOS: 3 days

## 2013-05-25 LAB — CBC
Hemoglobin: 11.6 g/dL — ABNORMAL LOW (ref 13.0–17.0)
MCH: 30.2 pg (ref 26.0–34.0)
RBC: 3.84 MIL/uL — ABNORMAL LOW (ref 4.22–5.81)
WBC: 13.4 10*3/uL — ABNORMAL HIGH (ref 4.0–10.5)

## 2013-05-25 LAB — BASIC METABOLIC PANEL
CO2: 26 mEq/L (ref 19–32)
Calcium: 8.6 mg/dL (ref 8.4–10.5)
Chloride: 105 mEq/L (ref 96–112)
Glucose, Bld: 178 mg/dL — ABNORMAL HIGH (ref 70–99)
Potassium: 3.8 mEq/L (ref 3.5–5.1)
Sodium: 137 mEq/L (ref 135–145)

## 2013-05-25 LAB — URINE CULTURE

## 2013-05-25 LAB — GLUCOSE, CAPILLARY
Glucose-Capillary: 228 mg/dL — ABNORMAL HIGH (ref 70–99)
Glucose-Capillary: 211 mg/dL — ABNORMAL HIGH (ref 70–99)

## 2013-05-25 MED ORDER — CIPROFLOXACIN HCL 500 MG PO TABS: 500.0000 mg | ORAL_TABLET | Freq: Two times a day (BID) | ORAL | Status: DC

## 2013-05-25 MED ORDER — LORAZEPAM 0.5 MG PO TABS: 0.5000 mg | ORAL_TABLET | Freq: Every evening | ORAL | Status: DC | PRN

## 2013-05-25 NOTE — Discharge Summary (Signed)
Physician Discharge Summary  [name] AVW:[id number] DOB: 05/07/1914 DOA: 05/21/2013  PCP: Sissy Hoff, MD  Admit date: 05/21/2013 Discharge date: 05/25/2013  Recommendations for Outpatient Follow-up:  1. Pt will need to follow up with PCP in 2-3 weeks post discharge 2. Please obtain BMP to evaluate electrolytes and kidney function 3. Please also check CBC to evaluate Hg and Hct levels 4. Please note that pt was discharged on Ciprofloxacin to complete therapy for 5 more days post discharge   Discharge Diagnoses: UTI Principal Problem:   UTI (lower urinary tract infection) Active Problems:   HTN (hypertension)   DM (diabetes mellitus)   BPH (benign prostatic hyperplasia)   Acute renal failure   Prostate cancer, primary, with metastasis from prostate to other site   Leukocytosis  Discharge Condition: Stable  Diet recommendation: Heart healthy diet discussed in details   Brief narrative:  79 year old male with past medical history of hypertension, hypothyroidism, BPH, DM, prostate cancer who presented to [facility] ED 05/21/2013 from nursing home with blood in urine started today on the day of this admission. Pt reported having associated abdominal discomfort but no nausea or vomiting. No diarrhea or constipation. NO fever or chills. No lightheadedness or loss of consciousness.   In ED, BP is on soft side, 144/70, HR 84 and T 99 F, O2 saturation is 94% on room air. CBC revealed leukocytosis of 26 and BMP revealed creatinine of 1.71. UA revealed large leukocyte esterases. CT abdomen revealed multiple findings related to prostate cancer; non-obstructing calculi within left kidney. Pt was started on rocephin in ED.   Assessment and Plan:  Principal Problem:  UTI  - continue rocephin day #5 and transition to oral Ciprofloxacin upon discharge for 5 more days   - final report with E. Coli pan sensitive  Active Problems:  HTN (hypertension)  - continue Cardizem 180 mg daily,  metoprolol 50 mg twice daily and Imdur 30 mg daily  - BP is reasonably well controlled  DM (diabetes mellitus)  - continue glipizide  - appreciate diabetic educator assistance  BPH (benign prostatic hyperplasia)  - continue flomax  Acute renal failure  - perhaps due to dehydration and Lasix  - Lasix was on hold as pt was dehydrated and had elevated Cr - will resume upon discharge  - baseline creatinine since last admission is 1.5  Leukocytosis, unspecified  - secondary to UTI, WBC trending down  - ABX as noted above  Prostate cancer, primary, with metastasis from prostate to other site  - per CT abdomen, improving multiple sites of prostate cancer   Consultants:  None Procedures/Studies:  Ct Abdomen Pelvis Wo Contrast 05/21/2013  1. No acute abnormalities involving the abdomen or pelvis.  2. Multiple findings related to metastatic prostate cancer as noted previously, though imaging findings are improved since March, 2013. Please see above for details.  3. Distal descending and sigmoid colon diverticulosis without evidence of acute diverticulitis.  4. Non-obstructing calculi within the left side of the previously identified horseshoe kidney.  5. Stable marked pancreatic atrophy.  Antibiotics:  Rocephin 12/25 --> 12/28 Ciprofloxacin 12/28 --> 5 more days post discharge  Code Status: Full  Family Communication: Pt at bedside, son over the phone    Discharge Exam: Filed Vitals:   05/24/13 2112  BP: 116/54  Pulse: 78  Temp: 98.5 F (36.9 C)  Resp: 19   Filed Vitals:   05/23/13 1459 05/24/13 0526 05/24/13 1400 05/24/13 2112  BP: 128/78 141/66 115/69 116/54  Pulse: 70 51 57 78  Temp: 98.2 F (36.8 C) 98.6 F (37 C) 97.9 F (36.6 C) 98.5 F (36.9 C)  TempSrc: Axillary Oral Oral Oral  Resp: 16 16 20 19   Height:      Weight:      SpO2: 97% 95% 96% 98%    General: Pt is alert, follows commands appropriately, not in acute distress Cardiovascular: Regular rate and  rhythm, S1/S2 +, no murmurs, no rubs, no gallops Respiratory: Clear to auscultation bilaterally, no wheezing, no crackles, no rhonchi Abdominal: Soft, non tender, non distended, bowel sounds +, no guarding Extremities: no edema, no cyanosis, pulses palpable bilaterally DP and PT Neuro: Grossly nonfocal  Discharge Instructions     Medication List         acetaminophen 500 MG tablet  Commonly known as:  TYLENOL  Take 500 mg by mouth every 4 (four) hours as needed. Fever/pain     alum & mag hydroxide-simeth 200-200-20 MG/5ML suspension  Commonly known as:  MAALOX/MYLANTA  Take 30 mLs by mouth every 6 (six) hours as needed for indigestion.     aspirin 81 MG tablet  Take 81 mg by mouth daily.     ciprofloxacin 500 MG tablet  Commonly known as:  CIPRO  Take 1 tablet (500 mg total) by mouth 2 (two) times daily.     diltiazem 180 MG 24 hr capsule  Commonly known as:  CARDIZEM CD  Take 1 capsule (180 mg total) by mouth daily.     feeding supplement (GLUCERNA SHAKE) Liqd  Take 237 mLs by mouth 2 (two) times daily between meals.     furosemide 40 MG tablet  Commonly known as:  LASIX  Take 40 mg by mouth daily.     glipiZIDE 10 MG tablet  Commonly known as:  GLUCOTROL  Take 10 mg by mouth 2 (two) times daily before a meal.     hydrocerin Crea  Apply 1 application topically 2 (two) times daily.     IOPHEN-NR 100 MG/5ML liquid  Generic drug:  guaiFENesin  Take 200 mg by mouth every 6 (six) hours as needed. For cough     isosorbide mononitrate 30 MG 24 hr tablet  Commonly known as:  IMDUR  Take 1 tablet (30 mg total) by mouth daily.     loperamide 2 MG capsule  Commonly known as:  IMODIUM  Take 2 mg by mouth as needed for diarrhea or loose stools.     LORazepam 0.5 MG tablet  Commonly known as:  ATIVAN  Take 1 tablet (0.5 mg total) by mouth at bedtime as needed. Sleeping     magnesium hydroxide 400 MG/5ML suspension  Commonly known as:  MILK OF MAGNESIA  Take 30 mLs  by mouth at bedtime as needed for constipation.     metoprolol 50 MG tablet  Commonly known as:  LOPRESSOR  Take 1 tablet (50 mg total) by mouth 2 (two) times daily.     nitroGLYCERIN 0.4 MG SL tablet  Commonly known as:  NITROSTAT  Place 1 tablet (0.4 mg total) under the tongue every 5 (five) minutes x 3 doses as needed for chest pain.     tamsulosin 0.4 MG Caps capsule  Commonly known as:  FLOMAX  Take 0.4 mg by mouth daily.     THERA/BETA-CAROTENE PO  Take 1 tablet by mouth daily.     Vitamin D (Ergocalciferol) 50000 UNITS Caps capsule  Commonly known as:  DRISDOL  Take 50,000 Units  by mouth.           Follow-up Information   Follow up with Sissy Hoff, MD In 2 weeks.   Specialty:  Family Medicine   Contact information:   [address], [address] [place] [address] [phone]       Follow up with Debbora Presto, MD. (As needed if symptoms worsen call my cell [phone])    Specialty:  Internal Medicine   Contact information:   [address] [place] [address] [phone]        The results of significant diagnostics from this hospitalization (including imaging, microbiology, ancillary and laboratory) are listed below for reference.     Microbiology: Recent Results (from the past 240 hour(s))  URINE CULTURE     Status: None   Collection Time    05/21/13 12:50 PM      Result Value Range Status   Specimen Description URINE, CLEAN CATCH   Final   Special Requests NONE   Final   Culture  Setup Time     Final   Value: 05/21/2013 22:30     Performed at [facility] Count     Final   Value: >=100,000 COLONIES/ML     Performed at [facility]   Culture     Final   Value: ESCHERICHIA COLI     Performed at [facility]   Report Status 05/23/2013 FINAL   Final   Organism ID, Bacteria ESCHERICHIA COLI   Final  URINE CULTURE     Status: None   Collection Time    05/22/13  2:56 PM      Result Value  Range Status   Specimen Description URINE, CATHETERIZED   Final   Special Requests NONE   Final   Culture  Setup Time     Final   Value: 05/23/2013 00:19     Performed at [facility] Count     Final   Value: 25,000 COLONIES/ML     Performed at [facility]   Culture     Final   Value: ESCHERICHIA COLI     Performed at [facility]   Report Status PENDING   Incomplete     Labs: Basic Metabolic Panel:  Recent Labs Lab 05/21/13 1323 05/21/13 1728 05/23/13 0542 05/24/13 0545  NA 132* 133* 135 137  K 4.2 4.2 4.2 3.7  CL 93* 94* 100 102  CO2 27 28 28 25   GLUCOSE 305* 285* 185* 215*  BUN 28* 27* 35* 35*  CREATININE 1.71* 1.78* 1.78* 1.48*  CALCIUM 9.6 9.1 8.8 8.7  MG  --  2.4  --   --   PHOS  --  3.6  --   --    Liver Function Tests:  Recent Labs Lab 05/21/13 1728  AST 9  ALT 10  ALKPHOS 74  BILITOT 0.9  PROT 6.3  ALBUMIN 3.2*   CBC:  Recent Labs Lab 05/21/13 1323 05/21/13 1728 05/23/13 0542 05/24/13 0545  WBC 25.8* 26.4* 16.4* 15.1*  NEUTROABS  --  18.2*  --   --   HGB 13.9 13.2 11.8* 11.6*  HCT 41.8 39.4 36.1* 35.4*  MCV 91.5 91.0 91.9 91.9  PLT 188 169 151 172   CBG:  Recent Labs Lab 05/23/13 2111 05/24/13 0723 05/24/13 1200 05/24/13 1648 05/24/13 2107  GLUCAP 233* 183* 198* 237* 204*     SIGNED: Time coordinating discharge: Over 30 minutes  Debbora Presto, MD  [facility] 05/25/2013, 5:51 AM Pager [phone]  If 7PM-7AM, please contact night-coverage [email or web address] Password [email or web address]

## 2013-05-25 NOTE — Progress Notes (Signed)
Clinical Social Work  CSW faxed DC summary and FL2 to [facility] who is agreeable to accept patient today. Patient and dtr aware of plans and request that [facility] transport patient back to ALF. CSW prepared packet with FL2 and DNR form and hard scripts. CSW coordinated transportation via [place]. Request #: [id number].  CSW is signing off but available if needed.  [place], [place] [phone]

## 2013-05-25 NOTE — Care Management Note (Signed)
    Page 1 of 2   05/25/2013     3:26:16 PM   CARE MANAGEMENT NOTE 05/25/2013  Patient:  [name], [name]   Account Number:  [id number]  Date Initiated:  05/22/2013  Documentation initiated by:  DAVIS,TYMEEKA  Subjective/Objective Assessment:   79 yo male admitted with UTI.PCP: Sissy Hoff, MD     Action/Plan:   SNF   Anticipated DC Date:  05/25/2013   Anticipated DC Plan:  SKILLED NURSING FACILITY  In-house referral  Clinical Social Worker      DC Planning Services  CM consult      Choice offered to / List presented to:  NA   DME arranged  NA      DME agency  NA     HH arranged  NA      HH agency  NA   Status of service:  Completed, signed off Medicare Important Message given?   (If response is "NO", the following Medicare IM given date fields will be blank) Date Medicare IM given:   Date Additional Medicare IM given:    Discharge Disposition:  ASSISTED LIVING  Per UR Regulation:  Reviewed for med. necessity/level of care/duration of stay  If discussed at Long Length of Stay Meetings, dates discussed:    Comments:  05/25/2013 Colleen Can BSN RN CCM [phone] Pt to discharge to Assisted Living today where he will received pt/ot at facility. Transfer assisted by CSW.  Sharmon Leyden, RN Registered Nurse Signed CASE MANAGEMENT Care Management Note Service date: 05/22/2013 10:32 AM Pt from SNF. CSW to follow. Cm to sign off.

## 2013-05-25 NOTE — Progress Notes (Signed)
Physical Therapy Treatment Patient Details Name: QUAMAINE WEBB MRN: [id number] DOB: 1914-03-31 Today's Date: 05/25/2013 Time: 6962-9528 PT Time Calculation (min): 24 min  PT Assessment / Plan / Recommendation  History of Present Illness pt was admitted for hematuria and abdominal pain   PT Comments   Assisted pt OOb to amb in hallway.  Used RW this session for increased safety and to increase amb distance.  Amb from room to end of hallway.  One sitting rest break, then amb back to room and positioned in recliner and set pt up for lunch.   Follow Up Recommendations  SNF([facility])     Does the patient have the potential to tolerate intense rehabilitation     Barriers to Discharge        Equipment Recommendations       Recommendations for Other Services    Frequency     Progress towards PT Goals Progress towards PT goals: Progressing toward goals  Plan      Precautions / Restrictions Precautions Precautions: Fall Restrictions Weight Bearing Restrictions: No    Pertinent Vitals/Pain No c/o pain    Mobility  Bed Mobility Bed Mobility: Supine to Sit Supine to Sit: 4: Min guard Details for Bed Mobility Assistance: increased time Transfers Transfers: Sit to Stand;Stand to Sit Sit to Stand: 4: Min assist;From bed;With upper extremity assist;4: Min guard;From chair/3-in-1 Stand to Sit: 4: Min assist;To chair/3-in-1;With upper extremity assist;4: Min guard Details for Transfer Assistance: assist to rise and steady Ambulation/Gait Ambulation/Gait Assistance: 4: Min assist Ambulation Distance (Feet): 150 Feet (75 feet x 2 one sitting rest break) Assistive device: Rolling walker Ambulation/Gait Assistance Details: Used a RW this time for increased safety/steadyness and to increased amb distance. 50% VC's on proper upright posture and proper walker to self distance. Gait Pattern: Step-through pattern;Decreased step length - right;Decreased step length -  left;Shuffle;Antalgic;Trunk flexed Gait velocity: decr      PT Goals (current goals can now be found in the care plan section)    Visit Information  Last PT Received On: 05/25/13 History of Present Illness: pt was admitted for hematuria and abdominal pain    Subjective Data      Cognition       Balance     End of Session PT - End of Session Equipment Utilized During Treatment: Gait belt Activity Tolerance: Patient limited by fatigue Patient left: in chair;with call bell/phone within reach;with chair alarm set   Felecia Shelling  PTA [facility]  Acute  Rehab Pager      [phone]

## 2013-05-25 NOTE — Progress Notes (Addendum)
Patient discharge to SNF, alert and oriented, discharged package given to [facility] for delivery to SNF, patient in stable condition at this time

## 2013-05-25 NOTE — Progress Notes (Signed)
Clinical Social Work  Patient was discussed during progression and MD reports that son has concerns about HCPOA. Son faxed paperwork which was the same paperwork that was in EPIC which states that [name] (patient's dtr) is HCPOA and [name] (patient's son) is durable POA. CSW staffed case with assistant director, [name], who reports that even though son is durable POA that dtr has right to make decisions regarding DC planning.  CSW spoke with patient who wants to return to ALF and refuses SNF placement. CSW spoke with dtr who is agreeable to plans as long as patient is. Per PT notes, patient using min. Assistance and ambulating close to 80 feet. CSW spoke with [place] at ALF who reports that they can provide support for patient and can accept patient back today. CSW spoke with son and explained that [name] is HCPOA and that she will be able to make decisions. Son is aware of DC back to ALF.  CSW text paged MD information and will DC once DC summary completed. RN aware of plans.  CSW will continue to follow.  [place], [place] [phone]

## 2013-05-26 NOTE — H&P (Signed)
[name] ZOX:[id number] DOB: May 05, 1914 DOA: 05/21/2013  Referring physician: ER physician  PCP: Sissy Hoff, MD  Chief Complaint: blood in urine  HPI:  79 year old male with past medical history of hypertension, hypothyroidism, BPH, DM, prostate cancer who presented to [facility] ED 05/21/2013 from nursing home with blood in urine started today on the day of this admission. Pt reported having associated abdominal discomfort but no nausea or vomiting. No diarrhea or constipation. NO fever or chills. No lightheadedness or loss of consciousness.  In ED, BP is on soft side, 144/70, HR 84 and T 99 F, O2 saturation is 94% on room air. CBC revealed leukocytosis of 26 and BMP revealed creatinine of 1.71. UA revealed large leukocyte esterases. CT abdomen revealed multiple findings related to prostate cancer; non-obstructing calculi within left kidney. Pt was started on rocephin in ED.  Assessment and Plan:  Principal Problem:  UTI  - continue rocephin  - follow up urine culture results  Active Problems:  HTN (hypertension)  - continue Cardizem 180 mg daily, metoprolol 50 mg twice daily and Imdur 30 mg daily  DM (diabetes mellitus)  - continue glipizide  BPH (benign prostatic hyperplasia)  - continue flomax  Acute renal failure  - perhaps due to dehydration  - continue IV fluids  - follow up BMP in am  - baseline creatinine since last admission is 1.5  Leukocytosis, unspecified  - secondary to UTI  Prostate cancer, primary, with metastasis from prostate to other site  - per CT abdomen, improving multiple sites of prostate cancer  Radiological Exams on Admission:  Ct Abdomen Pelvis Wo Contrast  05/21/2013 IMPRESSION: 1. No acute abnormalities involving the abdomen or pelvis. 2. Multiple findings related to metastatic prostate cancer as noted previously, though imaging findings are improved since March, 2013.;3. Distal descending and sigmoid colon diverticulosis without evidence of acute  diverticulitis. 4. Non-obstructing calculi within the left side of the previously identified horseshoe kidney. 5. Stable marked pancreatic atrophy.  Code Status: Full  Family Communication: Pt at bedside  Disposition Plan: Admit for further evaluation  Manson Passey, MD  [facility]  Pager [phone]  Review of Systems:  Constitutional: Negative for fever, chills and malaise/fatigue. Negative for diaphoresis.  HENT: Negative for hearing loss, ear pain, nosebleeds, congestion, sore throat, neck pain, tinnitus and ear discharge.  Eyes: Negative for blurred vision, double vision, photophobia, pain, discharge and redness.  Respiratory: Negative for cough, hemoptysis, sputum production, shortness of breath, wheezing and stridor.  Cardiovascular: Negative for chest pain, palpitations, orthopnea, claudication and leg swelling.  Gastrointestinal: Negative for nausea, vomiting and abdominal pain. Negative for heartburn, constipation, blood in stool and melena.  Genitourinary: per HPI  Musculoskeletal: Negative for myalgias, back pain, joint pain and falls.  Skin: Negative for itching and rash.  Neurological: Negative for dizziness and weakness. Negative for tingling, tremors, sensory change, speech change, focal weakness, loss of consciousness and headaches.  Endo/Heme/Allergies: Negative for environmental allergies and polydipsia. Does not bruise/bleed easily.  Psychiatric/Behavioral: Negative for suicidal ideas. The patient is not nervous/anxious.  Past Medical History   Diagnosis  Date   .  Prostate cancer    .  Hypothyroidism    .  Hyperglycemia    .  DMII (diabetes mellitus, type 2)    .  Prostate cancer, primary, with metastasis from prostate to other site  12/06/2011   .  Prostate cancer metastatic to multiple sites    .  A-fib    .  CKD (  chronic kidney disease)     Past Surgical History   Procedure  Laterality  Date   .  Melanoma removed from left leg   1971     x 14   .  Left  cataract extraction     .  Prostate surgery     .  Sinus exploration      Social History: reports that he has never smoked. He has never used smokeless tobacco. He reports that he does not drink alcohol or use illicit drugs.  Allergies   Allergen  Reactions   .  Accupril [Quinapril Hcl]  Other (See Comments)     unknown   .  Naproxen  Rash    Family History   Problem  Relation  Age of Onset   .  CAD  Father  31    Prior to Admission medications   Medication  Sig  Start Date  End Date  Taking?  Authorizing Provider   acetaminophen (TYLENOL) 500 MG tablet  Take 500 mg by mouth every 4 (four) hours as needed. Fever/pain    Yes  Historical Provider, MD   alum & mag hydroxide-simeth (MAALOX/MYLANTA) 200-200-20 MG/5ML suspension  Take 30 mLs by mouth every 6 (six) hours as needed for indigestion.    Yes  Historical Provider, MD   aspirin 81 MG tablet  Take 81 mg by mouth daily.    Yes  Historical Provider, MD   diltiazem (CARDIZEM CD) 180 MG 24 hr capsule  Take 1 capsule (180 mg total) by mouth daily.  10/12/11  03/17/22  Yes  Lesle Chris Black, NP   feeding supplement (GLUCERNA SHAKE) LIQD  Take 237 mLs by mouth 2 (two) times daily between meals.  12/19/12   Yes  Belkys A Regalado, MD   furosemide (LASIX) 40 MG tablet  Take 40 mg by mouth daily.    Yes  Historical Provider, MD   glipiZIDE (GLUCOTROL XL) 10 MG 24 hr tablet  Take 10 mg by mouth 2 (two) times daily.    Yes  Historical Provider, MD   glipiZIDE (GLUCOTROL) 10 MG tablet  Take 10 mg by mouth 2 (two) times daily before a meal.    Yes  Historical Provider, MD   guaiFENesin (IOPHEN-NR) 100 MG/5ML liquid  Take 200 mg by mouth every 6 (six) hours as needed. For cough    Yes  Historical Provider, MD   hydrocerin (EUCERIN) CREA  Apply 1 application topically 2 (two) times daily.    Yes  Historical Provider, MD   isosorbide mononitrate (IMDUR) 30 MG 24 hr tablet  Take 1 tablet (30 mg total) by mouth daily.  03/17/12   Yes  Everette Rank, MD    loperamide (IMODIUM) 2 MG capsule  Take 2 mg by mouth as needed for diarrhea or loose stools.    Yes  Historical Provider, MD   LORazepam (ATIVAN) 0.5 MG tablet  Take 0.5 mg by mouth at bedtime as needed. Sleeping    Yes  Historical Provider, MD   magnesium hydroxide (MILK OF MAGNESIA) 400 MG/5ML suspension  Take 30 mLs by mouth at bedtime as needed for constipation.    Yes  Historical Provider, MD   metoprolol (LOPRESSOR) 50 MG tablet  Take 1 tablet (50 mg total) by mouth 2 (two) times daily.  10/12/11  03/17/22  Yes  Lesle Chris Black, NP   Multiple Vitamin (THERA/BETA-CAROTENE PO)  Take 1 tablet by mouth daily.    Yes  Historical  Provider, MD   nitroGLYCERIN (NITROSTAT) 0.4 MG SL tablet  Place 1 tablet (0.4 mg total) under the tongue every 5 (five) minutes x 3 doses as needed for chest pain.  03/17/12   Yes  Everette Rank, MD   Tamsulosin HCl (FLOMAX) 0.4 MG CAPS  Take 0.4 mg by mouth daily.    Yes  Historical Provider, MD   Vitamin D, Ergocalciferol, (DRISDOL) 50000 UNITS CAPS  Take 50,000 Units by mouth.    Yes  Historical Provider, MD   Physical Exam:  Filed Vitals:    05/21/13 1236   BP:  144/70   Pulse:  84   Temp:  99 F (37.2 C)   TempSrc:  Oral   Resp:  16   SpO2:  94%    Physical Exam  Constitutional: Appears well-developed and well-nourished. No distress.  HENT: Normocephalic. External right and left ear normal.  Eyes: Conjunctivae and EOM are normal. PERRLA, no scleral icterus.  Neck: Normal ROM. Neck supple. No JVD. No tracheal deviation. No thyromegaly.  CVS: RRR, S1/S2 appreciated  Pulmonary: Effort and breath sounds normal, no stridor, rhonchi, wheezes, rales.  Abdominal: Soft. BS +, no distension, tenderness, rebound or guarding.  Musculoskeletal: Normal range of motion. No edema and no tenderness.  Lymphadenopathy: No lymphadenopathy noted, cervical, inguinal. Neuro: Alert. Normal reflexes, No focal neurologic deficits.  Skin: Skin is warm and dry.  Labs on Admission:   Basic Metabolic Panel:   Recent Labs  Lab  05/21/13 1323   NA  132*   K  4.2   CL  93*   CO2  27   GLUCOSE  305*   BUN  28*   CREATININE  1.71*   CALCIUM  9.6    Liver Function Tests:  No results found for this basename: AST, ALT, ALKPHOS, BILITOT, PROT, ALBUMIN, in the last 168 hours  No results found for this basename: LIPASE, AMYLASE, in the last 168 hours  No results found for this basename: AMMONIA, in the last 168 hours  CBC:   Recent Labs  Lab  05/21/13 1323   WBC  25.8*   HGB  13.9   HCT  41.8   MCV  91.5   PLT  188    Cardiac Enzymes:  No results found for this basename: CKTOTAL, CKMB, CKMBINDEX, TROPONINI, in the last 168 hours  BNP:  No components found with this basename: POCBNP,  CBG:  No results found for this basename: GLUCAP, in the last 168 hours  If 7PM-7AM, please contact night-coverage  [email or web address]  Password [email or web address]  05/21/2013, 4:12 PM

## 2013-07-19 IMAGING — CR DG CHEST 2V
3 series · 3 of 3 positions shown · non-contrast
Comparison: March 14, 2012.

CLINICAL DATA: Hemoptysis, cough

CHEST - 2 VIEW

[x chest ap (1 of 2)]
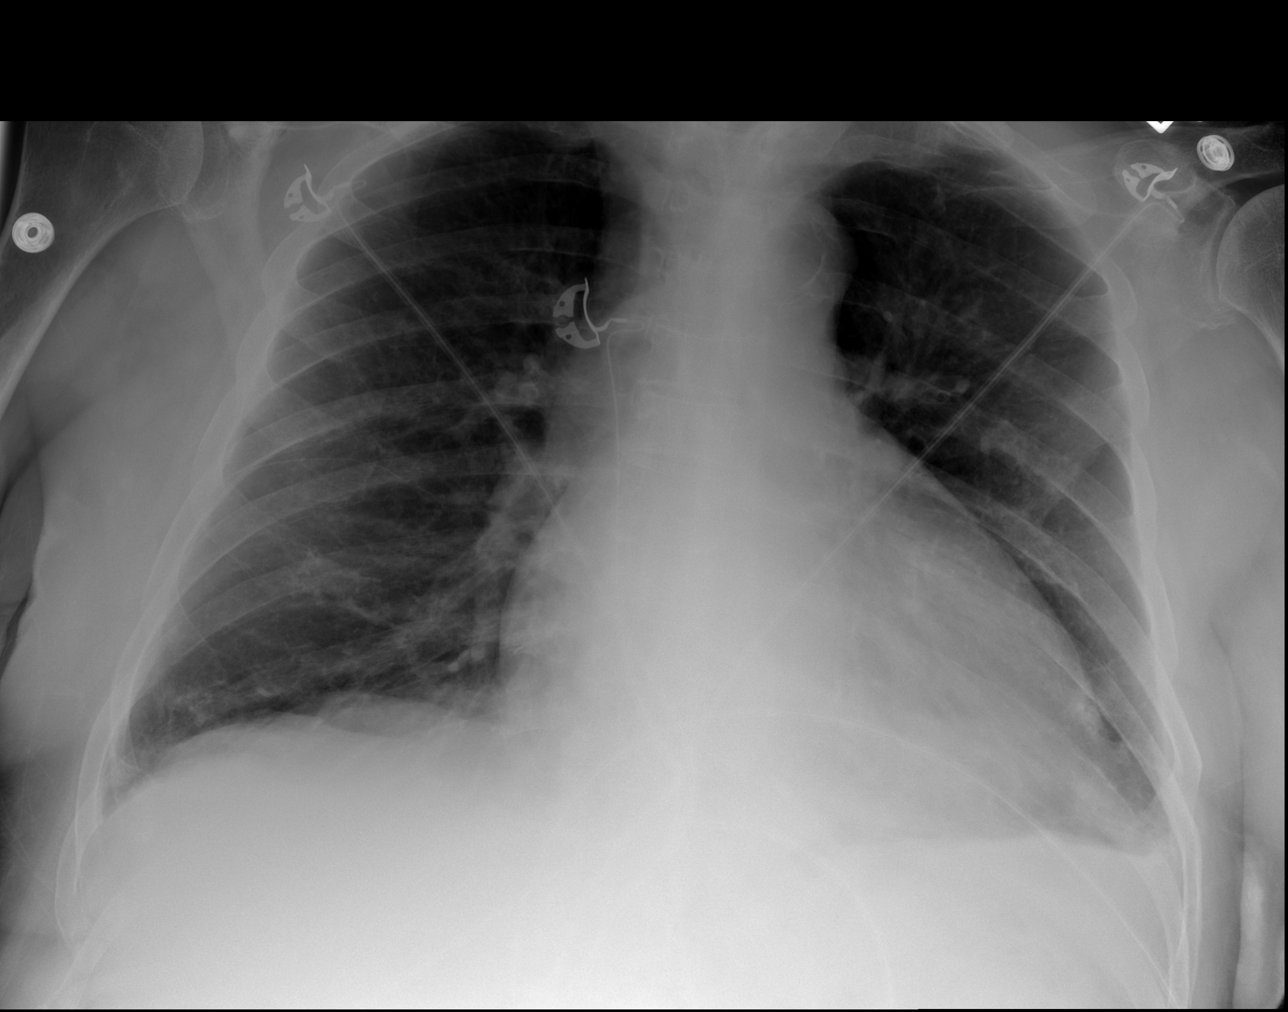

[x chest ap (2 of 2)]
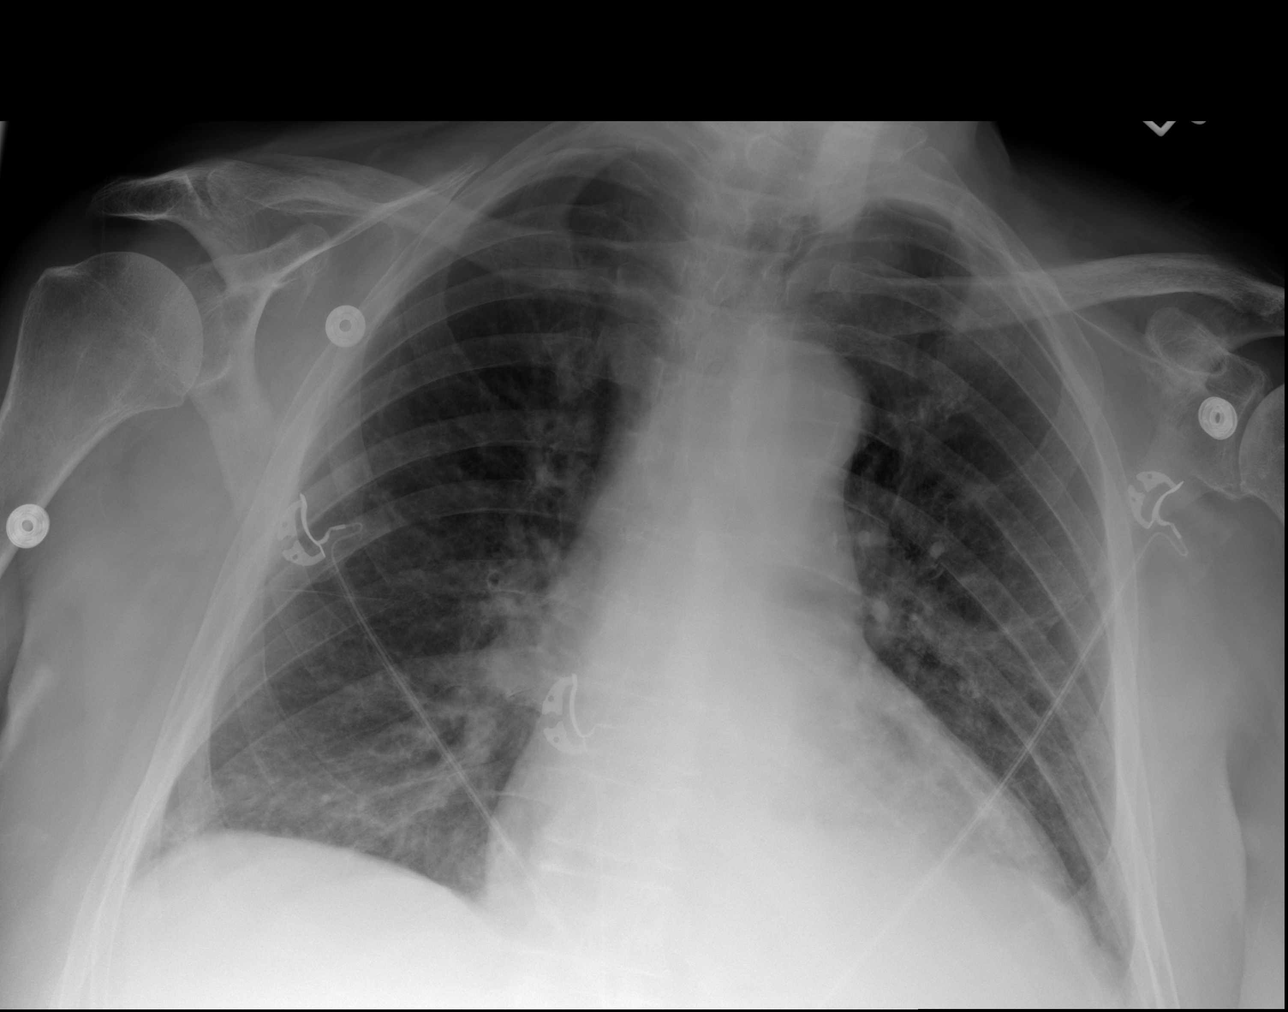

[w chest lat]
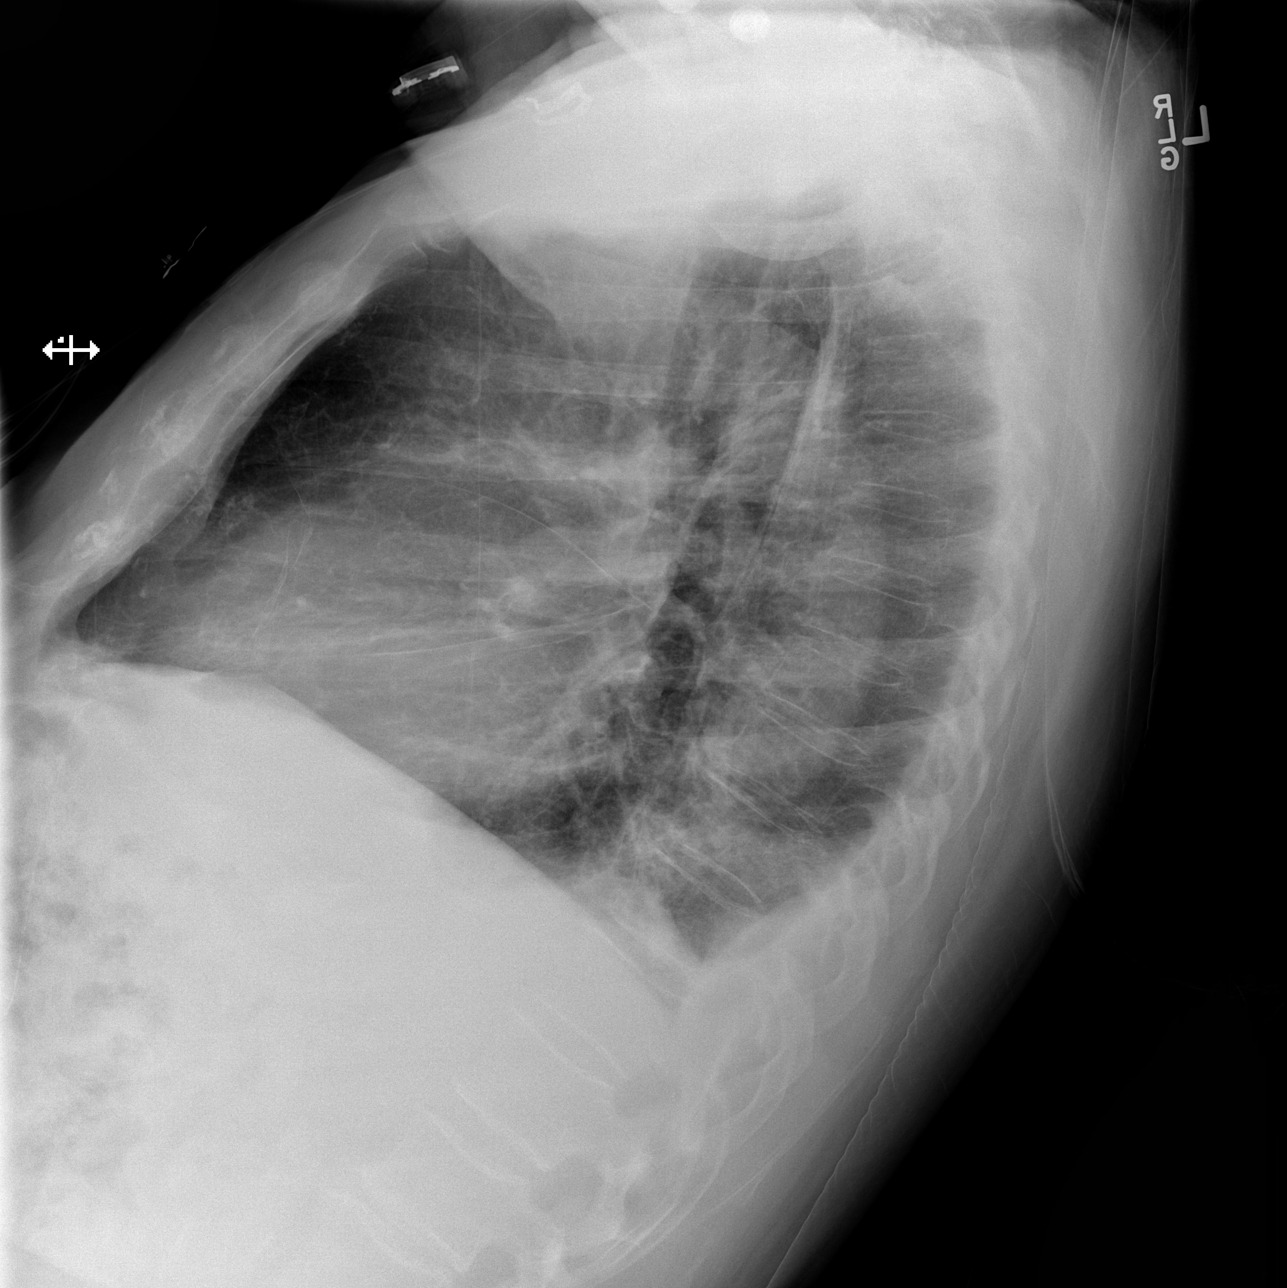

[3 of 3 positions shown; findings below may reference images not displayed]

FINDINGS: Stable mild cardiomegaly.  Minimal left pleural effusion
is noted and smaller compared to prior exam.  Faint left upper lobe
opacity is noted suggesting possible pneumonia. Right lung is
clear.  No pneumothorax is noted.  Bony thorax intact.
IMPRESSION: Minimal left pleural effusion.  Ill-defined mild left upper lobe
opacity is noted concerning for pneumonia or subsegmental
atelectasis; follow-up radiographs are recommended to ensure
resolution.

## 2013-10-13 ENCOUNTER — Other Ambulatory Visit: Payer: Self-pay | Admitting: Dermatology

## 2013-12-20 ENCOUNTER — Encounter (HOSPITAL_COMMUNITY): Payer: Self-pay | Admitting: Emergency Medicine

## 2013-12-20 ENCOUNTER — Emergency Department (HOSPITAL_COMMUNITY): Payer: Medicare Other

## 2013-12-20 ENCOUNTER — Inpatient Hospital Stay (HOSPITAL_COMMUNITY)
Admission: EM | Admit: 2013-12-20 | Discharge: 2013-12-25 | DRG: 872 | Disposition: A | Discharge status: Skilled Nursing Facility | Payer: Medicare Other | Attending: Internal Medicine | Admitting: Internal Medicine

## 2013-12-20 DIAGNOSIS — Z7982 Long term (current) use of aspirin: Secondary | ICD-10-CM | POA: Diagnosis not present

## 2013-12-20 DIAGNOSIS — L02419 Cutaneous abscess of limb, unspecified: Secondary | ICD-10-CM | POA: Diagnosis present

## 2013-12-20 DIAGNOSIS — R4182 Altered mental status, unspecified: Secondary | ICD-10-CM | POA: Diagnosis present

## 2013-12-20 DIAGNOSIS — R6520 Severe sepsis without septic shock: Secondary | ICD-10-CM | POA: Diagnosis present

## 2013-12-20 DIAGNOSIS — E872 Acidosis, unspecified: Secondary | ICD-10-CM

## 2013-12-20 DIAGNOSIS — Z8546 Personal history of malignant neoplasm of prostate: Secondary | ICD-10-CM

## 2013-12-20 DIAGNOSIS — R410 Disorientation, unspecified: Secondary | ICD-10-CM

## 2013-12-20 DIAGNOSIS — N289 Disorder of kidney and ureter, unspecified: Secondary | ICD-10-CM

## 2013-12-20 DIAGNOSIS — N179 Acute kidney failure, unspecified: Secondary | ICD-10-CM | POA: Diagnosis present

## 2013-12-20 DIAGNOSIS — I129 Hypertensive chronic kidney disease with stage 1 through stage 4 chronic kidney disease, or unspecified chronic kidney disease: Secondary | ICD-10-CM | POA: Diagnosis present

## 2013-12-20 DIAGNOSIS — Z888 Allergy status to other drugs, medicaments and biological substances status: Secondary | ICD-10-CM | POA: Diagnosis not present

## 2013-12-20 DIAGNOSIS — Z8249 Family history of ischemic heart disease and other diseases of the circulatory system: Secondary | ICD-10-CM

## 2013-12-20 DIAGNOSIS — Z85820 Personal history of malignant melanoma of skin: Secondary | ICD-10-CM | POA: Diagnosis not present

## 2013-12-20 DIAGNOSIS — L03119 Cellulitis of unspecified part of limb: Secondary | ICD-10-CM | POA: Diagnosis present

## 2013-12-20 DIAGNOSIS — I4891 Unspecified atrial fibrillation: Secondary | ICD-10-CM | POA: Diagnosis present

## 2013-12-20 DIAGNOSIS — N183 Chronic kidney disease, stage 3 unspecified: Secondary | ICD-10-CM | POA: Diagnosis present

## 2013-12-20 DIAGNOSIS — L0390 Cellulitis, unspecified: Secondary | ICD-10-CM

## 2013-12-20 DIAGNOSIS — E119 Type 2 diabetes mellitus without complications: Secondary | ICD-10-CM

## 2013-12-20 DIAGNOSIS — A419 Sepsis, unspecified organism: Secondary | ICD-10-CM | POA: Diagnosis present

## 2013-12-20 DIAGNOSIS — Z886 Allergy status to analgesic agent status: Secondary | ICD-10-CM

## 2013-12-20 DIAGNOSIS — Z66 Do not resuscitate: Secondary | ICD-10-CM | POA: Diagnosis present

## 2013-12-20 DIAGNOSIS — E039 Hypothyroidism, unspecified: Secondary | ICD-10-CM | POA: Diagnosis present

## 2013-12-20 DIAGNOSIS — H9190 Unspecified hearing loss, unspecified ear: Secondary | ICD-10-CM | POA: Diagnosis present

## 2013-12-20 DIAGNOSIS — A409 Streptococcal sepsis, unspecified: Principal | ICD-10-CM | POA: Diagnosis present

## 2013-12-20 DIAGNOSIS — R7881 Bacteremia: Secondary | ICD-10-CM | POA: Diagnosis present

## 2013-12-20 DIAGNOSIS — N189 Chronic kidney disease, unspecified: Secondary | ICD-10-CM

## 2013-12-20 DIAGNOSIS — I4892 Unspecified atrial flutter: Secondary | ICD-10-CM | POA: Diagnosis present

## 2013-12-20 DIAGNOSIS — Z794 Long term (current) use of insulin: Secondary | ICD-10-CM | POA: Diagnosis not present

## 2013-12-20 DIAGNOSIS — B951 Streptococcus, group B, as the cause of diseases classified elsewhere: Secondary | ICD-10-CM

## 2013-12-20 DIAGNOSIS — R531 Weakness: Secondary | ICD-10-CM

## 2013-12-20 DIAGNOSIS — L03115 Cellulitis of right lower limb: Secondary | ICD-10-CM

## 2013-12-20 DIAGNOSIS — I10 Essential (primary) hypertension: Secondary | ICD-10-CM

## 2013-12-20 LAB — BASIC METABOLIC PANEL
Anion gap: 17 — ABNORMAL HIGH (ref 5–15)
BUN: 33 mg/dL — ABNORMAL HIGH (ref 6–23)
CALCIUM: 8.7 mg/dL (ref 8.4–10.5)
CO2: 25 meq/L (ref 19–32)
Chloride: 97 mEq/L (ref 96–112)
Creatinine, Ser: 1.87 mg/dL — ABNORMAL HIGH (ref 0.50–1.35)
GFR calc non Af Amer: 28 mL/min — ABNORMAL LOW (ref >90)
GFR, EST AFRICAN AMERICAN: 33 mL/min — AB (ref >90)
Glucose, Bld: 177 mg/dL — ABNORMAL HIGH (ref 70–99)
Potassium: 4.2 mEq/L (ref 3.7–5.3)
Sodium: 139 mEq/L (ref 137–147)

## 2013-12-20 LAB — GLUCOSE, CAPILLARY
GLUCOSE-CAPILLARY: 66 mg/dL — AB (ref 70–99)
GLUCOSE-CAPILLARY: 69 mg/dL — AB (ref 70–99)
Glucose-Capillary: 95 mg/dL (ref 70–99)
Glucose-Capillary: 76 mg/dL (ref 70–99)

## 2013-12-20 LAB — CBC WITH DIFFERENTIAL/PLATELET
BASOS ABS: 0 10*3/uL (ref 0.0–0.1)
Basophils Relative: 0 % (ref 0–1)
Eosinophils Absolute: 0.1 10*3/uL (ref 0.0–0.7)
Eosinophils Relative: 1 % (ref 0–5)
HEMATOCRIT: 42.4 % (ref 39.0–52.0)
HEMOGLOBIN: 14.4 g/dL (ref 13.0–17.0)
LYMPHS PCT: 14 % (ref 12–46)
Lymphs Abs: 1.7 10*3/uL (ref 0.7–4.0)
MCH: 31.4 pg (ref 26.0–34.0)
MCHC: 34 g/dL (ref 30.0–36.0)
MCV: 92.6 fL (ref 78.0–100.0)
MONO ABS: 0.5 10*3/uL (ref 0.1–1.0)
MONOS PCT: 4 % (ref 3–12)
Neutro Abs: 9.8 10*3/uL — ABNORMAL HIGH (ref 1.7–7.7)
Neutrophils Relative %: 81 % — ABNORMAL HIGH (ref 43–77)
Platelets: 129 10*3/uL — ABNORMAL LOW (ref 150–400)
RBC: 4.58 MIL/uL (ref 4.22–5.81)
RDW: 13.6 % (ref 11.5–15.5)
WBC: 12 10*3/uL — AB (ref 4.0–10.5)

## 2013-12-20 LAB — COMPREHENSIVE METABOLIC PANEL
ALBUMIN: 3.7 g/dL (ref 3.5–5.2)
ALK PHOS: 100 U/L (ref 39–117)
ALT: 13 U/L (ref 0–53)
AST: 17 U/L (ref 0–37)
Anion gap: 18 — ABNORMAL HIGH (ref 5–15)
BILIRUBIN TOTAL: 0.6 mg/dL (ref 0.3–1.2)
BUN: 33 mg/dL — AB (ref 6–23)
CHLORIDE: 96 meq/L (ref 96–112)
CO2: 24 meq/L (ref 19–32)
Calcium: 9.5 mg/dL (ref 8.4–10.5)
Creatinine, Ser: 1.94 mg/dL — ABNORMAL HIGH (ref 0.50–1.35)
GFR calc Af Amer: 31 mL/min — ABNORMAL LOW (ref >90)
GFR calc non Af Amer: 27 mL/min — ABNORMAL LOW (ref >90)
GLUCOSE: 214 mg/dL — AB (ref 70–99)
POTASSIUM: 4.5 meq/L (ref 3.7–5.3)
Sodium: 138 mEq/L (ref 137–147)
Total Protein: 6.5 g/dL (ref 6.0–8.3)

## 2013-12-20 LAB — POC OCCULT BLOOD, ED: Fecal Occult Bld: NEGATIVE

## 2013-12-20 LAB — CBC
HCT: 37.8 % — ABNORMAL LOW (ref 39.0–52.0)
Hemoglobin: 12.3 g/dL — ABNORMAL LOW (ref 13.0–17.0)
MCH: 30.1 pg (ref 26.0–34.0)
MCHC: 32.5 g/dL (ref 30.0–36.0)
MCV: 92.6 fL (ref 78.0–100.0)
PLATELETS: 122 10*3/uL — AB (ref 150–400)
RBC: 4.08 MIL/uL — ABNORMAL LOW (ref 4.22–5.81)
RDW: 13.6 % (ref 11.5–15.5)
WBC: 20.1 10*3/uL — ABNORMAL HIGH (ref 4.0–10.5)

## 2013-12-20 LAB — URINALYSIS, ROUTINE W REFLEX MICROSCOPIC
Bilirubin Urine: NEGATIVE
Glucose, UA: 100 mg/dL — AB
Ketones, ur: NEGATIVE mg/dL
Leukocytes, UA: NEGATIVE
Nitrite: NEGATIVE
PROTEIN: NEGATIVE mg/dL
Specific Gravity, Urine: 1.014 (ref 1.005–1.030)
Urobilinogen, UA: 0.2 mg/dL (ref 0.0–1.0)
pH: 6.5 (ref 5.0–8.0)

## 2013-12-20 LAB — LIPASE, BLOOD: Lipase: 13 U/L (ref 11–59)

## 2013-12-20 LAB — I-STAT TROPONIN, ED: Troponin i, poc: 0.01 ng/mL (ref 0.00–0.08)

## 2013-12-20 LAB — URINE MICROSCOPIC-ADD ON

## 2013-12-20 LAB — MRSA PCR SCREENING: MRSA by PCR: POSITIVE — AB

## 2013-12-20 LAB — MAGNESIUM: Magnesium: 2.1 mg/dL (ref 1.5–2.5)

## 2013-12-20 LAB — I-STAT CG4 LACTIC ACID, ED: Lactic Acid, Venous: 5.5 mmol/L — ABNORMAL HIGH (ref 0.5–2.2)

## 2013-12-20 LAB — LACTIC ACID, PLASMA: Lactic Acid, Venous: 4.3 mmol/L — ABNORMAL HIGH (ref 0.5–2.2)

## 2013-12-20 MED ORDER — HYDROCERIN EX CREA
1.0000 "application " | TOPICAL_CREAM | Freq: Two times a day (BID) | CUTANEOUS | Status: DC
  Administered 2013-12-20 – 2013-12-25 (×11): 1 via TOPICAL
  Filled 2013-12-20: qty 113

## 2013-12-20 MED ORDER — SODIUM CHLORIDE 0.9 % IV BOLUS (SEPSIS)
500.0000 mL | Freq: Once | INTRAVENOUS | Status: AC
  Administered 2013-12-20: 500 mL via INTRAVENOUS

## 2013-12-20 MED ORDER — ACETAMINOPHEN 325 MG PO TABS
650.0000 mg | ORAL_TABLET | ORAL | Status: DC | PRN
  Administered 2013-12-21: 650 mg via ORAL
  Administered 2013-12-23: 325 mg via ORAL
  Filled 2013-12-20 (×2): qty 2

## 2013-12-20 MED ORDER — BICALUTAMIDE 50 MG PO TABS
50.0000 mg | ORAL_TABLET | Freq: Every day | ORAL | Status: DC
  Administered 2013-12-20 – 2013-12-25 (×6): 50 mg via ORAL
  Filled 2013-12-20 (×6): qty 1

## 2013-12-20 MED ORDER — VANCOMYCIN HCL IN DEXTROSE 1-5 GM/200ML-% IV SOLN
1000.0000 mg | INTRAVENOUS | Status: DC
  Administered 2013-12-22: 1000 mg via INTRAVENOUS
  Filled 2013-12-20 (×2): qty 200

## 2013-12-20 MED ORDER — HEPARIN SODIUM (PORCINE) 5000 UNIT/ML IJ SOLN
5000.0000 [IU] | Freq: Three times a day (TID) | INTRAMUSCULAR | Status: DC
  Administered 2013-12-20 – 2013-12-25 (×16): 5000 [IU] via SUBCUTANEOUS
  Filled 2013-12-20 (×28): qty 1

## 2013-12-20 MED ORDER — GLUCERNA SHAKE PO LIQD
237.0000 mL | Freq: Two times a day (BID) | ORAL | Status: DC
  Administered 2013-12-20 – 2013-12-25 (×12): 237 mL via ORAL

## 2013-12-20 MED ORDER — SENNOSIDES-DOCUSATE SODIUM 8.6-50 MG PO TABS
1.0000 | ORAL_TABLET | Freq: Two times a day (BID) | ORAL | Status: DC
  Administered 2013-12-20 – 2013-12-25 (×8): 1 via ORAL
  Filled 2013-12-20 (×12): qty 1

## 2013-12-20 MED ORDER — VANCOMYCIN HCL 1000 MG IV SOLR
1250.0000 mg | Freq: Once | INTRAVENOUS | Status: AC
  Administered 2013-12-20: 1250 mg via INTRAVENOUS
  Filled 2013-12-20: qty 2000

## 2013-12-20 MED ORDER — INSULIN ASPART 100 UNIT/ML ~~LOC~~ SOLN
0.0000 [IU] | Freq: Three times a day (TID) | SUBCUTANEOUS | Status: DC
  Administered 2013-12-21: 2 [IU] via SUBCUTANEOUS
  Administered 2013-12-21 – 2013-12-22 (×3): 3 [IU] via SUBCUTANEOUS
  Administered 2013-12-22: 2 [IU] via SUBCUTANEOUS
  Administered 2013-12-23: 3 [IU] via SUBCUTANEOUS
  Administered 2013-12-23: 2 [IU] via SUBCUTANEOUS
  Administered 2013-12-23: 3 [IU] via SUBCUTANEOUS
  Administered 2013-12-24: 2 [IU] via SUBCUTANEOUS
  Administered 2013-12-24: 5 [IU] via SUBCUTANEOUS
  Administered 2013-12-24 – 2013-12-25 (×2): 2 [IU] via SUBCUTANEOUS
  Administered 2013-12-25: 3 [IU] via SUBCUTANEOUS

## 2013-12-20 MED ORDER — FLUTICASONE PROPIONATE 50 MCG/ACT NA SUSP
1.0000 | Freq: Two times a day (BID) | NASAL | Status: DC
  Administered 2013-12-20 – 2013-12-25 (×11): 1 via NASAL
  Filled 2013-12-20: qty 16

## 2013-12-20 MED ORDER — ONDANSETRON HCL 4 MG/2ML IJ SOLN
4.0000 mg | Freq: Once | INTRAMUSCULAR | Status: AC
  Administered 2013-12-20: 4 mg via INTRAVENOUS
  Filled 2013-12-20: qty 2

## 2013-12-20 MED ORDER — MUPIROCIN 2 % EX OINT
1.0000 "application " | TOPICAL_OINTMENT | Freq: Two times a day (BID) | CUTANEOUS | Status: AC
  Administered 2013-12-20 – 2013-12-24 (×10): 1 via NASAL
  Filled 2013-12-20: qty 22

## 2013-12-20 MED ORDER — SODIUM CHLORIDE 0.9 % IV SOLN
INTRAVENOUS | Status: DC
  Administered 2013-12-20: 100 mL/h via INTRAVENOUS
  Administered 2013-12-20: 125 mL/h via INTRAVENOUS
  Administered 2013-12-20 – 2013-12-24 (×6): via INTRAVENOUS

## 2013-12-20 MED ORDER — METOPROLOL TARTRATE 25 MG PO TABS: 50.0000 mg | ORAL_TABLET | Freq: Two times a day (BID) | ORAL | Status: DC

## 2013-12-20 MED ORDER — TAMSULOSIN HCL 0.4 MG PO CAPS
0.4000 mg | ORAL_CAPSULE | Freq: Every day | ORAL | Status: DC
  Administered 2013-12-20 – 2013-12-25 (×6): 0.4 mg via ORAL
  Filled 2013-12-20 (×7): qty 1

## 2013-12-20 MED ORDER — LORAZEPAM 0.5 MG PO TABS
0.5000 mg | ORAL_TABLET | ORAL | Status: DC | PRN
  Administered 2013-12-22 – 2013-12-25 (×12): 0.5 mg via ORAL
  Filled 2013-12-20 (×12): qty 1

## 2013-12-20 MED ORDER — CHLORHEXIDINE GLUCONATE CLOTH 2 % EX PADS
6.0000 | MEDICATED_PAD | Freq: Every day | CUTANEOUS | Status: AC
  Administered 2013-12-20 – 2013-12-23 (×4): 6 via TOPICAL

## 2013-12-20 MED ORDER — LEVOTHYROXINE SODIUM 125 MCG PO TABS
125.0000 ug | ORAL_TABLET | Freq: Every day | ORAL | Status: DC
  Administered 2013-12-20 – 2013-12-25 (×6): 125 ug via ORAL
  Filled 2013-12-20 (×9): qty 1

## 2013-12-20 MED ORDER — DILTIAZEM HCL ER COATED BEADS 180 MG PO CP24: 180.0000 mg | ORAL_CAPSULE | Freq: Every day | ORAL | Status: DC

## 2013-12-20 MED ORDER — ASPIRIN 81 MG PO CHEW
81.0000 mg | CHEWABLE_TABLET | Freq: Every day | ORAL | Status: DC
  Administered 2013-12-20 – 2013-12-23 (×4): 81 mg via ORAL
  Filled 2013-12-20 (×8): qty 1

## 2013-12-20 MED ORDER — PIPERACILLIN-TAZOBACTAM 3.375 G IVPB
3.3750 g | Freq: Three times a day (TID) | INTRAVENOUS | Status: DC
  Administered 2013-12-20 – 2013-12-22 (×6): 3.375 g via INTRAVENOUS
  Filled 2013-12-20 (×10): qty 50

## 2013-12-20 MED ORDER — HYDROCODONE-ACETAMINOPHEN 5-325 MG PO TABS
1.0000 | ORAL_TABLET | ORAL | Status: DC | PRN
  Administered 2013-12-21 – 2013-12-25 (×8): 1 via ORAL
  Filled 2013-12-20 (×10): qty 1

## 2013-12-20 MED ORDER — SODIUM CHLORIDE 0.9 % IV BOLUS (SEPSIS): 1000.0000 mL | Freq: Once | INTRAVENOUS | Status: DC

## 2013-12-20 MED ORDER — PIPERACILLIN-TAZOBACTAM 3.375 G IVPB 30 MIN
3.3750 g | Freq: Once | INTRAVENOUS | Status: AC
  Administered 2013-12-20: 3.375 g via INTRAVENOUS
  Filled 2013-12-20: qty 50

## 2013-12-20 MED ORDER — MAGNESIUM HYDROXIDE 400 MG/5ML PO SUSP: 30.0000 mL | Freq: Every evening | ORAL | Status: DC | PRN

## 2013-12-20 NOTE — Progress Notes (Signed)
[place] TEAM 1 - Stepdown/ICU TEAM Progress Note  [name] [id number]:[id number] DOB: [birth year] DOA: 12/20/2013 PCP: Gara Kroner, MD  Admit HPI / Brief Narrative: 80 y.o. male who brought in from his ALF after they found him slumped over and generally weak with vomit around his mouth. In the ED he was alert but slightly confused.     Work up in ED noted mild AKI, and cellulitis of his RLE. He has an extensive history of recurrent cellulitis of the RLE per his daughter.  HPI/Subjective: Pt seen for f/u visit.    Assessment/Plan:  R LE cellulitis  A recurring issue w/ extensive prior hx of excisions for melanoma of R LE   Sepsis (HR 106 + Temp 38.8 + WBC 12) Due to cellulitis   Hypotension  Acute on CKD baseline crt appear to be ~1.5  Chronic Afib   Hypothyroid  DM2  Prostate CA w/ multi-focal mets  MRSA screen +  Code Status: NO CODE - DNR Family Communication: spoke w/ daugher at bedside  Disposition Plan: transfer to medical bed   Consultants: none  Procedures: none  Antibiotics: Zosyn 7/25 >> Vanc 7/25 >>  DVT prophylaxis: SQ heparin  Objective: Blood pressure 89/70, pulse 69, temperature 98.3 F (36.8 C), temperature source Oral, resp. rate 18, height 6\' 2"  (1.88 m), weight 83.8 kg (184 lb 11.9 oz), SpO2 100.00%.  Intake/Output Summary (Last 24 hours) at 12/20/13 1033 Last data filed at 12/20/13 1000  Gross per 24 hour  Intake   1005 ml  Output      0 ml  Net   1005 ml   Exam: F/U exam completed  Data Reviewed: Basic Metabolic Panel:  Recent Labs Lab 12/20/13 0026 12/20/13 0042 12/20/13 0303  NA 138  --  139  K 4.5  --  4.2  CL 96  --  97  CO2 24  --  25  GLUCOSE 214*  --  177*  BUN 33*  --  33*  CREATININE 1.94*  --  1.87*  CALCIUM 9.5  --  8.7  MG  --  2.1  --    Liver Function Tests:  Recent Labs Lab 12/20/13 0026  AST 17  ALT 13  ALKPHOS 100  BILITOT 0.6  PROT 6.5  ALBUMIN 3.7    Recent Labs Lab  12/20/13 0026  LIPASE 13   No results found for this basename: AMMONIA,  in the last 168 hours  Coags: No results found for this basename: PT, INR,  in the last 168 hours No results found for this basename: PTT,  in the last 168 hours  CBC:  Recent Labs Lab 12/20/13 0026 12/20/13 0303  WBC 12.0* 20.1*  NEUTROABS 9.8*  --   HGB 14.4 12.3*  HCT 42.4 37.8*  MCV 92.6 92.6  PLT 129* 122*   CBG:  Recent Labs Lab 12/20/13 0803  GLUCAP 95    Recent Results (from the past 240 hour(s))  MRSA PCR SCREENING     Status: Abnormal   Collection Time    12/20/13  4:36 AM      Result Value Ref Range Status   MRSA by PCR POSITIVE (*) NEGATIVE Final   Comment:            The GeneXpert MRSA Assay (FDA     approved for NASAL specimens     only), is one component of a     comprehensive MRSA colonization     surveillance program. It  is not     intended to diagnose MRSA     infection nor to guide or     monitor treatment for     MRSA infections.     RESULT CALLED TO, READ BACK BY AND VERIFIED WITH:     CALLED TO RN Vista Lawman [id number] @[phone]  THANEY    Studies:  Recent x-ray studies have been reviewed in detail by the Attending Physician  Scheduled Meds:  Scheduled Meds: . aspirin  81 mg Oral QHS  . bicalutamide  50 mg Oral Daily  . Chlorhexidine Gluconate Cloth  6 each Topical Q0600  . feeding supplement (GLUCERNA SHAKE)  237 mL Oral BID BM  . fluticasone  1 spray Each Nare BID  . heparin  5,000 Units Subcutaneous 3 times per day  . hydrocerin  1 application Topical BID  . insulin aspart  0-9 Units Subcutaneous TID WC  . levothyroxine  125 mcg Oral QAC breakfast  . mupirocin ointment  1 application Nasal BID  . piperacillin-tazobactam (ZOSYN)  IV  3.375 g Intravenous Q8H  . senna-docusate  1 tablet Oral BID  . sodium chloride  1,000 mL Intravenous Once  . tamsulosin  0.4 mg Oral Daily  . [START ON 12/22/2013] vancomycin  1,000 mg Intravenous Q48H   Time spent on care of  this patient: 25+ mins  Cecilia Vancleve T , MD   [facility] Office  [phone] Pager - Text Page per Shea Evans as per below:  On-Call/Text Page:      [email or web address]      password [email or web address]  If 7PM-7AM, please contact night-coverage [email or web address] Password [facility] 12/20/2013, 10:33 AM   LOS: 0 days

## 2013-12-20 NOTE — ED Provider Notes (Signed)
CSN: [id number]     Arrival date & time 12/20/13  0014 History   First MD Initiated Contact with Patient 12/20/13 0016     Chief Complaint  Patient presents with  . Altered Mental Status     (Consider location/radiation/quality/duration/timing/severity/associated sxs/prior Treatment) HPI Comments: 80 year old male from assisted-living with history of high blood pressure, diabetes, prostate hyperplasia, prostate cancer the metastasis, UTI, DO NOT RESUSCITATE presents with altered mental status and possibly coffee-ground emesis. Patient was found slumped over and generally weak with the vomit around his mouth. No witnessed episode or known details of events. Patient says he feels generally weak however details are difficult due to confusion. No known current antibiotics. Unknown how long patient has had cellulitis of right leg.  Patient is a 80 y.o. male presenting with altered mental status. The history is provided by the patient, the EMS personnel and medical records.  Altered Mental Status   Past Medical History  Diagnosis Date  . Prostate cancer   . Hypothyroidism   . Hyperglycemia   . DMII (diabetes mellitus, type 2)   . Prostate cancer, primary, with metastasis from prostate to other site 12/06/2011  . Prostate cancer metastatic to multiple sites   . A-fib   . CKD (chronic kidney disease)    Past Surgical History  Procedure Laterality Date  . Melanoma removed from left leg  1971    x 14  . Left cataract extraction    . Prostate surgery    . Sinus exploration     Family History  Problem Relation Age of Onset  . CAD Father 90   History  Substance Use Topics  . Smoking status: Never Smoker   . Smokeless tobacco: Never Used  . Alcohol Use: No    Review of Systems  Unable to perform ROS: Mental status change      Allergies  Accupril and Naproxen  Home Medications   Prior to Admission medications   Medication Sig Start Date End Date Taking? Authorizing Provider   acetaminophen (TYLENOL) 500 MG tablet Take 500 mg by mouth every 4 (four) hours as needed. Fever/pain    Historical Provider, MD  alum & mag hydroxide-simeth (MAALOX/MYLANTA) 200-200-20 MG/5ML suspension Take 30 mLs by mouth every 6 (six) hours as needed for indigestion.    Historical Provider, MD  aspirin 81 MG tablet Take 81 mg by mouth daily.    Historical Provider, MD  ciprofloxacin (CIPRO) 500 MG tablet Take 1 tablet (500 mg total) by mouth 2 (two) times daily. 05/25/13   Theodis Blaze, MD  diltiazem (CARDIZEM CD) 180 MG 24 hr capsule Take 1 capsule (180 mg total) by mouth daily. 10/12/11 03/17/22  Radene Gunning, NP  feeding supplement (GLUCERNA SHAKE) LIQD Take 237 mLs by mouth 2 (two) times daily between meals. 12/19/12   Belkys A Regalado, MD  furosemide (LASIX) 40 MG tablet Take 40 mg by mouth daily.    Historical Provider, MD  glipiZIDE (GLUCOTROL) 10 MG tablet Take 10 mg by mouth 2 (two) times daily before a meal.    Historical Provider, MD  guaiFENesin (IOPHEN-NR) 100 MG/5ML liquid Take 200 mg by mouth every 6 (six) hours as needed. For cough    Historical Provider, MD  hydrocerin (EUCERIN) CREA Apply 1 application topically 2 (two) times daily.    Historical Provider, MD  isosorbide mononitrate (IMDUR) 30 MG 24 hr tablet Take 1 tablet (30 mg total) by mouth daily. 03/17/12   Jettie Booze, MD  loperamide (IMODIUM) 2 MG capsule Take 2 mg by mouth as needed for diarrhea or loose stools.    Historical Provider, MD  LORazepam (ATIVAN) 0.5 MG tablet Take 1 tablet (0.5 mg total) by mouth at bedtime as needed. Sleeping 05/25/13   Theodis Blaze, MD  magnesium hydroxide (MILK OF MAGNESIA) 400 MG/5ML suspension Take 30 mLs by mouth at bedtime as needed for constipation.    Historical Provider, MD  metoprolol (LOPRESSOR) 50 MG tablet Take 1 tablet (50 mg total) by mouth 2 (two) times daily. 10/12/11 03/17/22  Radene Gunning, NP  Multiple Vitamin (THERA/BETA-CAROTENE PO) Take 1 tablet by mouth  daily.    Historical Provider, MD  nitroGLYCERIN (NITROSTAT) 0.4 MG SL tablet Place 1 tablet (0.4 mg total) under the tongue every 5 (five) minutes x 3 doses as needed for chest pain. 03/17/12   Jettie Booze, MD  Tamsulosin HCl (FLOMAX) 0.4 MG CAPS Take 0.4 mg by mouth daily.    Historical Provider, MD  Vitamin D, Ergocalciferol, (DRISDOL) 50000 UNITS CAPS Take 50,000 Units by mouth.    Historical Provider, MD   BP 120/57  Resp 19  Ht 6\' 2"  (1.88 m)  Wt 180 lb (81.647 kg)  BMI 23.10 kg/m2  SpO2 94% Physical Exam  Nursing note and vitals reviewed. Constitutional: He appears well-developed and well-nourished.  HENT:  Head: Normocephalic and atraumatic.   dry mucous membranes  Eyes: Right eye exhibits no discharge. Left eye exhibits no discharge.  Neck: Normal range of motion. Neck supple. No tracheal deviation (no meningismus) present.  Cardiovascular: Regular rhythm.   Pulmonary/Chest: Effort normal. Respiratory distress: decreased effort.  Abdominal: Soft. He exhibits no distension. There is no tenderness. There is no guarding.  Musculoskeletal: He exhibits edema (mild edema bilateral lower extremities worse on the right).  Neurological: He is alert. GCS eye subscore is 3. GCS verbal subscore is 4. GCS motor subscore is 6.  General confusion patient is not no date of birth or location speech slower to respond.  Skin: Skin is warm. Rash noted. There is erythema.  Patient has significant erythema and warmth right mid thigh down to right mid tibia no crepitus appreciated.  Psychiatric:  Confused    ED Course  Procedures (including critical care time) Labs Review Labs Reviewed  MRSA PCR SCREENING - Abnormal; Notable for the following:    MRSA by PCR POSITIVE (*)    All other components within normal limits  CBC WITH DIFFERENTIAL - Abnormal; Notable for the following:    WBC 12.0 (*)    Platelets 129 (*)    Neutrophils Relative % 81 (*)    Neutro Abs 9.8 (*)    All other  components within normal limits  COMPREHENSIVE METABOLIC PANEL - Abnormal; Notable for the following:    Glucose, Bld 214 (*)    BUN 33 (*)    Creatinine, Ser 1.94 (*)    GFR calc non Af Amer 27 (*)    GFR calc Af Amer 31 (*)    Anion gap 18 (*)    All other components within normal limits  URINALYSIS, ROUTINE W REFLEX MICROSCOPIC - Abnormal; Notable for the following:    Glucose, UA 100 (*)    Hgb urine dipstick SMALL (*)    All other components within normal limits  URINE MICROSCOPIC-ADD ON - Abnormal; Notable for the following:    Casts HYALINE CASTS (*)    All other components within normal limits  CBC - Abnormal; Notable  for the following:    WBC 20.1 (*)    RBC 4.08 (*)    Hemoglobin 12.3 (*)    HCT 37.8 (*)    Platelets 122 (*)    All other components within normal limits  BASIC METABOLIC PANEL - Abnormal; Notable for the following:    Glucose, Bld 177 (*)    BUN 33 (*)    Creatinine, Ser 1.87 (*)    GFR calc non Af Amer 28 (*)    GFR calc Af Amer 33 (*)    Anion gap 17 (*)    All other components within normal limits  LACTIC ACID, PLASMA - Abnormal; Notable for the following:    Lactic Acid, Venous 4.3 (*)    All other components within normal limits  I-STAT CG4 LACTIC ACID, ED - Abnormal; Notable for the following:    Lactic Acid, Venous 5.50 (*)    All other components within normal limits  CULTURE, BLOOD (ROUTINE X 2)  CULTURE, BLOOD (ROUTINE X 2)  LIPASE, BLOOD  MAGNESIUM  OCCULT BLOOD GASTRIC / DUODENUM (SPECIMEN CUP)  I-STAT TROPOININ, ED  POC OCCULT BLOOD, ED    Imaging Review Ct Head Wo Contrast  12/20/2013   CLINICAL DATA:  Altered mental status.  EXAM: CT HEAD WITHOUT CONTRAST  TECHNIQUE: Contiguous axial images were obtained from the base of the skull through the vertex without intravenous contrast.  COMPARISON:  None.  FINDINGS: There is no evidence of acute infarction, mass lesion, or intra- or extra-axial hemorrhage on CT.  Prominence of the  ventricles and sulci reflects moderately severe cortical volume loss. Cerebellar atrophy is noted. Scattered periventricular and subcortical white matter likely reflects small vessel ischemic microangiopathy. A small chronic lacunar infarct is seen at the left thalamus.  The brainstem and fourth ventricle are within normal limits. The cerebral hemispheres demonstrate grossly normal gray-white differentiation. No mass effect or midline shift is seen.  There is no evidence of fracture; visualized osseous structures are unremarkable in appearance. The orbits are within normal limits. The paranasal sinuses and mastoid air cells are well-aerated. No significant soft tissue abnormalities are seen.  IMPRESSION: 1. No acute intracranial pathology seen on CT. 2. Moderately severe cortical volume loss and scattered small vessel ischemic microangiopathy. Small chronic lacunar infarct at the left thalamus.   Electronically Signed   By: Garald Balding M.D.   On: 12/20/2013 01:54   Dg Chest Port 1 View  12/20/2013   CLINICAL DATA:  Altered mental status.  EXAM: PORTABLE CHEST - 1 VIEW  COMPARISON:  12/18/2012.  FINDINGS: 0138 hrs. The cardio pericardial silhouette is enlarged. Interstitial markings are diffusely coarsened with chronic features. No edema or focal airspace consolidation. No evidence for pleural effusion. Telemetry leads overlie the chest.  IMPRESSION: Low volumes with cardiomegaly.  No acute cardiopulmonary findings.   Electronically Signed   By: Misty Stanley M.D.   On: 12/20/2013 02:05     EKG Interpretation   Date/Time:  Sunday December 20 2013 00:25:20 EDT Ventricular Rate:  107 PR Interval:  76 QRS Duration: 110 QT Interval:  484 QTC Calculation: 646 R Axis:   85 Text Interpretation:  Sinus tachycardia Low voltage with right axis  deviation Prolonged QT interval Confirmed by Derl Abalos  MD, Asuna Peth ([id number]) on  12/20/2013 12:43:58 AM      MDM   Final diagnoses:  Cellulitis of right leg  Confusion   General weakness  Sepsis ARF  Patient from assisted-living with known DO NOT RESUSCITATE  presents with altered mental status and clinically significant cellulitis. Plan for infectious and metabolic workup. IV antibiotics ordered.  Patient lactic acidosis, cellulitis, clinically sepsis. Fluid bolus in ER. Acute renal therapy. Spoke with hospitalist for admission.  Any x-rays performed were personally reviewed by myself.   Differential diagnosis were considered with the presenting HPI.  Medications  sodium chloride 0.9 % bolus 1,000 mL (not administered)  0.9 %  sodium chloride infusion (125 mL/hr Intravenous New Bag/Given 12/20/13 0425)  vancomycin (VANCOCIN) IVPB 1000 mg/200 mL premix (not administered)  piperacillin-tazobactam (ZOSYN) IVPB 3.375 g (not administered)  bicalutamide (CASODEX) tablet 50 mg (not administered)  aspirin chewable tablet 81 mg (not administered)  acetaminophen (TYLENOL) tablet 650 mg (not administered)  feeding supplement (GLUCERNA SHAKE) (GLUCERNA SHAKE) liquid 237 mL (not administered)  fluticasone (FLONASE) 50 MCG/ACT nasal spray 1 spray (not administered)  hydrocerin (EUCERIN) cream 1 application (not administered)  levothyroxine (SYNTHROID, LEVOTHROID) tablet 125 mcg (not administered)  tamsulosin (FLOMAX) capsule 0.4 mg (not administered)  senna-docusate (Senokot-S) tablet 1 tablet (not administered)  LORazepam (ATIVAN) tablet 0.5 mg (not administered)  HYDROcodone-acetaminophen (NORCO/VICODIN) 5-325 MG per tablet 1 tablet (not administered)  magnesium hydroxide (MILK OF MAGNESIA) suspension 30 mL (not administered)  insulin aspart (novoLOG) injection 0-9 Units (not administered)  heparin injection 5,000 Units (not administered)  ondansetron (ZOFRAN) injection 4 mg (4 mg Intravenous Given 12/20/13 0149)  sodium chloride 0.9 % bolus 500 mL (0 mLs Intravenous Stopped 12/20/13 0309)  vancomycin (VANCOCIN) 1,250 mg in sodium chloride 0.9 % 250 mL IVPB  (1,250 mg Intravenous Transfusing/Transfer 12/20/13 0346)  sodium chloride 0.9 % bolus 500 mL (500 mLs Intravenous Transfusing/Transfer 12/20/13 0346)  piperacillin-tazobactam (ZOSYN) IVPB 3.375 g (0 g Intravenous Stopped 12/20/13 0224)      Filed Vitals:   12/20/13 0330 12/20/13 0415 12/20/13 0500 12/20/13 0600  BP: 102/45 93/36 93/39  94/36  Pulse: 102  87 70  Temp:  98.3 F (36.8 C)    TempSrc:  Oral    Resp: 22  21 19   Height:  6\' 2"  (1.88 m)    Weight:  184 lb 11.9 oz (83.8 kg)    SpO2: 96%  98% 99%    Admission/ observation were discussed with the admitting physician, patient and/or family and they are comfortable with the plan.      Mariea Clonts, MD 12/20/13 [phone]

## 2013-12-20 NOTE — Progress Notes (Signed)
ANTIBIOTIC CONSULT NOTE - INITIAL  Pharmacy Consult for Vancomycin Indication: rule out sepsis  Allergies  Allergen Reactions  . Accupril [Quinapril Hcl] Other (See Comments)    unknown  . Naproxen Rash    Patient Measurements: Height: 6\' 2"  (188 cm) Weight: 180 lb (81.647 kg) IBW/kg (Calculated) : 82.2  Vital Signs: Temp: 101.9 F (38.8 C) (07/26 0049) Temp src: Rectal (07/26 0049) BP: 118/54 mmHg (07/26 0100) Pulse Rate: 106 (07/26 0100) Intake/Output from previous day:   Intake/Output from this shift:    Labs:  Recent Labs  12/20/13 0026  WBC 12.0*  HGB 14.4  PLT 129*  CREATININE 1.94*   Estimated Creatinine Clearance: 24.5 ml/min (by C-G formula based on Cr of 1.94). No results found for this basename: VANCOTROUGH, VANCOPEAK, VANCORANDOM, GENTTROUGH, GENTPEAK, GENTRANDOM, TOBRATROUGH, TOBRAPEAK, TOBRARND, AMIKACINPEAK, AMIKACINTROU, AMIKACIN,  in the last 72 hours   Microbiology: No results found for this or any previous visit (from the past 720 hour(s)).  Medical History: Past Medical History  Diagnosis Date  . Prostate cancer   . Hypothyroidism   . Hyperglycemia   . DMII (diabetes mellitus, type 2)   . Prostate cancer, primary, with metastasis from prostate to other site 12/06/2011  . Prostate cancer metastatic to multiple sites   . A-fib   . CKD (chronic kidney disease)     Medications:  APAP  ASA  Casodex  Zyrtec  Cardizem  FLonase  Lasix  Glucotrol  Lantus  Imdur  Synthroid  Ativan  Lopressor  MVI  Ntg  FLomax  Vit D  Assessment: 80 yo male with AMS, cellulitis, possible sepsis, for empiric antibiotics  Goal of Therapy:  Vancomycin trough 15-20  Plan:  Vancomycin 1250 mg IV as ordered in ED, then 1 g IV q48h Zosyn 3.375 g IV q8h   Thyra Yinger, Bronson Curb 12/20/2013,2:18 AM

## 2013-12-20 NOTE — H&P (Signed)
[facility] History and Physical  [name] TML:[id number] DOB: 20-Apr-1914 DOA: 12/20/2013  Referring physician: EDP PCP: Gara Kroner, MD   Chief Complaint: Sepsis   HPI: [name] is a 80 y.o. male who is brought in from his ALF after they reportedly found him slumped over there and generally weak with vomit around his mouth.  He was brought in, has slight confusion / AMS in the ED.  He states he feels generally weak, is not short of breath, feels like he is getting enough air, is not in pain anywhere, but further details are somewhat difficult due to confusion / difficulty hearing.  Work up in ED is positive for mild AKI, sepsis with likely source being the cellulitis on his RLE.  He is unsure how long the RLE has been cellulitic this time, though he has an extensive history of recurrent cellulitis of the RLE per his daughter when I called her on phone.  Review of Systems: Denies being in any pain, seems to indicate he understands we called daughter to let her know he was in hospital, very hard of hearing, Systems reviewed.  As above, otherwise negative  Past Medical History  Diagnosis Date  . Prostate cancer   . Hypothyroidism   . Hyperglycemia   . DMII (diabetes mellitus, type 2)   . Prostate cancer, primary, with metastasis from prostate to other site 12/06/2011  . Prostate cancer metastatic to multiple sites   . A-fib   . CKD (chronic kidney disease)    Past Surgical History  Procedure Laterality Date  . Melanoma removed from left leg  1971    x 14  . Left cataract extraction    . Prostate surgery    . Sinus exploration     Social History:  reports that he has never smoked. He has never used smokeless tobacco. He reports that he does not drink alcohol or use illicit drugs.  Allergies  Allergen Reactions  . Accupril [Quinapril Hcl] Other (See Comments)    unknown  . Naproxen Rash    Family History  Problem Relation Age of Onset  . CAD Father  85     Prior to Admission medications   Medication Sig Start Date End Date Taking? Authorizing Provider  acetaminophen (TYLENOL) 500 MG tablet Take 500 mg by mouth every 4 (four) hours as needed. Fever/pain   Yes Historical Provider, MD  alum & mag hydroxide-simeth (MAALOX/MYLANTA) 200-200-20 MG/5ML suspension Take 30 mLs by mouth every 6 (six) hours as needed for indigestion.   Yes Historical Provider, MD  aspirin 81 MG chewable tablet Chew 81 mg by mouth at bedtime.   Yes Historical Provider, MD  bicalutamide (CASODEX) 50 MG tablet Take 50 mg by mouth daily.   Yes Historical Provider, MD  cetirizine (ZYRTEC) 10 MG tablet Take 10 mg by mouth daily.   Yes Historical Provider, MD  diltiazem (CARDIZEM CD) 180 MG 24 hr capsule Take 1 capsule (180 mg total) by mouth daily. 10/12/11 03/17/22 Yes Lezlie Octave Black, NP  feeding supplement (GLUCERNA SHAKE) LIQD Take 237 mLs by mouth 2 (two) times daily between meals. 12/19/12  Yes Belkys A Regalado, MD  fluticasone (FLONASE) 50 MCG/ACT nasal spray Place 1 spray into both nostrils 2 (two) times daily.   Yes Historical Provider, MD  furosemide (LASIX) 40 MG tablet Take 40 mg by mouth daily.   Yes Historical Provider, MD  glipiZIDE (GLUCOTROL) 10 MG tablet Take 10 mg by mouth 2 (two) times  daily before a meal.   Yes Historical Provider, MD  guaiFENesin (IOPHEN-NR) 100 MG/5ML liquid Take 200 mg by mouth every 6 (six) hours as needed for cough. For cough   Yes Historical Provider, MD  hydrocerin (EUCERIN) CREA Apply 1 application topically 2 (two) times daily.   Yes Historical Provider, MD  HYDROcodone-acetaminophen (NORCO/VICODIN) 5-325 MG per tablet Take 1 tablet by mouth every 4 (four) hours as needed for moderate pain.   Yes Historical Provider, MD  Insulin Glargine (LANTUS SOLOSTAR) 100 UNIT/ML Solostar Pen Inject 8 Units into the skin at bedtime.   Yes Historical Provider, MD  isosorbide mononitrate (IMDUR) 30 MG 24 hr tablet Take 1 tablet (30 mg total) by  mouth daily. 03/17/12  Yes Jettie Booze, MD  levothyroxine (SYNTHROID, LEVOTHROID) 125 MCG tablet Take 125 mcg by mouth daily before breakfast.   Yes Historical Provider, MD  loperamide (IMODIUM) 2 MG capsule Take 2 mg by mouth as needed for diarrhea or loose stools.   Yes Historical Provider, MD  LORazepam (ATIVAN) 0.5 MG tablet Take 0.5 mg by mouth at bedtime.   Yes Historical Provider, MD  LORazepam (ATIVAN) 0.5 MG tablet Take 0.5 mg by mouth every 4 (four) hours as needed for anxiety.   Yes Historical Provider, MD  magnesium hydroxide (MILK OF MAGNESIA) 400 MG/5ML suspension Take 30 mLs by mouth at bedtime as needed for constipation.   Yes Historical Provider, MD  metoprolol (LOPRESSOR) 50 MG tablet Take 1 tablet (50 mg total) by mouth 2 (two) times daily. 10/12/11 03/17/22 Yes Lezlie Octave Black, NP  Multiple Vitamin (THERA/BETA-CAROTENE PO) Take 1 tablet by mouth daily.   Yes Historical Provider, MD  nitroGLYCERIN (NITROSTAT) 0.4 MG SL tablet Place 1 tablet (0.4 mg total) under the tongue every 5 (five) minutes x 3 doses as needed for chest pain. 03/17/12  Yes Jettie Booze, MD  sennosides-docusate sodium (SENOKOT-S) 8.6-50 MG tablet Take 1 tablet by mouth 2 (two) times daily.   Yes Historical Provider, MD  Tamsulosin HCl (FLOMAX) 0.4 MG CAPS Take 0.4 mg by mouth daily.   Yes Historical Provider, MD  Vitamin D, Ergocalciferol, (DRISDOL) 50000 UNITS CAPS Take 50,000 Units by mouth.   Yes Historical Provider, MD   Physical Exam: Filed Vitals:   12/20/13 0100  BP: 118/54  Pulse: 106  Temp:   Resp: 19    BP 118/54  Pulse 106  Temp(Src) 101.9 F (38.8 C) (Rectal)  Resp 19  Ht 6\' 2"  (1.88 m)  Wt 81.647 kg (180 lb)  BMI 23.10 kg/m2  SpO2 96%  General Appearance:    Alert, somewhat confused, toxic appearing, no distress, appears stated age  Head:    Normocephalic, atraumatic  Eyes:    PERRL, EOMI, sclera non-icteric        Nose:   Nares without drainage or epistaxis. Mucosa,  turbinates normal  Throat:   Moist mucous membranes. Oropharynx without erythema or exudate.  Neck:   Supple. No carotid bruits.  No thyromegaly.  No lymphadenopathy.   Back:     No CVA tenderness, no spinal tenderness  Lungs:     Increased respiratory rate and work of breathing, though he says he isnt feeling short of breath.  Chest wall:    No tenderness to palpitation  Heart:    Regular rate and rhythm without murmurs, gallops, rubs  Abdomen:     Soft, non-tender, nondistended, normal bowel sounds, no organomegaly  Genitalia:    deferred  Rectal:  deferred  Extremities:   No clubbing, cyanosis or edema.  Pulses:   2+ and symmetric all extremities  Skin:   Acute on chronic skin changes of the RLE, has warmth, edema, erythema, of the RLE.  Lymph nodes:   Cervical, supraclavicular, and axillary nodes normal  Neurologic:   CNII-XII intact. Normal strength, sensation and reflexes      throughout    Labs on Admission:  Basic Metabolic Panel:  Recent Labs Lab 12/20/13 0026 12/20/13 0042  NA 138  --   K 4.5  --   CL 96  --   CO2 24  --   GLUCOSE 214*  --   BUN 33*  --   CREATININE 1.94*  --   CALCIUM 9.5  --   MG  --  2.1   Liver Function Tests:  Recent Labs Lab 12/20/13 0026  AST 17  ALT 13  ALKPHOS 100  BILITOT 0.6  PROT 6.5  ALBUMIN 3.7    Recent Labs Lab 12/20/13 0026  LIPASE 13   No results found for this basename: AMMONIA,  in the last 168 hours CBC:  Recent Labs Lab 12/20/13 0026  WBC 12.0*  NEUTROABS 9.8*  HGB 14.4  HCT 42.4  MCV 92.6  PLT 129*   Cardiac Enzymes: No results found for this basename: CKTOTAL, CKMB, CKMBINDEX, TROPONINI,  in the last 168 hours  BNP (last 3 results) No results found for this basename: PROBNP,  in the last 8760 hours CBG: No results found for this basename: GLUCAP,  in the last 168 hours  Radiological Exams on Admission: Ct Head Wo Contrast  12/20/2013   CLINICAL DATA:  Altered mental status.  EXAM: CT HEAD  WITHOUT CONTRAST  TECHNIQUE: Contiguous axial images were obtained from the base of the skull through the vertex without intravenous contrast.  COMPARISON:  None.  FINDINGS: There is no evidence of acute infarction, mass lesion, or intra- or extra-axial hemorrhage on CT.  Prominence of the ventricles and sulci reflects moderately severe cortical volume loss. Cerebellar atrophy is noted. Scattered periventricular and subcortical white matter likely reflects small vessel ischemic microangiopathy. A small chronic lacunar infarct is seen at the left thalamus.  The brainstem and fourth ventricle are within normal limits. The cerebral hemispheres demonstrate grossly normal gray-white differentiation. No mass effect or midline shift is seen.  There is no evidence of fracture; visualized osseous structures are unremarkable in appearance. The orbits are within normal limits. The paranasal sinuses and mastoid air cells are well-aerated. No significant soft tissue abnormalities are seen.  IMPRESSION: 1. No acute intracranial pathology seen on CT. 2. Moderately severe cortical volume loss and scattered small vessel ischemic microangiopathy. Small chronic lacunar infarct at the left thalamus.   Electronically Signed   By: Garald Balding M.D.   On: 12/20/2013 01:54   Dg Chest Port 1 View  12/20/2013   CLINICAL DATA:  Altered mental status.  EXAM: PORTABLE CHEST - 1 VIEW  COMPARISON:  12/18/2012.  FINDINGS: 0138 hrs. The cardio pericardial silhouette is enlarged. Interstitial markings are diffusely coarsened with chronic features. No edema or focal airspace consolidation. No evidence for pleural effusion. Telemetry leads overlie the chest.  IMPRESSION: Low volumes with cardiomegaly.  No acute cardiopulmonary findings.   Electronically Signed   By: Misty Stanley M.D.   On: 12/20/2013 02:05    EKG: Independently reviewed.  Assessment/Plan Principal Problem:   Cellulitis of right lower extremity Active Problems:   HTN  (hypertension)  DM (diabetes mellitus)   Severe sepsis with acute organ dysfunction   AKI (acute kidney injury)   Lactic acidosis   1. Cellulitis of RLE causing severe sepsis -  1. zosyn and vanc per pharm consult 2. Got IVF bolus in ED, fluids at 125 cc/hr 2. Lactic acidosis - 1. Due to #1 above 2. Recheck lactate in AM with hydration 3. DM - holding home meds and putting patient on low dose SSI ac/hs. 4. AKI -  1. Pre renal secondary to #1 above 2. Hold Lasix 5. HTN - BP is soft in ED so holding other BP meds for the moment, resume cardizem and metoprolol tomorrow if BP permits.  Code Status: DNR - both historically and confirmed with daughter who is HCPOA  Family Communication: Spoke with Daughter Michele Rockers (number in chart) she is HCPOA. Disposition Plan: Admit to SDU   Time spent: 70 min  Koji Niehoff M. [facility] Pager [phone]  If 7AM-7PM, please contact the day team taking care of the patient [email or web address] Password [email or web address] 12/20/2013, 2:42 AM

## 2013-12-20 NOTE — ED Notes (Signed)
Attempted report 

## 2013-12-20 NOTE — Progress Notes (Signed)
BC aerobic gm+ cocci in chains,per lab report

## 2013-12-20 NOTE — ED Notes (Signed)
Report to [facility] on 2S.  Pt to go to floor on monitor with RN accompanying.

## 2013-12-20 NOTE — Progress Notes (Signed)
Report called to [phone] and Daughter called and informed of pending transfer to [phone]

## 2013-12-21 DIAGNOSIS — A419 Sepsis, unspecified organism: Secondary | ICD-10-CM

## 2013-12-21 DIAGNOSIS — L0291 Cutaneous abscess, unspecified: Secondary | ICD-10-CM

## 2013-12-21 DIAGNOSIS — L0390 Cellulitis, unspecified: Secondary | ICD-10-CM

## 2013-12-21 LAB — BASIC METABOLIC PANEL
ANION GAP: 15 (ref 5–15)
BUN: 37 mg/dL — ABNORMAL HIGH (ref 6–23)
CHLORIDE: 102 meq/L (ref 96–112)
CO2: 23 meq/L (ref 19–32)
Calcium: 8.5 mg/dL (ref 8.4–10.5)
Creatinine, Ser: 1.9 mg/dL — ABNORMAL HIGH (ref 0.50–1.35)
GFR calc Af Amer: 32 mL/min — ABNORMAL LOW (ref >90)
GFR calc non Af Amer: 28 mL/min — ABNORMAL LOW (ref >90)
GLUCOSE: 95 mg/dL (ref 70–99)
POTASSIUM: 4 meq/L (ref 3.7–5.3)
SODIUM: 140 meq/L (ref 137–147)

## 2013-12-21 LAB — GLUCOSE, CAPILLARY
GLUCOSE-CAPILLARY: 64 mg/dL — AB (ref 70–99)
Glucose-Capillary: 113 mg/dL — ABNORMAL HIGH (ref 70–99)
Glucose-Capillary: 129 mg/dL — ABNORMAL HIGH (ref 70–99)
Glucose-Capillary: 95 mg/dL (ref 70–99)
Glucose-Capillary: 158 mg/dL — ABNORMAL HIGH (ref 70–99)
Glucose-Capillary: 214 mg/dL — ABNORMAL HIGH (ref 70–99)
Glucose-Capillary: 167 mg/dL — ABNORMAL HIGH (ref 70–99)

## 2013-12-21 LAB — CBC
HEMATOCRIT: 35.5 % — AB (ref 39.0–52.0)
HEMOGLOBIN: 12 g/dL — AB (ref 13.0–17.0)
MCH: 31.5 pg (ref 26.0–34.0)
MCHC: 33.8 g/dL (ref 30.0–36.0)
MCV: 93.2 fL (ref 78.0–100.0)
Platelets: 110 10*3/uL — ABNORMAL LOW (ref 150–400)
RBC: 3.81 MIL/uL — AB (ref 4.22–5.81)
RDW: 14.2 % (ref 11.5–15.5)
WBC: 25.3 10*3/uL — AB (ref 4.0–10.5)

## 2013-12-21 LAB — LACTIC ACID, PLASMA: LACTIC ACID, VENOUS: 1.5 mmol/L (ref 0.5–2.2)

## 2013-12-21 NOTE — Evaluation (Signed)
Occupational Therapy Evaluation Patient Details Name: [name] MRN: [id number] DOB: 06-19-1913 Today's Date: 12/21/2013    History of Present Illness [name] is a 80 y.o. male who is brought in from his ALF after they reportedly found him slumped over there and generally weak with vomit around his mouth. He was brought in, has slight confusion / AMS in the ED. He states he feels generally weak, is not short of breath, feels like he is getting enough air, is not in pain anywhere, but further details are somewhat difficult due to confusion / difficulty hearing. Work up in ED is positive for mild AKI, sepsis with likely source being the cellulitis on his RLE. He is unsure how long the RLE has been cellulitic this time, though he has an extensive history of recurrent cellulitis of the RLE. PMH includes prostate cancer with metastasis to multiple sites, DM, CKD, and HOH.   Clinical Impression   Pt admitted with above. Pt states he was independent with ADLs, PTA. Feel pt will benefit from acute OT to increase independence prior to d/c. Recommending SNF for rehab.    Follow Up Recommendations  SNF;Supervision/Assistance - 24 hour    Equipment Recommendations  Other (comment) (defer to next venue)    Recommendations for Other Services       Precautions / Restrictions Precautions Precautions: Fall Restrictions Weight Bearing Restrictions: No      Mobility Bed Mobility Overal bed mobility: Needs Assistance Bed Mobility: Supine to Sit     Supine to sit: Mod assist   General bed mobility comments: cues for technique/sequencing. OT assisted with scooting hips to EOB and with trunk.  Transfers Overall transfer level: Needs assistance Equipment used: Rolling walker (2 wheeled) Transfers: Sit to/from [facility] Sit to Stand: Max assist Stand pivot transfers: Min assist       General transfer comment: Cues for technique. Heavy assist from bed.           ADL Overall ADL's : Needs assistance/impaired     Grooming: Wash/dry face;Sitting;Set up;Supervision/safety           Upper Body Dressing : Set up;Supervision/safety;Sitting   Lower Body Dressing: Moderate assistance;Sit to/from stand   Toilet Transfer: Minimal assistance;Stand-pivot;RW (chair)             General ADL Comments: Pt able to stand a few times from bed and march in place. Pt transferred from bed to chair. Pt sat EOB and washed face and OT assisted pt in donning socks. Pt leaning way back to try to get right sock on foot- OT providing Min guard-Min A for balance as well as assistance with sock. Pt able to manage left sock.     Vision                     Perception     Praxis      Pertinent Vitals/Pain Pain in LE's when positioning pillows under them. Repositioned.      Hand Dominance Right   Extremity/Trunk Assessment Upper Extremity Assessment Upper Extremity Assessment: Generalized weakness   Lower Extremity Assessment Lower Extremity Assessment: Defer to PT evaluation RLE Deficits / Details: edematous and red from thigh down to foot. Pt has difficulty moving extremity, knee ext 3-/5, hip flex 2+/5 LLE Deficits / Details: WFL     Communication Communication Communication: HOH   Cognition Arousal/Alertness: Awake/alert Behavior During Therapy: WFL for tasks assessed/performed Overall Cognitive Status: No family/caregiver present to determine baseline  cognitive functioning         General Comments       Exercises       Shoulder Instructions      Home Living Family/patient expects to be discharged to:: Skilled nursing facility                                        Prior Functioning/Environment Level of Independence: Needs assistance  Gait / Transfers Assistance Needed:  Per PT eval, pt states that he ambulates sometimes without AD but sometimes with cane. Pt confused though and realizes this. Unsure of  how current the status he is giving me is     Comments: Pt states he does his ADLs by himself    OT Diagnosis: Acute pain;Generalized weakness   OT Problem List: Decreased strength;Decreased activity tolerance;Decreased knowledge of use of DME or AE;Decreased knowledge of precautions;Pain;Impaired balance (sitting and/or standing)   OT Treatment/Interventions: Self-care/ADL training;Therapeutic exercise;DME and/or AE instruction;Therapeutic activities;Cognitive remediation/compensation;Patient/family education;Balance training    OT Goals(Current goals can be found in the care plan section) Acute Rehab OT Goals Patient Stated Goal: not stated OT Goal Formulation: With patient Time For Goal Achievement: 12/28/13 Potential to Achieve Goals: Good ADL Goals Pt Will Perform Grooming: with supervision;standing Pt Will Perform Lower Body Bathing: with supervision;with set-up;sit to/from stand Pt Will Perform Lower Body Dressing: with set-up;with supervision;sit to/from stand Pt Will Transfer to Toilet: with min guard assist;ambulating;bedside commode Pt Will Perform Toileting - Clothing Manipulation and hygiene: with supervision;sit to/from stand  OT Frequency: Min 2X/week   Barriers to D/C:            Co-evaluation              End of Session Equipment Utilized During Treatment: Gait belt;Rolling walker Nurse Communication: Mobility status  Activity Tolerance: Patient tolerated treatment well Patient left: in chair;with call bell/phone within reach;Other (comment) (tech on the way to get chair alarm)   Time: [phone] OT Time Calculation (min): 23 min Charges:  OT General Charges $OT Visit: 1 Procedure OT Evaluation $Initial OT Evaluation Tier I: 1 Procedure OT Treatments $Therapeutic Activity: 8-22 mins G-CodesBenito Lang OTR/L [phone] 12/21/2013, 5:40 PM

## 2013-12-21 NOTE — Evaluation (Signed)
Physical Therapy Evaluation Patient Details Name: [name] MRN: [id number] DOB: 1914-03-23 Today's Date: 12/21/2013   History of Present Illness  [name] is a 80 y.o. male who is brought in from his ALF after they reportedly found him slumped over there and generally weak with vomit around his mouth. He was brought in, has slight confusion / AMS in the ED. He states he feels generally weak, is not short of breath, feels like he is getting enough air, is not in pain anywhere, but further details are somewhat difficult due to confusion / difficulty hearing. Work up in ED is positive for mild AKI, sepsis with likely source being the cellulitis on his RLE. He is unsure how long the RLE has been cellulitic this time, though he has an extensive history of recurrent cellulitis of the RLE. PMH includes prostate cancer with metastasis to multiple sites, DM, CKD, and HOH.  Clinical Impression  Pt admitted with generalized weakness and AMS. Pt currently with functional limitations due to the deficits listed below (see PT Problem List). Pt performed sit to stand with mod A but unable to ambulate today.  Pt will benefit from skilled PT to increase their independence and safety with mobility to allow discharge to the venue listed below. PT will continue to follow.       Follow Up Recommendations SNF;Supervision/Assistance - 24 hour    Equipment Recommendations  None recommended by PT    Recommendations for Other Services OT consult     Precautions / Restrictions Precautions Precautions: Fall Restrictions Weight Bearing Restrictions: No      Mobility  Bed Mobility Overal bed mobility: Needs Assistance Bed Mobility: Supine to Sit;Sit to Supine     Supine to sit: Mod assist Sit to supine: Mod assist   General bed mobility comments: vc's for sequencing mvmt and hand over hand assist to reach for rail across body to initiate rolling. Mod A to roll to right, bring legs off bed and  push away from bed. Max A to scoot hips to EOB. Mod A to scoot hips back into bed to return to supine position. Mod A to get legs back into bed and [place] position needed to return pt to [facility]  Transfers Overall transfer level: Needs assistance Equipment used: None Transfers: Sit to/from Stand Sit to Stand: Mod assist         General transfer comment: mod A for wt-shift fwd and power up. Pt performed transfer twice and 2nd time was better but pt was exhausted with effort and discouraged that he could not stand on his own. Legs braced against bed, pt unable to step and fearful of falling  Ambulation/Gait             General Gait Details: NT today, pt too fatigued. Will require +2 assist  Stairs            Wheelchair Mobility    Modified Rankin (Stroke Patients Only)       Balance Overall balance assessment: Needs assistance Sitting-balance support: Bilateral upper extremity supported;Feet supported Sitting balance-Leahy Scale: Fair Sitting balance - Comments: left gaze preference in sitting   Standing balance support: Bilateral upper extremity supported;During functional activity Standing balance-Leahy Scale: Poor Standing balance comment: requires mod A to maintain static standing                             Pertinent Vitals/Pain No c/o pain  Home Living Family/patient expects to be discharged to:: Skilled nursing facility                      Prior Function Level of Independence: Needs assistance   Gait / Transfers Assistance Needed: pt states that he ambulates sometimes without AD but sometimes with cane. Pt confused though and realizes this. Unsure of how current the status he is giving me is     Comments: pt states he does all adls and ambulates without device, says he has never been like this     Hand Dominance   Dominant Hand: Right    Extremity/Trunk Assessment   Upper Extremity Assessment: Defer to OT  evaluation;Generalized weakness           Lower Extremity Assessment: RLE deficits/detail;LLE deficits/detail RLE Deficits / Details: edematous and red from thigh down to foot. Pt has difficulty moving extremity, knee ext 3-/5, hip flex 2+/5 LLE Deficits / Details: WFL  Cervical / Trunk Assessment: Kyphotic  Communication   Communication: HOH  Cognition Arousal/Alertness: Lethargic Behavior During Therapy: Anxious Overall Cognitive Status: Impaired/Different from baseline Area of Impairment: Memory;Orientation;Problem solving Orientation Level: Time;Disoriented to   Memory: Decreased short-term memory       Problem Solving: Requires verbal cues;Difficulty sequencing;Slow processing;Requires tactile cues General Comments: pt is frustrated because he realizes that he is not himself and that he is not remembering things and said repeatedly that he has never been like this and he doesn't remember coming into hospital    General Comments General comments (skin integrity, edema, etc.): pt with visual deficits    Exercises        Assessment/Plan    PT Assessment Patient needs continued PT services  PT Diagnosis Difficulty walking;Generalized weakness   PT Problem List Decreased strength;Decreased range of motion;Decreased activity tolerance;Decreased balance;Decreased mobility;Decreased cognition;Decreased knowledge of use of DME;Decreased knowledge of precautions  PT Treatment Interventions DME instruction;Gait training;Functional mobility training;Therapeutic activities;Therapeutic exercise;Balance training;Neuromuscular re-education;Cognitive remediation;Patient/family education   PT Goals (Current goals can be found in the Care Plan section) Acute Rehab PT Goals Patient Stated Goal: get better PT Goal Formulation: With patient Time For Goal Achievement: 01/04/14 Potential to Achieve Goals: Good    Frequency Min 3X/week   Barriers to discharge        Co-evaluation                End of Session   Activity Tolerance: Patient limited by fatigue Patient left: in bed;with bed alarm set;with call bell/phone within reach Nurse Communication: Mobility status         Time: [id number] PT Time Calculation (min): 24 min   Charges:   PT Evaluation $Initial PT Evaluation Tier I: 1 Procedure PT Treatments $Therapeutic Activity: 23-37 mins   PT G Codes:        Leighton Roach, PT  Acute Rehab Services  [place], [place] 12/21/2013, 1:53 PM

## 2013-12-21 NOTE — Progress Notes (Signed)
[facility] PROGRESS NOTE  [name]:[id number] DOB: 03-26-1914 DOA: 12/20/2013 PCP: Gara Kroner, MD  Assessment/Plan: Cellulitis of right lower extremity with sepsis gram-positive cocci -Continue empiric antibiotics with vancomycin and Zosyn pending culture ID and sensitivity - Will obtain 2-D echo given the gram-positive bacteremia>> follow and consult ID for further recommendations -BP stable but WBC trending, follow and recheck Active Problems:  HTN (hypertension)  DM (diabetes mellitus)  AKI (acute kidney injury) -Creatinine about the same, continue hydration follow and recheck  Lactic acidosis -Resolved with management as above  Code Status: DO NOT RESUSCITATE Family Communication: None at bedside  Disposition Plan: Pending clinical course   Consultants:  None  Procedures:  None  Antibiotics:  Vancomycin and Zosyn>> started on 7/26  HPI/Subjective: Denies pain, states his right leg as been that way for a long time.  Objective: Filed Vitals:   12/21/13 0524  BP: 119/57  Pulse: 103  Temp: 98.2 F (36.8 C)  Resp: 20    Intake/Output Summary (Last 24 hours) at 12/21/13 1353 Last data filed at 12/21/13 0900  Gross per 24 hour  Intake   1797 ml  Output    100 ml  Net   1697 ml   Filed Weights   12/20/13 0030 12/20/13 0415 12/20/13 1304  Weight: 81.647 kg (180 lb) 83.8 kg (184 lb 11.9 oz) 84.6 kg (186 lb 8.2 oz)    Exam:  General: alert & oriented x 2 In NAD Cardiovascular: RRR, nl S1 s2 Respiratory: CTAB Abdomen: soft +BS NT/ND, no masses palpable Extremities: Right ower extremity with decreasing erythema, edematous areas on chronic skin changes. Nontender    Data Reviewed: Basic Metabolic Panel:  Recent Labs Lab 12/20/13 0026 12/20/13 0042 12/20/13 0303 12/21/13 0614  NA 138  --  139 140  K 4.5  --  4.2 4.0  CL 96  --  97 102  CO2 24  --  25 23  GLUCOSE 214*  --  177* 95  BUN 33*  --  33* 37*  CREATININE 1.94*  --   1.87* 1.90*  CALCIUM 9.5  --  8.7 8.5  MG  --  2.1  --   --    Liver Function Tests:  Recent Labs Lab 12/20/13 0026  AST 17  ALT 13  ALKPHOS 100  BILITOT 0.6  PROT 6.5  ALBUMIN 3.7    Recent Labs Lab 12/20/13 0026  LIPASE 13   No results found for this basename: AMMONIA,  in the last 168 hours CBC:  Recent Labs Lab 12/20/13 0026 12/20/13 0303 12/21/13 0614  WBC 12.0* 20.1* 25.3*  NEUTROABS 9.8*  --   --   HGB 14.4 12.3* 12.0*  HCT 42.4 37.8* 35.5*  MCV 92.6 92.6 93.2  PLT 129* 122* 110*   Cardiac Enzymes: No results found for this basename: CKTOTAL, CKMB, CKMBINDEX, TROPONINI,  in the last 168 hours BNP (last 3 results) No results found for this basename: PROBNP,  in the last 8760 hours CBG:  Recent Labs Lab 12/21/13 0120 12/21/13 0201 12/21/13 0547 12/21/13 0747 12/21/13 1152  GLUCAP 64* 113* 129* 95 158*    Recent Results (from the past 240 hour(s))  CULTURE, BLOOD (ROUTINE X 2)     Status: None   Collection Time    12/20/13 12:55 AM      Result Value Ref Range Status   Specimen Description BLOOD LEFT ARM   Final   Special Requests BOTTLES DRAWN AEROBIC AND ANAEROBIC [place]  Final   Culture  Setup Time     Final   Value: 12/20/2013 14:51     Performed at [facility]   Culture     Final   Value: GRAM POSITIVE COCCI IN CHAINS     Note: Gram Stain Report Called to,Read Back By and Verified With: RACHEL MUTHOMI RN @1015PM  [place]      Performed at [facility]   Report Status PENDING   Incomplete  CULTURE, BLOOD (ROUTINE X 2)     Status: None   Collection Time    12/20/13  1:00 AM      Result Value Ref Range Status   Specimen Description BLOOD LEFT HAND   Final   Special Requests BOTTLES DRAWN AEROBIC ONLY 4CC   Final   Culture  Setup Time     Final   Value: 12/20/2013 14:51     Performed at [facility]   Culture     Final   Value: GRAM POSITIVE COCCI IN CHAINS     Note: Gram Stain Report Called to,Read Back By  and Verified With: [facility] 12/20/13 @ 9:30PM BY RUSCOE A.     Performed at [facility]   Report Status PENDING   Incomplete  MRSA PCR SCREENING     Status: Abnormal   Collection Time    12/20/13  4:36 AM      Result Value Ref Range Status   MRSA by PCR POSITIVE (*) NEGATIVE Final   Comment:            The GeneXpert MRSA Assay (FDA     approved for NASAL specimens     only), is one component of a     comprehensive MRSA colonization     surveillance program. It is not     intended to diagnose MRSA     infection nor to guide or     monitor treatment for     MRSA infections.     RESULT CALLED TO, READ BACK BY AND VERIFIED WITH:     CALLED TO RN Larene Beach LOVE [phone] @0644  THANEY     Studies: Ct Head Wo Contrast  12/20/2013   CLINICAL DATA:  Altered mental status.  EXAM: CT HEAD WITHOUT CONTRAST  TECHNIQUE: Contiguous axial images were obtained from the base of the skull through the vertex without intravenous contrast.  COMPARISON:  None.  FINDINGS: There is no evidence of acute infarction, mass lesion, or intra- or extra-axial hemorrhage on CT.  Prominence of the ventricles and sulci reflects moderately severe cortical volume loss. Cerebellar atrophy is noted. Scattered periventricular and subcortical white matter likely reflects small vessel ischemic microangiopathy. A small chronic lacunar infarct is seen at the left thalamus.  The brainstem and fourth ventricle are within normal limits. The cerebral hemispheres demonstrate grossly normal gray-white differentiation. No mass effect or midline shift is seen.  There is no evidence of fracture; visualized osseous structures are unremarkable in appearance. The orbits are within normal limits. The paranasal sinuses and mastoid air cells are well-aerated. No significant soft tissue abnormalities are seen.  IMPRESSION: 1. No acute intracranial pathology seen on CT. 2. Moderately severe cortical volume loss and scattered small vessel  ischemic microangiopathy. Small chronic lacunar infarct at the left thalamus.   Electronically Signed   By: Garald Balding M.D.   On: 12/20/2013 01:54   Dg Chest Port 1 View  12/20/2013   CLINICAL DATA:  Altered mental status.  EXAM: PORTABLE  CHEST - 1 VIEW  COMPARISON:  12/18/2012.  FINDINGS: 0138 hrs. The cardio pericardial silhouette is enlarged. Interstitial markings are diffusely coarsened with chronic features. No edema or focal airspace consolidation. No evidence for pleural effusion. Telemetry leads overlie the chest.  IMPRESSION: Low volumes with cardiomegaly.  No acute cardiopulmonary findings.   Electronically Signed   By: Misty Stanley M.D.   On: 12/20/2013 02:05    Scheduled Meds: . aspirin  81 mg Oral QHS  . bicalutamide  50 mg Oral Daily  . Chlorhexidine Gluconate Cloth  6 each Topical Q0600  . feeding supplement (GLUCERNA SHAKE)  237 mL Oral BID BM  . fluticasone  1 spray Each Nare BID  . heparin  5,000 Units Subcutaneous 3 times per day  . hydrocerin  1 application Topical BID  . insulin aspart  0-9 Units Subcutaneous TID WC  . levothyroxine  125 mcg Oral QAC breakfast  . mupirocin ointment  1 application Nasal BID  . piperacillin-tazobactam (ZOSYN)  IV  3.375 g Intravenous Q8H  . senna-docusate  1 tablet Oral BID  . tamsulosin  0.4 mg Oral Daily  . [START ON 12/22/2013] vancomycin  1,000 mg Intravenous Q48H   Continuous Infusions: . sodium chloride 100 mL/hr at 12/21/13 0532    Principal Problem:   Cellulitis of right lower extremity Active Problems:   HTN (hypertension)   DM (diabetes mellitus)   Severe sepsis with acute organ dysfunction   AKI (acute kidney injury)   Lactic acidosis    Time spent: [facility] Pager [phone] If 7PM-7AM, please contact night-coverage at [email or web address], password [facility] 12/21/2013, 1:53 PM  LOS: 1 day

## 2013-12-22 DIAGNOSIS — N183 Chronic kidney disease, stage 3 unspecified: Secondary | ICD-10-CM | POA: Diagnosis present

## 2013-12-22 DIAGNOSIS — B951 Streptococcus, group B, as the cause of diseases classified elsewhere: Secondary | ICD-10-CM

## 2013-12-22 DIAGNOSIS — R7881 Bacteremia: Secondary | ICD-10-CM

## 2013-12-22 DIAGNOSIS — N189 Chronic kidney disease, unspecified: Secondary | ICD-10-CM

## 2013-12-22 DIAGNOSIS — Z85820 Personal history of malignant melanoma of skin: Secondary | ICD-10-CM

## 2013-12-22 DIAGNOSIS — H9190 Unspecified hearing loss, unspecified ear: Secondary | ICD-10-CM | POA: Diagnosis present

## 2013-12-22 LAB — BASIC METABOLIC PANEL
ANION GAP: 12 (ref 5–15)
BUN: 30 mg/dL — AB (ref 6–23)
CHLORIDE: 106 meq/L (ref 96–112)
CO2: 21 mEq/L (ref 19–32)
Calcium: 8.8 mg/dL (ref 8.4–10.5)
Creatinine, Ser: 1.79 mg/dL — ABNORMAL HIGH (ref 0.50–1.35)
GFR calc non Af Amer: 30 mL/min — ABNORMAL LOW (ref >90)
GFR, EST AFRICAN AMERICAN: 35 mL/min — AB (ref >90)
Glucose, Bld: 153 mg/dL — ABNORMAL HIGH (ref 70–99)
POTASSIUM: 4.2 meq/L (ref 3.7–5.3)
Sodium: 139 mEq/L (ref 137–147)

## 2013-12-22 LAB — CBC
HEMATOCRIT: 35.5 % — AB (ref 39.0–52.0)
Hemoglobin: 11.6 g/dL — ABNORMAL LOW (ref 13.0–17.0)
MCH: 30.9 pg (ref 26.0–34.0)
MCHC: 32.7 g/dL (ref 30.0–36.0)
MCV: 94.4 fL (ref 78.0–100.0)
Platelets: 133 10*3/uL — ABNORMAL LOW (ref 150–400)
RBC: 3.76 MIL/uL — AB (ref 4.22–5.81)
RDW: 14.1 % (ref 11.5–15.5)
WBC: 19.7 10*3/uL — AB (ref 4.0–10.5)

## 2013-12-22 LAB — GLUCOSE, CAPILLARY
GLUCOSE-CAPILLARY: 217 mg/dL — AB (ref 70–99)
GLUCOSE-CAPILLARY: 180 mg/dL — AB (ref 70–99)
Glucose-Capillary: 152 mg/dL — ABNORMAL HIGH (ref 70–99)
Glucose-Capillary: 219 mg/dL — ABNORMAL HIGH (ref 70–99)

## 2013-12-22 NOTE — Clinical Social Work Psychosocial (Signed)
Clinical Social Work Department BRIEF PSYCHOSOCIAL ASSESSMENT 12/22/2013  Patient:  [name], [name]     Account Number:  [id number]     Admit date:  12/20/2013  Clinical Social Worker:  Delrae Sawyers  Date/Time:  12/22/2013 02:40 PM  Referred by:  Physician  Date Referred:  12/22/2013 Referred for  SNF Placement   Other Referral:   none.   Interview type:  Family Other interview type:   CSW spoke with pt's daughter, [name], regarding discharge disposition.    PSYCHOSOCIAL DATA Living Status:  FACILITY Admitted from facility:  Other Level of care:  Assisted Living Primary support name:  [name] Primary support relationship to patient:  CHILD, ADULT Degree of support available:   Strong support system.    CURRENT CONCERNS Current Concerns  Post-Acute Placement   Other Concerns:   none.    SOCIAL WORK ASSESSMENT / PLAN CSW received consult regarding pt from [place]. CSW contacted pt's daughter, [name], regarding discharge recommendation for SNF. Per pt's daughter, [name]F will accept pt back once medically stable for discharge.    CSW to continue to follow and assist with discharge planning.   Assessment/plan status:  Psychosocial Support/Ongoing Assessment of Needs Other assessment/ plan:   none.   Information/referral to community resources:   Pt to return to [name].    PATIENT'S/FAMILY'S RESPONSE TO PLAN OF CARE: Pt's family understanding and agreeable to CSW plan of care. Pt's family expressed no further questions or concerns at this time.       Lubertha Sayres, MSW, [facility] Licensed Clinical Social Worker [phone] and [phone]

## 2013-12-22 NOTE — Progress Notes (Signed)
[facility] PROGRESS NOTE  SAVIEN MAMULA IOX:[id number] DOB: 1913-10-20 DOA: 12/20/2013 PCP: Gara Kroner, MD  Assessment/Plan: Cellulitis of right lower extremity with group B strep sepsis -Was started on empiric antibiotics with vancomycin and Zosyn pending culture ID and sensitivity>> group B strep  -ID consulted, discussed patient with Dr. Megan Salon and he recommends to DC Zosyn, he'll see patient for further recommendations - Await 2-D echo given the gram-positive bacteremia -Improving, BP stable and WBC beginning to trend down Active Problems:  HTN (hypertension) -Stable/controlled on no meds at this time, follow and further treat accordingly  DM (diabetes mellitus) -Continue SSI  Acute on CKD stage III daily -Creatinine beginning to trend down Lactic acidosis -Resolved with management as above  Code Status: DO NOT RESUSCITATE Family Communication: None at bedside  Disposition Plan: Pending clinical course   Consultants:  None  Procedures:  None  Antibiotics:   Zosyn>> started on 7/26>>7/28  Vancomycin 7/26>>  HPI/Subjective: Complaining of pain all over, and shortness of breath  Objective: Filed Vitals:   12/22/13 0608  BP: 132/72  Pulse: 113  Temp: 98 F (36.7 C)  Resp: 20    Intake/Output Summary (Last 24 hours) at 12/22/13 0954 Last data filed at 12/22/13 0616  Gross per 24 hour  Intake   2837 ml  Output   1450 ml  Net   1387 ml   Filed Weights   12/20/13 0030 12/20/13 0415 12/20/13 1304  Weight: 81.647 kg (180 lb) 83.8 kg (184 lb 11.9 oz) 84.6 kg (186 lb 8.2 oz)    Exam:  General: alert & oriented x 2 In NAD Cardiovascular: RRR, nl S1 s2 Respiratory: CTAB Abdomen: soft +BS NT/ND, no masses palpable Extremities: Right ower extremity with decreasing erythema, edematous areas on chronic skin changes. Mild tenderness    Data Reviewed: Basic Metabolic Panel:  Recent Labs Lab 12/20/13 0026 12/20/13 0042 12/20/13 0303  12/21/13 0614 12/22/13 0413  NA 138  --  139 140 139  K 4.5  --  4.2 4.0 4.2  CL 96  --  97 102 106  CO2 24  --  25 23 21   GLUCOSE 214*  --  177* 95 153*  BUN 33*  --  33* 37* 30*  CREATININE 1.94*  --  1.87* 1.90* 1.79*  CALCIUM 9.5  --  8.7 8.5 8.8  MG  --  2.1  --   --   --    Liver Function Tests:  Recent Labs Lab 12/20/13 0026  AST 17  ALT 13  ALKPHOS 100  BILITOT 0.6  PROT 6.5  ALBUMIN 3.7    Recent Labs Lab 12/20/13 0026  LIPASE 13   No results found for this basename: AMMONIA,  in the last 168 hours CBC:  Recent Labs Lab 12/20/13 0026 12/20/13 0303 12/21/13 0614 12/22/13 0413  WBC 12.0* 20.1* 25.3* 19.7*  NEUTROABS 9.8*  --   --   --   HGB 14.4 12.3* 12.0* 11.6*  HCT 42.4 37.8* 35.5* 35.5*  MCV 92.6 92.6 93.2 94.4  PLT 129* 122* 110* 133*   Cardiac Enzymes: No results found for this basename: CKTOTAL, CKMB, CKMBINDEX, TROPONINI,  in the last 168 hours BNP (last 3 results) No results found for this basename: PROBNP,  in the last 8760 hours CBG:  Recent Labs Lab 12/21/13 0747 12/21/13 1152 12/21/13 1634 12/21/13 2230 12/22/13 0754  GLUCAP 95 158* 214* 167* 152*    Recent Results (from the past 240 hour(s))  CULTURE, BLOOD (  ROUTINE X 2)     Status: None   Collection Time    12/20/13 12:55 AM      Result Value Ref Range Status   Specimen Description BLOOD LEFT ARM   Final   Special Requests BOTTLES DRAWN AEROBIC AND ANAEROBIC 10CC EACH   Final   Culture  Setup Time     Final   Value: 12/20/2013 14:51     Performed at [facility]   Culture     Final   Value: GRAM POSITIVE COCCI IN CHAINS     Note: Gram Stain Report Called to,Read Back By and Verified With: RACHEL MUTHOMI RN @1015PM  [place]      Performed at [facility]   Report Status PENDING   Incomplete  CULTURE, BLOOD (ROUTINE X 2)     Status: None   Collection Time    12/20/13  1:00 AM      Result Value Ref Range Status   Specimen Description BLOOD LEFT HAND    Final   Special Requests BOTTLES DRAWN AEROBIC ONLY 4CC   Final   Culture  Setup Time     Final   Value: 12/20/2013 14:51     Performed at [facility]   Culture     Final   Value: GRAM POSITIVE COCCI IN CHAINS     Note: Gram Stain Report Called to,Read Back By and Verified With: [facility] 12/20/13 @ 9:30PM BY RUSCOE A.     Performed at [facility]   Report Status PENDING   Incomplete  MRSA PCR SCREENING     Status: Abnormal   Collection Time    12/20/13  4:36 AM      Result Value Ref Range Status   MRSA by PCR POSITIVE (*) NEGATIVE Final   Comment:            The GeneXpert MRSA Assay (FDA     approved for NASAL specimens     only), is one component of a     comprehensive MRSA colonization     surveillance program. It is not     intended to diagnose MRSA     infection nor to guide or     monitor treatment for     MRSA infections.     RESULT CALLED TO, READ BACK BY AND VERIFIED WITH:     CALLED TO RN Larene Beach LOVE [phone] @0644  THANEY     Studies: No results found.  Scheduled Meds: . aspirin  81 mg Oral QHS  . bicalutamide  50 mg Oral Daily  . Chlorhexidine Gluconate Cloth  6 each Topical Q0600  . feeding supplement (GLUCERNA SHAKE)  237 mL Oral BID BM  . fluticasone  1 spray Each Nare BID  . heparin  5,000 Units Subcutaneous 3 times per day  . hydrocerin  1 application Topical BID  . insulin aspart  0-9 Units Subcutaneous TID WC  . levothyroxine  125 mcg Oral QAC breakfast  . mupirocin ointment  1 application Nasal BID  . senna-docusate  1 tablet Oral BID  . tamsulosin  0.4 mg Oral Daily  . vancomycin  1,000 mg Intravenous Q48H   Continuous Infusions: . sodium chloride 100 mL/hr at 12/22/13 7425    Principal Problem:   Cellulitis of right lower extremity Active Problems:   HTN (hypertension)   DM (diabetes mellitus)   Severe sepsis with acute organ dysfunction   AKI (acute kidney injury)   Lactic acidosis  Time spent:  [facility] Pager [phone] If 7PM-7AM, please contact night-coverage at [email or web address], password [facility] 12/22/2013, 9:54 AM  LOS: 2 days

## 2013-12-22 NOTE — Consult Note (Signed)
[facility] for Infectious Disease    Date of Admission:  12/20/2013   Total days of antibiotics 4               Reason for Consult: Recurrent right leg cellulitis complicated by group B streptococcal bacteremia    Referring Physician: Dr. Minette Headland   Principal Problem:   Recurrent cellulitis of lower leg Active Problems:   Bacteremia due to group B Streptococcus   HTN (hypertension)   DM (diabetes mellitus)   Severe sepsis with acute organ dysfunction   Lactic acidosis   CRI (chronic renal insufficiency)   H/O malignant melanoma of skin   Hearing loss   . aspirin  81 mg Oral QHS  . bicalutamide  50 mg Oral Daily  . Chlorhexidine Gluconate Cloth  6 each Topical Q0600  . feeding supplement (GLUCERNA SHAKE)  237 mL Oral BID BM  . fluticasone  1 spray Each Nare BID  . heparin  5,000 Units Subcutaneous 3 times per day  . hydrocerin  1 application Topical BID  . insulin aspart  0-9 Units Subcutaneous TID WC  . levothyroxine  125 mcg Oral QAC breakfast  . mupirocin ointment  1 application Nasal BID  . senna-docusate  1 tablet Oral BID  . tamsulosin  0.4 mg Oral Daily  . vancomycin  1,000 mg Intravenous Q48H    Recommendations: 1. Continue vancomycin for now but consider conversion to oral amoxicillin 500 mg by mouth twice daily soon and complete 10 more days of therapy  Assessment: He is improving on therapy for recurrent right leg cellulitis and group B streptococcal bacteremia. He has no apparent complications and is a good candidate for conversion to oral amoxicillin soon. I do not think that he needs to have a PICC and continued IV antibiotic therapy after discharge.    HPI: [name] is a 80 y.o. male with a history of 15 surgeries on his right lower leg for melanoma and recurrent cellulitis. He was found lethargic and confused at his assisted living facility and was admitted 4 days ago. He had fever and acute cellulitis of his right lower  leg. Both admission blood cultures grew group B strep. He has no recall of admission but is feeling better now.   Review of Systems: Review of systems not obtained due to patient factors.  Past Medical History  Diagnosis Date  . Prostate cancer   . Hypothyroidism   . Hyperglycemia   . DMII (diabetes mellitus, type 2)   . Prostate cancer, primary, with metastasis from prostate to other site 12/06/2011  . Prostate cancer metastatic to multiple sites   . A-fib   . CKD (chronic kidney disease)     History  Substance Use Topics  . Smoking status: Never Smoker   . Smokeless tobacco: Never Used  . Alcohol Use: No    Family History  Problem Relation Age of Onset  . CAD Father 38   Allergies  Allergen Reactions  . Accupril [Quinapril Hcl] Other (See Comments)    unknown  . Naproxen Rash    OBJECTIVE: Blood pressure 125/81, pulse 109, temperature 98.1 F (36.7 C), temperature source Oral, resp. rate 20, height 6\' 2"  (1.88 m), weight 84.6 kg (186 lb 8.2 oz), SpO2 100.00%. General: He is alert and in no distress. He is very hard of hearing Skin: He has erythema of his right lower leg. He has a large central surgical defect  over his anterior shin and multiple chronic nodules. Oral: Multiple broken and missing teeth Lungs: Clear Cor: Regular S1 and S2 with no murmurs. He is tachycardic Abdomen: Soft and nontender   Lab Results Lab Results  Component Value Date   WBC 19.7* 12/22/2013   HGB 11.6* 12/22/2013   HCT 35.5* 12/22/2013   MCV 94.4 12/22/2013   PLT 133* 12/22/2013    Lab Results  Component Value Date   CREATININE 1.79* 12/22/2013   BUN 30* 12/22/2013   NA 139 12/22/2013   K 4.2 12/22/2013   CL 106 12/22/2013   CO2 21 12/22/2013    Lab Results  Component Value Date   ALT 13 12/20/2013   AST 17 12/20/2013   ALKPHOS 100 12/20/2013   BILITOT 0.6 12/20/2013     Microbiology: Recent Results (from the past 240 hour(s))  CULTURE, BLOOD (ROUTINE X 2)     Status: None    Collection Time    12/20/13 12:55 AM      Result Value Ref Range Status   Specimen Description BLOOD LEFT ARM   Final   Special Requests BOTTLES DRAWN AEROBIC AND ANAEROBIC 10CC EACH   Final   Culture  Setup Time     Final   Value: 12/20/2013 14:51     Performed at [facility]   Culture     Final   Value: GROUP B STREP(S.AGALACTIAE)ISOLATED     Note: Gram Stain Report Called to,Read Back By and Verified With: RACHEL MUTHOMI RN @1015PM  [place]      Performed at [facility]   Report Status PENDING   Incomplete  CULTURE, BLOOD (ROUTINE X 2)     Status: None   Collection Time    12/20/13  1:00 AM      Result Value Ref Range Status   Specimen Description BLOOD LEFT HAND   Final   Special Requests BOTTLES DRAWN AEROBIC ONLY 4CC   Final   Culture  Setup Time     Final   Value: 12/20/2013 14:51     Performed at [facility]   Culture     Final   Value: GROUP B STREP(S.AGALACTIAE)ISOLATED     Note: Gram Stain Report Called to,Read Back By and Verified With: [facility] 12/20/13 @ 9:30PM BY RUSCOE A.     Performed at [facility]   Report Status PENDING   Incomplete  MRSA PCR SCREENING     Status: Abnormal   Collection Time    12/20/13  4:36 AM      Result Value Ref Range Status   MRSA by PCR POSITIVE (*) NEGATIVE Final   Comment:            The GeneXpert MRSA Assay (FDA     approved for NASAL specimens     only), is one component of a     comprehensive MRSA colonization     surveillance program. It is not     intended to diagnose MRSA     infection nor to guide or     monitor treatment for     MRSA infections.     RESULT CALLED TO, READ BACK BY AND VERIFIED WITH:     CALLED TO RN Larene Beach LOVE [phone] @0644  Ventura Bruns, MD [facility] [phone] pager   [phone] cell 12/22/2013, 5:05 PM

## 2013-12-22 NOTE — Clinical Documentation Improvement (Signed)
Please clarify stage CKD. Thank you!  Possible Clinical Conditions?   CKD Stage I - GFR > OR = 90 CKD Stage II - GFR 60-80 CKD Stage III - GFR 30-59 CKD Stage IV - GFR 15-29 CKD Stage V - GFR < 15 ESRD (End Stage Renal Disease) Other condition_____________ Cannot Clinically determine   Supporting Information:  Risk Factors:  History of CKD Active Diagnosis 7/26 Acute on CKD  Diagnostics:  BUN/CR/GFR: 05/25/13 = 34/1.33/39 12/20/13 = @ 0026 33/1.94/27; @0303  33/1.87/28 12/21/13 = 37/1.90/28 12/22/13 = 30/1.79/30   Treatment: Strict I&O  Thank You, Estella Husk ,RN Clinical Documentation Specialist:  [place] Information Management

## 2013-12-22 NOTE — Clinical Social Work Note (Signed)
CSW has left message with pt's daughter, Michele Rockers, regarding discharge disposition. CSW will continue to follow and assist with discharge planning.  Lubertha Sayres, MSW, [facility] Licensed Clinical Social Worker [phone] and [phone]

## 2013-12-23 DIAGNOSIS — I369 Nonrheumatic tricuspid valve disorder, unspecified: Secondary | ICD-10-CM

## 2013-12-23 LAB — CULTURE, BLOOD (ROUTINE X 2)

## 2013-12-23 LAB — BASIC METABOLIC PANEL
Anion gap: 15 (ref 5–15)
BUN: 22 mg/dL (ref 6–23)
CALCIUM: 8.6 mg/dL (ref 8.4–10.5)
CHLORIDE: 105 meq/L (ref 96–112)
CO2: 20 meq/L (ref 19–32)
Creatinine, Ser: 1.52 mg/dL — ABNORMAL HIGH (ref 0.50–1.35)
GFR calc Af Amer: 42 mL/min — ABNORMAL LOW (ref >90)
GFR calc non Af Amer: 36 mL/min — ABNORMAL LOW (ref >90)
Glucose, Bld: 156 mg/dL — ABNORMAL HIGH (ref 70–99)
Potassium: 4.1 mEq/L (ref 3.7–5.3)
SODIUM: 140 meq/L (ref 137–147)

## 2013-12-23 LAB — GLUCOSE, CAPILLARY
GLUCOSE-CAPILLARY: 187 mg/dL — AB (ref 70–99)
Glucose-Capillary: 206 mg/dL — ABNORMAL HIGH (ref 70–99)
Glucose-Capillary: 205 mg/dL — ABNORMAL HIGH (ref 70–99)

## 2013-12-23 LAB — CBC
HCT: 35 % — ABNORMAL LOW (ref 39.0–52.0)
Hemoglobin: 11.3 g/dL — ABNORMAL LOW (ref 13.0–17.0)
MCH: 30.8 pg (ref 26.0–34.0)
MCHC: 32.3 g/dL (ref 30.0–36.0)
MCV: 95.4 fL (ref 78.0–100.0)
PLATELETS: 154 10*3/uL (ref 150–400)
RBC: 3.67 MIL/uL — ABNORMAL LOW (ref 4.22–5.81)
RDW: 14.1 % (ref 11.5–15.5)
WBC: 14.6 10*3/uL — AB (ref 4.0–10.5)

## 2013-12-23 MED ORDER — GLIPIZIDE 10 MG PO TABS
10.0000 mg | ORAL_TABLET | Freq: Two times a day (BID) | ORAL | Status: DC
  Administered 2013-12-23 – 2013-12-25 (×3): 10 mg via ORAL
  Filled 2013-12-23 (×6): qty 1

## 2013-12-23 MED ORDER — AMOXICILLIN 500 MG PO CAPS
500.0000 mg | ORAL_CAPSULE | Freq: Three times a day (TID) | ORAL | Status: DC
  Administered 2013-12-23 – 2013-12-25 (×7): 500 mg via ORAL
  Filled 2013-12-23 (×11): qty 1

## 2013-12-23 MED ORDER — METOPROLOL TARTRATE 50 MG PO TABS
50.0000 mg | ORAL_TABLET | Freq: Two times a day (BID) | ORAL | Status: DC
  Administered 2013-12-23 – 2013-12-25 (×3): 50 mg via ORAL
  Filled 2013-12-23 (×5): qty 1

## 2013-12-23 MED ORDER — DILTIAZEM HCL ER COATED BEADS 180 MG PO CP24
180.0000 mg | ORAL_CAPSULE | Freq: Every day | ORAL | Status: DC
  Administered 2013-12-23 – 2013-12-25 (×3): 180 mg via ORAL
  Filled 2013-12-23 (×3): qty 1

## 2013-12-23 NOTE — Progress Notes (Addendum)
[facility] PROGRESS NOTE  [name] QBH:[id number] DOB: Jun 23, 1913 DOA: 12/20/2013 PCP: Gara Kroner, MD  Assessment/Plan: Cellulitis of right lower extremity with group B strep sepsis -Was started on empiric antibiotics with vancomycin and Zosyn pending culture ID and sensitivity>> group B strep  -ID consulted- Dr Megan Salon has switched antibiotics to Amoxicillin and recommends 14 days of therapy - Await 2-D echo given the gram-positive bacteremia   Active Problems:  HTN (hypertension) -Stable/controlled on no meds at this time, follow and further treat accordingly   DM (diabetes mellitus) - resume Glipizide -Continue SSI   Acute on CKD stage III  -Creatinine beginning to trend down  Lactic acidosis -Resolved with management as above  Code Status: DO NOT RESUSCITATE Family Communication: None at bedside  Disposition Plan: Pending clinical course- PT recommends SNF- his AL facility will see him and let us know if they can take him back and do HHPT   Consultants:  None  Procedures:  None  Antibiotics:   Zosyn>> started on 7/26>>7/28  Vancomycin 7/26>>7/29  Amoxicillin 7/29  HPI/Subjective: No complaints today  Objective: Filed Vitals:   12/23/13 1156  BP:   Pulse: 118  Temp:   Resp:     Intake/Output Summary (Last 24 hours) at 12/23/13 1821 Last data filed at 12/23/13 1743  Gross per 24 hour  Intake   1978 ml  Output    875 ml  Net   1103 ml   Filed Weights   12/20/13 0030 12/20/13 0415 12/20/13 1304  Weight: 81.647 kg (180 lb) 83.8 kg (184 lb 11.9 oz) 84.6 kg (186 lb 8.2 oz)    Exam:  General: alert & oriented x 2 In NAD Cardiovascular: RRR, nl S1 s2 Respiratory: CTAB Abdomen: soft +BS NT/ND, no masses palpable Extremities: Right ower extremity with decreasing erythema, edematous areas on chronic skin changes. Mild tenderness    Data Reviewed: Basic Metabolic Panel:  Recent Labs Lab 12/20/13 0026 12/20/13 0042  12/20/13 0303 12/21/13 0614 12/22/13 0413 12/23/13 0502  NA 138  --  139 140 139 140  K 4.5  --  4.2 4.0 4.2 4.1  CL 96  --  97 102 106 105  CO2 24  --  25 23 21 20   GLUCOSE 214*  --  177* 95 153* 156*  BUN 33*  --  33* 37* 30* 22  CREATININE 1.94*  --  1.87* 1.90* 1.79* 1.52*  CALCIUM 9.5  --  8.7 8.5 8.8 8.6  MG  --  2.1  --   --   --   --    Liver Function Tests:  Recent Labs Lab 12/20/13 0026  AST 17  ALT 13  ALKPHOS 100  BILITOT 0.6  PROT 6.5  ALBUMIN 3.7    Recent Labs Lab 12/20/13 0026  LIPASE 13   No results found for this basename: AMMONIA,  in the last 168 hours CBC:  Recent Labs Lab 12/20/13 0026 12/20/13 0303 12/21/13 0614 12/22/13 0413 12/23/13 0502  WBC 12.0* 20.1* 25.3* 19.7* 14.6*  NEUTROABS 9.8*  --   --   --   --   HGB 14.4 12.3* 12.0* 11.6* 11.3*  HCT 42.4 37.8* 35.5* 35.5* 35.0*  MCV 92.6 92.6 93.2 94.4 95.4  PLT 129* 122* 110* 133* 154   Cardiac Enzymes: No results found for this basename: CKTOTAL, CKMB, CKMBINDEX, TROPONINI,  in the last 168 hours BNP (last 3 results) No results found for this basename: PROBNP,  in the last 8760 hours  CBG:  Recent Labs Lab 12/22/13 1708 12/22/13 2228 12/23/13 0756 12/23/13 1227 12/23/13 1658  GLUCAP 217* 180* 187* 206* 205*    Recent Results (from the past 240 hour(s))  CULTURE, BLOOD (ROUTINE X 2)     Status: None   Collection Time    12/20/13 12:55 AM      Result Value Ref Range Status   Specimen Description BLOOD LEFT ARM   Final   Special Requests BOTTLES DRAWN AEROBIC AND ANAEROBIC 10CC EACH   Final   Culture  Setup Time     Final   Value: 12/20/2013 14:51     Performed at [facility]   Culture     Final   Value: GROUP B STREP(S.AGALACTIAE)ISOLATED     Note: Gram Stain Report Called to,Read Back By and Verified With: RACHEL MUTHOMI RN @1015PM  VINCJ  SUSCEPTIBILITIES PERFORMED ON PREVIOUS CULTURE WITHIN THE LAST 5 DAYS.     Performed at [facility]   Report  Status 12/23/2013 FINAL   Final  CULTURE, BLOOD (ROUTINE X 2)     Status: None   Collection Time    12/20/13  1:00 AM      Result Value Ref Range Status   Specimen Description BLOOD LEFT HAND   Final   Special Requests BOTTLES DRAWN AEROBIC ONLY 4CC   Final   Culture  Setup Time     Final   Value: 12/20/2013 14:51     Performed at [facility]   Culture     Final   Value: GROUP B STREP(S.AGALACTIAE)ISOLATED     Note: Gram Stain Report Called to,Read Back By and Verified With: [facility] 12/20/13 @ 9:30PM BY RUSCOE A.     Performed at [facility]   Report Status 12/23/2013 FINAL   Final   Organism ID, Bacteria GROUP B STREP(S.AGALACTIAE)ISOLATED   Final  MRSA PCR SCREENING     Status: Abnormal   Collection Time    12/20/13  4:36 AM      Result Value Ref Range Status   MRSA by PCR POSITIVE (*) NEGATIVE Final   Comment:            The GeneXpert MRSA Assay (FDA     approved for NASAL specimens     only), is one component of a     comprehensive MRSA colonization     surveillance program. It is not     intended to diagnose MRSA     infection nor to guide or     monitor treatment for     MRSA infections.     RESULT CALLED TO, READ BACK BY AND VERIFIED WITH:     CALLED TO RN Larene Beach LOVE [phone] @0644  THANEY     Studies: No results found.  Scheduled Meds: . amoxicillin  500 mg Oral 3 times per day  . aspirin  81 mg Oral QHS  . bicalutamide  50 mg Oral Daily  . Chlorhexidine Gluconate Cloth  6 each Topical Q0600  . diltiazem  180 mg Oral Daily  . feeding supplement (GLUCERNA SHAKE)  237 mL Oral BID BM  . fluticasone  1 spray Each Nare BID  . glipiZIDE  10 mg Oral BID AC  . heparin  5,000 Units Subcutaneous 3 times per day  . hydrocerin  1 application Topical BID  . insulin aspart  0-9 Units Subcutaneous TID WC  . levothyroxine  125 mcg Oral QAC breakfast  . metoprolol  50 mg  Oral BID  . mupirocin ointment  1 application Nasal BID  . senna-docusate  1  tablet Oral BID  . tamsulosin  0.4 mg Oral Daily   Continuous Infusions: . sodium chloride 100 mL/hr at 12/23/13 1119       Time spent: 85    [place] [email or web address], password [facility] 12/23/2013, 6:21 PM  LOS: 3 days

## 2013-12-23 NOTE — Care Management Note (Signed)
  Page 1 of 1   12/24/2013     2:25:15 PM CARE MANAGEMENT NOTE 12/24/2013  Patient:  [name], [name]   Account Number:  [id number]  Date Initiated:  12/23/2013  Documentation initiated by:  Magdalen Spatz  Subjective/Objective Assessment:     Action/Plan:   Anticipated DC Date:     Anticipated DC Plan:  [place] referral  Clinical Social Worker         Choice offered to / List presented to:          [facility] arranged  [place]   Status of service:  Completed, signed off Medicare Important Message given?  YES (If response is "NO", the following Medicare IM given date fields will be blank) Date Medicare IM given:  12/23/2013 Medicare IM given by:  Magdalen Spatz Date Additional Medicare IM given:   Additional Medicare IM given by:    Discharge Disposition:    Per UR Regulation:    If discussed at Long Length of Stay Meetings, dates discussed:    Comments:  12-24-13 Assisted Living unable to take patient back at discharge , will need SNF , SW aware and following . [facility] aware . Magdalen Spatz RN BSN [phone]   12-23-13 Prior to admission patient was active with [facility] for PT/OT , will need resumption of care orders if patient discharged back to assisted living. Magdalen Spatz RN BSN [phone]

## 2013-12-23 NOTE — Care Management Note (Signed)
Patient is active with [facility] for [place].  Resumption of care orders requested upon discharge.

## 2013-12-23 NOTE — Progress Notes (Signed)
  Echocardiogram 2D Echocardiogram has been performed.  [name] 12/23/2013, 4:53 PM

## 2013-12-23 NOTE — Progress Notes (Signed)
Physical Therapy Treatment Patient Details Name: [name] MRN: [id number] DOB: 02-Nov-1913 Today's Date: 12/23/2013    History of Present Illness [name] is a 80 y.o. male who is brought in from his ALF after they reportedly found him slumped over there and generally weak with vomit around his mouth. He was brought in, has slight confusion / AMS in the ED. He states he feels generally weak, is not short of breath, feels like he is getting enough air, is not in pain anywhere, but further details are somewhat difficult due to confusion / difficulty hearing. Work up in ED is positive for mild AKI, sepsis with likely source being the cellulitis on his RLE. He is unsure how long the RLE has been cellulitic this time, though he has an extensive history of recurrent cellulitis of the RLE. PMH includes prostate cancer with metastasis to multiple sites, DM, CKD, and HOH.    PT Comments    Pt was able to tolerate short distance gait today with RW and two person assist for safety.  He is generally weak and deconditioned and is still appropriate to d/c to SNF for rehab before returning to ALF.  He was wearing O2 prior to working with PT, but was able to maintain sats and HR off of O2 during the session.  O2 was returned to his nose at the end of the session.  Mild DOE with mobility (2/4).  PT will continue to follow acutely.    Follow Up Recommendations  SNF     Equipment Recommendations  None recommended by PT    Recommendations for Other Services OT consult     Precautions / Restrictions Precautions Precautions: Fall Precaution Comments: pt is generally weak    Mobility  Bed Mobility Overal bed mobility: Needs Assistance Bed Mobility: Supine to Sit     Supine to sit: Mod assist;HOB elevated     General bed mobility comments: Mod assist to support trunk to get to sitting from elevated HOB.  PT guided pt's hand to railing to assist with transition by having him use his upper  extremities.  Pt needed assist to support trunk to get all the way up to sitting EOB and weight shift hips out of compliant mattress.   Transfers Overall transfer level: Needs assistance Equipment used: Rolling walker (2 wheeled) Transfers: Sit to/from Stand Sit to Stand: Mod assist         General transfer comment: Mod assist to support trunk during transition to stand and sit due to pt needing help to power up over weak legs and lower slowly to chair.  Verbal cues for safe hand placement.   Ambulation/Gait Ambulation/Gait assistance: +2 safety/equipment;Min assist Ambulation Distance (Feet): 5 Feet Assistive device: Rolling walker (2 wheeled) Gait Pattern/deviations: Step-through pattern;Shuffle;Antalgic Gait velocity: decreased   General Gait Details: Pt demonstrating painful gait pattern when WB on right leg.  Shuffling steps and flexed trunk.Heavy reliance on upper extremities on RW to support trunk over weak and shaking legs.  Pt needs assistance steering RW as well.        Balance Overall balance assessment: Needs assistance Sitting-balance support: Feet supported;No upper extremity supported Sitting balance-Leahy Scale: Fair Sitting balance - Comments: mild posterior lean Postural control: Posterior lean Standing balance support: Bilateral upper extremity supported Standing balance-Leahy Scale: Poor Standing balance comment: needs support in standing to maintain balance.  Cognition Arousal/Alertness: Awake/alert Behavior During Therapy: WFL for tasks assessed/performed Overall Cognitive Status: No family/caregiver present to determine baseline cognitive functioning                 General Comments: Hearing deficits do not help his orientation/AMS.     Exercises General Exercises - Upper Extremity Shoulder Flexion: AROM;Both;10 reps;Seated Elbow Flexion: AROM;Both;10 reps;Seated General Exercises - Lower Extremity Ankle  Circles/Pumps: AROM;Both;10 reps;Seated Long Arc Quad: AROM;Both;10 reps;Seated Hip ABduction/ADduction: AROM;Both;10 reps;Seated (adduction against pillow for resistance) Hip Flexion/Marching: AROM;Both;10 reps;Seated        Pertinent Vitals/Pain HR 110s throughout session, O2 sats upper 90s both with and without O2 during session despite DOE 2/4 with mobility.  O2 returned to nose at the end of the session.             PT Goals (current goals can now be found in the care plan section) Acute Rehab PT Goals Patient Stated Goal: to walk again Progress towards PT goals: Progressing toward goals    Frequency  Min 2X/week    PT Plan Current plan remains appropriate;Frequency needs to be updated       End of Session Equipment Utilized During Treatment: Gait belt Activity Tolerance: Patient limited by fatigue;Patient limited by pain Patient left: in chair;with call bell/phone within reach;with chair alarm set     Time: 1025-8527 PT Time Calculation (min): 25 min  Charges:  $Therapeutic Exercise: 8-22 mins $Therapeutic Activity: 8-22 mins                      Eufelia Veno B. Melia Hopes, PT, DPT [phone]   12/23/2013, 12:16 PM

## 2013-12-23 NOTE — Progress Notes (Signed)
Patient ID: HENCE Lang, male   DOB: 03/01/1914, 80 y.o.   MRN: [id number]         [name]    Date of Admission:  12/20/2013           Day 5 vancomycin Principal Problem:   Recurrent cellulitis of lower leg Active Problems:   Bacteremia due to group B Streptococcus   HTN (hypertension)   DM (diabetes mellitus)   Severe sepsis with acute organ dysfunction   Lactic acidosis   CRI (chronic renal insufficiency)   H/O malignant melanoma of skin   Hearing loss   . aspirin  81 mg Oral QHS  . bicalutamide  50 mg Oral Daily  . Chlorhexidine Gluconate Cloth  6 each Topical Q0600  . feeding supplement (GLUCERNA SHAKE)  237 mL Oral BID BM  . fluticasone  1 spray Each Nare BID  . heparin  5,000 Units Subcutaneous 3 times per day  . hydrocerin  1 application Topical BID  . insulin aspart  0-9 Units Subcutaneous TID WC  . levothyroxine  125 mcg Oral QAC breakfast  . mupirocin ointment  1 application Nasal BID  . senna-docusate  1 tablet Oral BID  . tamsulosin  0.4 mg Oral Daily  . vancomycin  1,000 mg Intravenous Q48H    Subjective: He is feeling better but he is worried about what has happened to his health recently. He does not recall coming to the hospital and says that when he became aware of what was going on he "felt like he had been kidnapped". He states that he is very worried about developing pneumonia while he is in the hospital. Review of Systems: Pertinent items are noted in HPI.  Past Medical History  Diagnosis Date  . Prostate cancer   . Hypothyroidism   . Hyperglycemia   . DMII (diabetes mellitus, type 2)   . Prostate cancer, primary, with metastasis from prostate to other site 12/06/2011  . Prostate cancer metastatic to multiple sites   . A-fib   . CKD (chronic kidney disease)     History  Substance Use Topics  . Smoking status: Never Smoker   . Smokeless tobacco: Never Used  . Alcohol Use: No    Family History  Problem  Relation Age of Onset  . CAD Father 26    Allergies  Allergen Reactions  . Accupril [Quinapril Hcl] Other (See Comments)    unknown  . Naproxen Rash    Objective: Temp:  [97.7 F (36.5 C)-98.1 F (36.7 C)] 97.7 F (36.5 C) (07/29 0525) Pulse Rate:  [76-115] 76 (07/29 0525) Resp:  [20] 20 (07/29 0525) BP: (125-159)/(79-82) 159/82 mmHg (07/29 0525) SpO2:  [99 %-100 %] 100 % (07/29 0525)  General: He is alert and in no distress. He is very hard of hearing and has limited vision. He speaks in a booming voice Skin: Right leg cellulitis is improving Lungs: Clear Cor: Regular S1 and S2 with no murmurs  Lab Results Lab Results  Component Value Date   WBC 14.6* 12/23/2013   HGB 11.3* 12/23/2013   HCT 35.0* 12/23/2013   MCV 95.4 12/23/2013   PLT 154 12/23/2013    Lab Results  Component Value Date   CREATININE 1.52* 12/23/2013   BUN 22 12/23/2013   NA 140 12/23/2013   K 4.1 12/23/2013   CL 105 12/23/2013   CO2 20 12/23/2013    Lab Results  Component Value Date   ALT 13 12/20/2013  AST 17 12/20/2013   ALKPHOS 100 12/20/2013   BILITOT 0.6 12/20/2013      Microbiology: Recent Results (from the past 240 hour(s))  CULTURE, BLOOD (ROUTINE X 2)     Status: None   Collection Time    12/20/13 12:55 AM      Result Value Ref Range Status   Specimen Description BLOOD LEFT ARM   Final   Special Requests BOTTLES DRAWN AEROBIC AND ANAEROBIC 10CC EACH   Final   Culture  Setup Time     Final   Value: 12/20/2013 14:51     Performed at [facility]   Culture     Final   Value: GROUP B STREP(S.AGALACTIAE)ISOLATED     Note: Gram Stain Report Called to,Read Back By and Verified With: RACHEL MUTHOMI RN @1015PM  VINCJ      Performed at [facility]   Report Status PENDING   Incomplete  CULTURE, BLOOD (ROUTINE X 2)     Status: None   Collection Time    12/20/13  1:00 AM      Result Value Ref Range Status   Specimen Description BLOOD LEFT HAND   Final   Special Requests BOTTLES  DRAWN AEROBIC ONLY 4CC   Final   Culture  Setup Time     Final   Value: 12/20/2013 14:51     Performed at [facility]   Culture     Final   Value: GROUP B STREP(S.AGALACTIAE)ISOLATED     Note: Gram Stain Report Called to,Read Back By and Verified With: [facility] 12/20/13 @ 9:30PM BY RUSCOE A.     Performed at [facility]   Report Status PENDING   Incomplete  MRSA PCR SCREENING     Status: Abnormal   Collection Time    12/20/13  4:36 AM      Result Value Ref Range Status   MRSA by PCR POSITIVE (*) NEGATIVE Final   Comment:            The GeneXpert MRSA Assay (FDA     approved for NASAL specimens     only), is one component of a     comprehensive MRSA colonization     surveillance program. It is not     intended to diagnose MRSA     infection nor to guide or     monitor treatment for     MRSA infections.     RESULT CALLED TO, READ BACK BY AND VERIFIED WITH:     CALLED TO RN Larene Beach LOVE [phone] @0644  THANEY   Assessment: He has recurrent right lower leg cellulitis complicated by group B streptococcal bacteremia. He is improving. I will convert him to oral amoxicillin today and plan on a total of 14 days of therapy. I would suggest some physical therapy before he returns to his assisted living facility.  Plan: 1. Change IV vancomycin to oral amoxicillin  Michel Bickers, MD [facility] for [place] [phone] pager   [phone] cell 12/23/2013, 11:34 AM

## 2013-12-23 NOTE — Clinical Social Work Note (Signed)
CSW spoke with Admissions Director of [facility]. [facility] Admissions Director confirmed plan is for pt to return to ALF at time of discharge. Per Admissions Director, she will be visiting pt at bedside on 7/29 to assess and evaluate for pt's return to [facility]. CSW provided appropriate clinical documentation to [facility].  CSW updated pt's daughter, Michele Rockers, regarding information above. CSW to continue to follow and assist with discharge planning needs.  Lubertha Sayres, MSW, [place] Licensed Clinical Social Worker [phone] and [phone]

## 2013-12-23 NOTE — Progress Notes (Signed)
ANTIBIOTIC CONSULT NOTE   Pharmacy Consult for Vancomycin Indication: cellulitis, bacteremia  Allergies  Allergen Reactions  . Accupril [Quinapril Hcl] Other (See Comments)    unknown  . Naproxen Rash    Patient Measurements: Height: 6\' 2"  (188 cm) Weight: 186 lb 8.2 oz (84.6 kg) IBW/kg (Calculated) : 82.2  Vital Signs: Temp: 97.7 F (36.5 C) (07/29 0525) Temp src: Oral (07/29 0525) BP: 159/82 mmHg (07/29 0525) Pulse Rate: 76 (07/29 0525) Intake/Output from previous day: 07/28 0701 - 07/29 0700 In: 3197 [P.O.:1197; I.V.:2000] Out: 1275 [Urine:1275] Intake/Output from this shift: Total I/O In: 60 [P.O.:60] Out: -   Labs:  Recent Labs  12/21/13 0614 12/22/13 0413 12/23/13 0502  WBC 25.3* 19.7* 14.6*  HGB 12.0* 11.6* 11.3*  PLT 110* 133* 154  CREATININE 1.90* 1.79* 1.52*   Estimated Creatinine Clearance: 31.5 ml/min (by C-G formula based on Cr of 1.52). No results found for this basename: VANCOTROUGH, Corlis Leak, VANCORANDOM, GENTTROUGH, GENTPEAK, GENTRANDOM, TOBRATROUGH, TOBRAPEAK, TOBRARND, AMIKACINPEAK, AMIKACINTROU, AMIKACIN,  in the last 72 hours   Microbiology: Recent Results (from the past 720 hour(s))  CULTURE, BLOOD (ROUTINE X 2)     Status: None   Collection Time    12/20/13 12:55 AM      Result Value Ref Range Status   Specimen Description BLOOD LEFT ARM   Final   Special Requests BOTTLES DRAWN AEROBIC AND ANAEROBIC 10CC EACH   Final   Culture  Setup Time     Final   Value: 12/20/2013 14:51     Performed at [facility]   Culture     Final   Value: GROUP B STREP(S.AGALACTIAE)ISOLATED     Note: Gram Stain Report Called to,Read Back By and Verified With: RACHEL MUTHOMI RN @1015PM  VINCJ      Performed at [facility]   Report Status PENDING   Incomplete  CULTURE, BLOOD (ROUTINE X 2)     Status: None   Collection Time    12/20/13  1:00 AM      Result Value Ref Range Status   Specimen Description BLOOD LEFT HAND   Final   Special  Requests BOTTLES DRAWN AEROBIC ONLY 4CC   Final   Culture  Setup Time     Final   Value: 12/20/2013 14:51     Performed at [facility]   Culture     Final   Value: GROUP B STREP(S.AGALACTIAE)ISOLATED     Note: Gram Stain Report Called to,Read Back By and Verified With: [facility] 12/20/13 @ 9:30PM BY RUSCOE A.     Performed at [facility]   Report Status PENDING   Incomplete  MRSA PCR SCREENING     Status: Abnormal   Collection Time    12/20/13  4:36 AM      Result Value Ref Range Status   MRSA by PCR POSITIVE (*) NEGATIVE Final   Comment:            The GeneXpert MRSA Assay (FDA     approved for NASAL specimens     only), is one component of a     comprehensive MRSA colonization     surveillance program. It is not     intended to diagnose MRSA     infection nor to guide or     monitor treatment for     MRSA infections.     RESULT CALLED TO, READ BACK BY AND VERIFIED WITH:     CALLED TO RN Larene Beach  LOVE [phone] @0644  THANEY    Medical History: Past Medical History  Diagnosis Date  . Prostate cancer   . Hypothyroidism   . Hyperglycemia   . DMII (diabetes mellitus, type 2)   . Prostate cancer, primary, with metastasis from prostate to other site 12/06/2011  . Prostate cancer metastatic to multiple sites   . A-fib   . CKD (chronic kidney disease)     Medications:  APAP  ASA  Casodex  Zyrtec  Cardizem  FLonase  Lasix  Glucotrol  Lantus  Imdur  Synthroid  Ativan  Lopressor  MVI  Ntg  FLomax  Vit D  Assessment: 80 yo male with AMS, cellulitis, Group B strep bacteremia.  Renal function has improved and he is improving on current antibiotic regimen  ID notes he should be able to change to po antibiotics soon  Goal of Therapy:  Vancomycin trough 15-20  Plan:  Vancomycin 1 g IV q48h F/u need to check vanc trough.  Sylvia Helms [place] 12/23/2013,9:19 AM

## 2013-12-24 DIAGNOSIS — I4892 Unspecified atrial flutter: Secondary | ICD-10-CM | POA: Diagnosis present

## 2013-12-24 LAB — CBC
HCT: 39.4 % (ref 39.0–52.0)
Hemoglobin: 12.7 g/dL — ABNORMAL LOW (ref 13.0–17.0)
MCH: 31.1 pg (ref 26.0–34.0)
MCHC: 32.2 g/dL (ref 30.0–36.0)
MCV: 96.3 fL (ref 78.0–100.0)
PLATELETS: 129 10*3/uL — AB (ref 150–400)
RBC: 4.09 MIL/uL — ABNORMAL LOW (ref 4.22–5.81)
RDW: 13.8 % (ref 11.5–15.5)
WBC: 13.4 10*3/uL — AB (ref 4.0–10.5)

## 2013-12-24 LAB — BASIC METABOLIC PANEL
ANION GAP: 14 (ref 5–15)
BUN: 20 mg/dL (ref 6–23)
CALCIUM: 8.8 mg/dL (ref 8.4–10.5)
CHLORIDE: 105 meq/L (ref 96–112)
CO2: 20 mEq/L (ref 19–32)
Creatinine, Ser: 1.4 mg/dL — ABNORMAL HIGH (ref 0.50–1.35)
GFR calc Af Amer: 47 mL/min — ABNORMAL LOW (ref >90)
GFR calc non Af Amer: 40 mL/min — ABNORMAL LOW (ref >90)
Glucose, Bld: 193 mg/dL — ABNORMAL HIGH (ref 70–99)
Potassium: 4.5 mEq/L (ref 3.7–5.3)
Sodium: 139 mEq/L (ref 137–147)

## 2013-12-24 LAB — GLUCOSE, CAPILLARY
GLUCOSE-CAPILLARY: 187 mg/dL — AB (ref 70–99)
Glucose-Capillary: 163 mg/dL — ABNORMAL HIGH (ref 70–99)
Glucose-Capillary: 184 mg/dL — ABNORMAL HIGH (ref 70–99)
Glucose-Capillary: 279 mg/dL — ABNORMAL HIGH (ref 70–99)
Glucose-Capillary: 215 mg/dL — ABNORMAL HIGH (ref 70–99)

## 2013-12-24 MED ORDER — FUROSEMIDE 40 MG PO TABS
40.0000 mg | ORAL_TABLET | Freq: Every day | ORAL | Status: DC
  Administered 2013-12-24 – 2013-12-25 (×2): 40 mg via ORAL
  Filled 2013-12-24 (×2): qty 1

## 2013-12-24 MED ORDER — FUROSEMIDE 40 MG PO TABS: 20.0000 mg | ORAL_TABLET | ORAL | Status: DC

## 2013-12-24 MED ORDER — AMOXICILLIN 500 MG PO CAPS: 500.0000 mg | ORAL_CAPSULE | Freq: Three times a day (TID) | ORAL | Status: AC

## 2013-12-24 MED ORDER — ZIPRASIDONE MESYLATE 20 MG IM SOLR: 10.0000 mg | Freq: Once | INTRAMUSCULAR | Status: DC

## 2013-12-24 MED ORDER — HALOPERIDOL LACTATE 5 MG/ML IJ SOLN
4.0000 mg | Freq: Once | INTRAMUSCULAR | Status: AC
  Administered 2013-12-24: 4 mg via INTRAMUSCULAR
  Filled 2013-12-24: qty 1

## 2013-12-24 NOTE — Progress Notes (Signed)
Patient was scheduled to be discharge and transported to [facility] via [place]. Patient started having increased confusion, trying to get out of bed, and yelling in outbursts. MD notified. Dr. Wynelle Cleveland responded ordering Haldol 4mg  for patient. MD states that patient discharge will be on hold until patient calms down. Patient will continue to be monitored by night RN Torie.

## 2013-12-24 NOTE — Clinical Social Work Note (Signed)
CSW received notification from admissions of [facility] stating the facility would not be able to accept pt back at this time. Pt's daughter, Michele Rockers, stated she would like for pt to be discharged to [facility] at time of discharge.  Pt was accepted into [facility]. CSW faxed discharge summary to [facility]. Discharge packet complete and placed on pt's shadow chart. Pt's daughter, Michele Rockers, updated regarding new discharge disposition. Pt's daughter stated understanding and agreeable to new discharge disposition. Transportation to be arranged via EMS ([facility]).  RN to please call for report at [facility] at [place], MSW, [facility] Licensed Clinical Social Worker [phone] and [phone]

## 2013-12-24 NOTE — Discharge Summary (Addendum)
Physician Discharge Summary  [name]:[id number] DOB: [birth year] DOA: 12/20/2013  PCP: Gara Kroner, MD  Admit date: 12/20/2013 Discharge date: 12/24/2013  Time spent: >45 minutes  Recommendations for Outpatient Follow-up:  1. End date for amoxicillin is 01/03/2014 2. Being discharged to SNF- [facility]   Discharge Diagnoses:  Principal Problem:   Recurrent cellulitis of lower leg Active Problems:   Bacteremia due to group B Streptococcus   HTN (hypertension)   DM (diabetes mellitus)   Lactic acidosis   CKD (chronic kidney disease) stage 3, GFR 30-59 ml/min   H/O malignant melanoma of skin   Hearing loss   Atrial flutter   Discharge Condition: stable  Diet recommendation: carb modified, heart healthy, low sodium  Filed Weights   12/20/13 0030 12/20/13 0415 12/20/13 1304  Weight: 81.647 kg (180 lb) 83.8 kg (184 lb 11.9 oz) 84.6 kg (186 lb 8.2 oz)    History of present illness:  This is a 80 year old male who is severely hard of hearing and has a past medical history of metastatic prostate cancer, hypothyroidism, diabetes mellitus type 2, melanoma and multiple surgeries on the right leg who presented with weakness, confusion and lethargy from an assisted living facility. He was found to have right leg cellulitis which apparently he has had multiple times in the past and was admitted to the hospitalist team.  Hospital Course:  Cellulitis of right lower extremity with group B strep sepsis  - apparently has had multiple episodes- right leg has chronic changes with brawny edema and nodules - h/o melanoma and many surgeries on the leg  -Was started on empiric antibiotics with vancomycin and Zosyn pending culture ID and sensitivity -bld cultures reveal group B strep  -  2-D echo ordered given the gram-positive bacteremia - negative for vegetations -ID consulted- Dr Megan Salon has switched antibiotics to Amoxicillin and recommends 14 days of therapy  -End  date for amoxicillin is 01/03/2014  Active Problems:  A-flutter - HR increased to low 100s on 7/29 as Cardizem was being held- now back to 70s after resumption  HTN (hypertension)  -resumed Metoprolol and Cardizem on 7/29 - BP stable  DM (diabetes mellitus)  - resumed Glipizide- glucose levels now < 200 -On SSI while in hospital   Acute on CKD stage III  -mild and likely due to sepsis and dehydration and now much improved with fluids - baseline Cr about 1.4- at baseline now - resume Lasix but cut back to 20 mg every other day rather than 40 mg daily  Lactic acidosis  - Lactic acid level of 5.50 on admission -Resolved with management as above  Prostate cancer -Continue Casodex   Procedures:  none  Consultations:  ID  Discharge Exam: Filed Vitals:   12/24/13 1318  BP: 125/80  Pulse: 98  Temp: 97.5 F (36.4 C)  Resp: 18    General: alert & oriented x 3, In NAD -very hard of hearing Cardiovascular: RRR, nl S1 s2  Respiratory: Very poor air entry but CTA bilaterally Abdomen: soft +BS NT/ND, no masses palpable  Extremities: Right lower extremity with erythema, edematous areas with chronic skin changes. No longer tender    Discharge Instructions You were cared for by a hospitalist during your hospital stay. If you have any questions about your discharge medications or the care you received while you were in the hospital after you are discharged, you can call the unit and asked to speak with the hospitalist on call if the hospitalist that  took care of you is not available. Once you are discharged, your primary care physician will handle any further medical issues. Please note that NO REFILLS for any discharge medications will be authorized once you are discharged, as it is imperative that you return to your primary care physician (or establish a relationship with a primary care physician if you do not have one) for your aftercare needs so that they can reassess your need  for medications and monitor your lab values.      Discharge Instructions   Diet - low sodium heart healthy    Complete by:  As directed   And diabetic diet     Increase activity slowly    Complete by:  As directed             Medication List         acetaminophen 500 MG tablet  Commonly known as:  TYLENOL  Take 500 mg by mouth every 4 (four) hours as needed. Fever/pain     alum & mag hydroxide-simeth 200-200-20 MG/5ML suspension  Commonly known as:  MAALOX/MYLANTA  Take 30 mLs by mouth every 6 (six) hours as needed for indigestion.     amoxicillin 500 MG capsule  Commonly known as:  AMOXIL  Take 1 capsule (500 mg total) by mouth every 8 (eight) hours.     aspirin 81 MG chewable tablet  Chew 81 mg by mouth at bedtime.     bicalutamide 50 MG tablet  Commonly known as:  CASODEX  Take 50 mg by mouth daily.     cetirizine 10 MG tablet  Commonly known as:  ZYRTEC  Take 10 mg by mouth daily.     diltiazem 180 MG 24 hr capsule  Commonly known as:  CARDIZEM CD  Take 1 capsule (180 mg total) by mouth daily.     feeding supplement (GLUCERNA SHAKE) Liqd  Take 237 mLs by mouth 2 (two) times daily between meals.     fluticasone 50 MCG/ACT nasal spray  Commonly known as:  FLONASE  Place 1 spray into both nostrils 2 (two) times daily.     furosemide 40 MG tablet  Commonly known as:  LASIX  Take 0.5 tablets (20 mg total) by mouth every other day.     glipiZIDE 10 MG tablet  Commonly known as:  GLUCOTROL  Take 10 mg by mouth 2 (two) times daily before a meal.     hydrocerin Crea  Apply 1 application topically 2 (two) times daily.     HYDROcodone-acetaminophen 5-325 MG per tablet  Commonly known as:  NORCO/VICODIN  Take 1 tablet by mouth every 4 (four) hours as needed for moderate pain.     IOPHEN-NR 100 MG/5ML liquid  Generic drug:  guaiFENesin  Take 200 mg by mouth every 6 (six) hours as needed for cough. For cough     isosorbide mononitrate 30 MG 24 hr tablet   Commonly known as:  IMDUR  Take 1 tablet (30 mg total) by mouth daily.     LANTUS SOLOSTAR 100 UNIT/ML Solostar Pen  Generic drug:  Insulin Glargine  Inject 8 Units into the skin at bedtime.     levothyroxine 125 MCG tablet  Commonly known as:  SYNTHROID, LEVOTHROID  Take 125 mcg by mouth daily before breakfast.     loperamide 2 MG capsule  Commonly known as:  IMODIUM  Take 2 mg by mouth as needed for diarrhea or loose stools.     LORazepam 0.5 MG  tablet  Commonly known as:  ATIVAN  Take 0.5 mg by mouth at bedtime.     LORazepam 0.5 MG tablet  Commonly known as:  ATIVAN  Take 0.5 mg by mouth every 4 (four) hours as needed for anxiety.     magnesium hydroxide 400 MG/5ML suspension  Commonly known as:  MILK OF MAGNESIA  Take 30 mLs by mouth at bedtime as needed for constipation.     metoprolol 50 MG tablet  Commonly known as:  LOPRESSOR  Take 1 tablet (50 mg total) by mouth 2 (two) times daily.     nitroGLYCERIN 0.4 MG SL tablet  Commonly known as:  NITROSTAT  Place 1 tablet (0.4 mg total) under the tongue every 5 (five) minutes x 3 doses as needed for chest pain.     sennosides-docusate sodium 8.6-50 MG tablet  Commonly known as:  SENOKOT-S  Take 1 tablet by mouth 2 (two) times daily.     tamsulosin 0.4 MG Caps capsule  Commonly known as:  FLOMAX  Take 0.4 mg by mouth daily.     THERA/BETA-CAROTENE PO  Take 1 tablet by mouth daily.     Vitamin D (Ergocalciferol) 50000 UNITS Caps capsule  Commonly known as:  DRISDOL  Take 50,000 Units by mouth.       Allergies  Allergen Reactions  . Accupril [Quinapril Hcl] Other (See Comments)    unknown  . Naproxen Rash      The results of significant diagnostics from this hospitalization (including imaging, microbiology, ancillary and laboratory) are listed below for reference.    Significant Diagnostic Studies: Ct Head Wo Contrast  12/20/2013   CLINICAL DATA:  Altered mental status.  EXAM: CT HEAD WITHOUT CONTRAST   TECHNIQUE: Contiguous axial images were obtained from the base of the skull through the vertex without intravenous contrast.  COMPARISON:  None.  FINDINGS: There is no evidence of acute infarction, mass lesion, or intra- or extra-axial hemorrhage on CT.  Prominence of the ventricles and sulci reflects moderately severe cortical volume loss. Cerebellar atrophy is noted. Scattered periventricular and subcortical white matter likely reflects small vessel ischemic microangiopathy. A small chronic lacunar infarct is seen at the left thalamus.  The brainstem and fourth ventricle are within normal limits. The cerebral hemispheres demonstrate grossly normal gray-white differentiation. No mass effect or midline shift is seen.  There is no evidence of fracture; visualized osseous structures are unremarkable in appearance. The orbits are within normal limits. The paranasal sinuses and mastoid air cells are well-aerated. No significant soft tissue abnormalities are seen.  IMPRESSION: 1. No acute intracranial pathology seen on CT. 2. Moderately severe cortical volume loss and scattered small vessel ischemic microangiopathy. Small chronic lacunar infarct at the left thalamus.   Electronically Signed   By: Garald Balding M.D.   On: 12/20/2013 01:54   Dg Chest Port 1 View  12/20/2013   CLINICAL DATA:  Altered mental status.  EXAM: PORTABLE CHEST - 1 VIEW  COMPARISON:  12/18/2012.  FINDINGS: 0138 hrs. The cardio pericardial silhouette is enlarged. Interstitial markings are diffusely coarsened with chronic features. No edema or focal airspace consolidation. No evidence for pleural effusion. Telemetry leads overlie the chest.  IMPRESSION: Low volumes with cardiomegaly.  No acute cardiopulmonary findings.   Electronically Signed   By: Misty Stanley M.D.   On: 12/20/2013 02:05    Microbiology: Recent Results (from the past 240 hour(s))  CULTURE, BLOOD (ROUTINE X 2)     Status: None   Collection  Time    12/20/13 12:55 AM       Result Value Ref Range Status   Specimen Description BLOOD LEFT ARM   Final   Special Requests BOTTLES DRAWN AEROBIC AND ANAEROBIC 10CC EACH   Final   Culture  Setup Time     Final   Value: 12/20/2013 14:51     Performed at [facility]   Culture     Final   Value: GROUP B STREP(S.AGALACTIAE)ISOLATED     Note: Gram Stain Report Called to,Read Back By and Verified With: RACHEL MUTHOMI RN @1015PM  VINCJ  SUSCEPTIBILITIES PERFORMED ON PREVIOUS CULTURE WITHIN THE LAST 5 DAYS.     Performed at [facility]   Report Status 12/23/2013 FINAL   Final  CULTURE, BLOOD (ROUTINE X 2)     Status: None   Collection Time    12/20/13  1:00 AM      Result Value Ref Range Status   Specimen Description BLOOD LEFT HAND   Final   Special Requests BOTTLES DRAWN AEROBIC ONLY 4CC   Final   Culture  Setup Time     Final   Value: 12/20/2013 14:51     Performed at [facility]   Culture     Final   Value: GROUP B STREP(S.AGALACTIAE)ISOLATED     Note: Gram Stain Report Called to,Read Back By and Verified With: [facility] 12/20/13 @ 9:30PM BY RUSCOE A.     Performed at [facility]   Report Status 12/23/2013 FINAL   Final   Organism ID, Bacteria GROUP B STREP(S.AGALACTIAE)ISOLATED   Final  MRSA PCR SCREENING     Status: Abnormal   Collection Time    12/20/13  4:36 AM      Result Value Ref Range Status   MRSA by PCR POSITIVE (*) NEGATIVE Final   Comment:            The GeneXpert MRSA Assay (FDA     approved for NASAL specimens     only), is one component of a     comprehensive MRSA colonization     surveillance program. It is not     intended to diagnose MRSA     infection nor to guide or     monitor treatment for     MRSA infections.     RESULT CALLED TO, READ BACK BY AND VERIFIED WITH:     CALLED TO RN [facility] LOVE [phone] @[phone]  THANEY     Labs: Basic Metabolic Panel:  Recent Labs Lab 12/20/13 0042 12/20/13 0303 12/21/13 0614 12/22/13 0413 12/23/13 0502  12/24/13 0541  NA  --  139 140 139 140 139  K  --  4.2 4.0 4.2 4.1 4.5  CL  --  97 102 106 105 105  CO2  --  25 23 21 20 20   GLUCOSE  --  177* 95 153* 156* 193*  BUN  --  33* 37* 30* 22 20  CREATININE  --  1.87* 1.90* 1.79* 1.52* 1.40*  CALCIUM  --  8.7 8.5 8.8 8.6 8.8  MG 2.1  --   --   --   --   --    Liver Function Tests:  Recent Labs Lab 12/20/13 0026  AST 17  ALT 13  ALKPHOS 100  BILITOT 0.6  PROT 6.5  ALBUMIN 3.7    Recent Labs Lab 12/20/13 0026  LIPASE 13   No results found for this basename: AMMONIA,  in the last  168 hours CBC:  Recent Labs Lab 12/20/13 0026 12/20/13 0303 12/21/13 0614 12/22/13 0413 12/23/13 0502 12/24/13 0541  WBC 12.0* 20.1* 25.3* 19.7* 14.6* 13.4*  NEUTROABS 9.8*  --   --   --   --   --   HGB 14.4 12.3* 12.0* 11.6* 11.3* 12.7*  HCT 42.4 37.8* 35.5* 35.5* 35.0* 39.4  MCV 92.6 92.6 93.2 94.4 95.4 96.3  PLT 129* 122* 110* 133* 154 129*   Cardiac Enzymes: No results found for this basename: CKTOTAL, CKMB, CKMBINDEX, TROPONINI,  in the last 168 hours BNP: BNP (last 3 results) No results found for this basename: PROBNP,  in the last 8760 hours CBG:  Recent Labs Lab 12/23/13 1227 12/23/13 1658 12/24/13 0439 12/24/13 0803 12/24/13 1157  GLUCAP 206* 205* 163* 184* 187*       SignedDebbe Odea, MD [facility] 12/24/2013, 2:48 PM

## 2013-12-24 NOTE — Progress Notes (Signed)
Attempted to call report numerous of times to [facility] but no answer. Passed on to night RN Torie and she attempted also but still no answer. Just a busy dial tone.

## 2013-12-25 LAB — GLUCOSE, CAPILLARY
Glucose-Capillary: 161 mg/dL — ABNORMAL HIGH (ref 70–99)
Glucose-Capillary: 204 mg/dL — ABNORMAL HIGH (ref 70–99)

## 2013-12-25 NOTE — Progress Notes (Signed)
Patient was not discharged last night due to to agitation (sundowning) severe enough to prevent transport. He will be discharged today.   Debbe Odea, MD

## 2013-12-25 NOTE — Clinical Social Work Note (Signed)
CSW confirmed with [facility] pt's discharge for 12/25/2013. [facility] admissions liaison requesting 3pm discharge from [facility]. CSW has confirmed discharge with pt's daughter, Michele Rockers. CSW has arranged for EMS ([facility]) to transport pt at 3pm on 12/25/2013.  [facility] [address] [employer] [facility] [place] [employer] [place] [employer], MSW, [facility] Licensed Clinical Social Worker [phone] and [phone]

## 2013-12-25 NOTE — Progress Notes (Signed)
Report called and given to Sam, RN at [facility]. EMS arrived around 4:00pm to transfer patient to facility. All paperwork sent with patient. Called daughter per request to let her know that patient has been picked up.

## 2014-02-23 ENCOUNTER — Other Ambulatory Visit: Payer: Self-pay | Admitting: Geriatric Medicine

## 2014-02-23 ENCOUNTER — Ambulatory Visit
Admission: RE | Admit: 2014-02-23 | Discharge: 2014-02-23 | Disposition: A | Discharge status: Home / Self Care | Payer: Medicare Other | Source: Ambulatory Visit | Attending: Geriatric Medicine | Admitting: Geriatric Medicine

## 2014-02-23 DIAGNOSIS — J189 Pneumonia, unspecified organism: Secondary | ICD-10-CM

## 2014-03-25 ENCOUNTER — Emergency Department (HOSPITAL_COMMUNITY): Payer: Medicare Other

## 2014-03-25 ENCOUNTER — Encounter (HOSPITAL_COMMUNITY): Payer: Self-pay | Admitting: Emergency Medicine

## 2014-03-25 ENCOUNTER — Inpatient Hospital Stay (HOSPITAL_COMMUNITY)
Admission: EM | Admit: 2014-03-25 | Discharge: 2014-03-27 | DRG: 638 | Disposition: A | Discharge status: Skilled Nursing Facility | Payer: Medicare Other | Attending: Internal Medicine | Admitting: Internal Medicine

## 2014-03-25 DIAGNOSIS — I4891 Unspecified atrial fibrillation: Secondary | ICD-10-CM | POA: Diagnosis present

## 2014-03-25 DIAGNOSIS — Z9889 Other specified postprocedural states: Secondary | ICD-10-CM | POA: Diagnosis not present

## 2014-03-25 DIAGNOSIS — I10 Essential (primary) hypertension: Secondary | ICD-10-CM | POA: Diagnosis present

## 2014-03-25 DIAGNOSIS — W19XXXA Unspecified fall, initial encounter: Secondary | ICD-10-CM | POA: Diagnosis present

## 2014-03-25 DIAGNOSIS — N400 Enlarged prostate without lower urinary tract symptoms: Secondary | ICD-10-CM

## 2014-03-25 DIAGNOSIS — Z792 Long term (current) use of antibiotics: Secondary | ICD-10-CM

## 2014-03-25 DIAGNOSIS — N179 Acute kidney failure, unspecified: Secondary | ICD-10-CM | POA: Diagnosis present

## 2014-03-25 DIAGNOSIS — C799 Secondary malignant neoplasm of unspecified site: Secondary | ICD-10-CM | POA: Diagnosis present

## 2014-03-25 DIAGNOSIS — S0083XA Contusion of other part of head, initial encounter: Secondary | ICD-10-CM | POA: Diagnosis present

## 2014-03-25 DIAGNOSIS — E11649 Type 2 diabetes mellitus with hypoglycemia without coma: Secondary | ICD-10-CM | POA: Diagnosis present

## 2014-03-25 DIAGNOSIS — Z79899 Other long term (current) drug therapy: Secondary | ICD-10-CM | POA: Diagnosis not present

## 2014-03-25 DIAGNOSIS — E860 Dehydration: Secondary | ICD-10-CM | POA: Diagnosis present

## 2014-03-25 DIAGNOSIS — F0391 Unspecified dementia with behavioral disturbance: Secondary | ICD-10-CM | POA: Diagnosis present

## 2014-03-25 DIAGNOSIS — I5022 Chronic systolic (congestive) heart failure: Secondary | ICD-10-CM | POA: Diagnosis present

## 2014-03-25 DIAGNOSIS — Z66 Do not resuscitate: Secondary | ICD-10-CM | POA: Diagnosis present

## 2014-03-25 DIAGNOSIS — J90 Pleural effusion, not elsewhere classified: Secondary | ICD-10-CM | POA: Diagnosis present

## 2014-03-25 DIAGNOSIS — T383X5A Adverse effect of insulin and oral hypoglycemic [antidiabetic] drugs, initial encounter: Secondary | ICD-10-CM | POA: Diagnosis present

## 2014-03-25 DIAGNOSIS — Z9181 History of falling: Secondary | ICD-10-CM

## 2014-03-25 DIAGNOSIS — Z22322 Carrier or suspected carrier of Methicillin resistant Staphylococcus aureus: Secondary | ICD-10-CM | POA: Diagnosis not present

## 2014-03-25 DIAGNOSIS — Z888 Allergy status to other drugs, medicaments and biological substances status: Secondary | ICD-10-CM | POA: Diagnosis not present

## 2014-03-25 DIAGNOSIS — D72829 Elevated white blood cell count, unspecified: Secondary | ICD-10-CM

## 2014-03-25 DIAGNOSIS — E039 Hypothyroidism, unspecified: Secondary | ICD-10-CM | POA: Diagnosis present

## 2014-03-25 DIAGNOSIS — N183 Chronic kidney disease, stage 3 unspecified: Secondary | ICD-10-CM | POA: Diagnosis present

## 2014-03-25 DIAGNOSIS — Y92122 Bedroom in nursing home as the place of occurrence of the external cause: Secondary | ICD-10-CM

## 2014-03-25 DIAGNOSIS — R001 Bradycardia, unspecified: Secondary | ICD-10-CM | POA: Diagnosis present

## 2014-03-25 DIAGNOSIS — Z7982 Long term (current) use of aspirin: Secondary | ICD-10-CM

## 2014-03-25 DIAGNOSIS — F0390 Unspecified dementia without behavioral disturbance: Secondary | ICD-10-CM | POA: Diagnosis present

## 2014-03-25 DIAGNOSIS — I129 Hypertensive chronic kidney disease with stage 1 through stage 4 chronic kidney disease, or unspecified chronic kidney disease: Secondary | ICD-10-CM | POA: Diagnosis present

## 2014-03-25 DIAGNOSIS — T148XXA Other injury of unspecified body region, initial encounter: Secondary | ICD-10-CM

## 2014-03-25 DIAGNOSIS — Z85820 Personal history of malignant melanoma of skin: Secondary | ICD-10-CM | POA: Diagnosis not present

## 2014-03-25 DIAGNOSIS — I890 Lymphedema, not elsewhere classified: Secondary | ICD-10-CM | POA: Diagnosis present

## 2014-03-25 DIAGNOSIS — R9389 Abnormal findings on diagnostic imaging of other specified body structures: Secondary | ICD-10-CM | POA: Diagnosis present

## 2014-03-25 DIAGNOSIS — Z794 Long term (current) use of insulin: Secondary | ICD-10-CM | POA: Diagnosis not present

## 2014-03-25 DIAGNOSIS — E119 Type 2 diabetes mellitus without complications: Secondary | ICD-10-CM

## 2014-03-25 DIAGNOSIS — L03119 Cellulitis of unspecified part of limb: Secondary | ICD-10-CM

## 2014-03-25 DIAGNOSIS — J189 Pneumonia, unspecified organism: Secondary | ICD-10-CM

## 2014-03-25 DIAGNOSIS — C61 Malignant neoplasm of prostate: Secondary | ICD-10-CM

## 2014-03-25 DIAGNOSIS — E162 Hypoglycemia, unspecified: Secondary | ICD-10-CM | POA: Diagnosis present

## 2014-03-25 DIAGNOSIS — I214 Non-ST elevation (NSTEMI) myocardial infarction: Secondary | ICD-10-CM

## 2014-03-25 DIAGNOSIS — E160 Drug-induced hypoglycemia without coma: Secondary | ICD-10-CM | POA: Diagnosis present

## 2014-03-25 HISTORY — DX: Unspecified dementia, unspecified severity, without behavioral disturbance, psychotic disturbance, mood disturbance, and anxiety: F03.90

## 2014-03-25 LAB — GLUCOSE, CAPILLARY
Glucose-Capillary: 135 mg/dL — ABNORMAL HIGH (ref 70–99)
Glucose-Capillary: 171 mg/dL — ABNORMAL HIGH (ref 70–99)
Glucose-Capillary: 229 mg/dL — ABNORMAL HIGH (ref 70–99)
Glucose-Capillary: 117 mg/dL — ABNORMAL HIGH (ref 70–99)
Glucose-Capillary: 108 mg/dL — ABNORMAL HIGH (ref 70–99)

## 2014-03-25 LAB — URINE MICROSCOPIC-ADD ON

## 2014-03-25 LAB — CBC WITH DIFFERENTIAL/PLATELET
Basophils Absolute: 0 10*3/uL (ref 0.0–0.1)
Basophils Relative: 0 % (ref 0–1)
Eosinophils Absolute: 0.3 10*3/uL (ref 0.0–0.7)
Eosinophils Relative: 2 % (ref 0–5)
HCT: 32 % — ABNORMAL LOW (ref 39.0–52.0)
Hemoglobin: 10.3 g/dL — ABNORMAL LOW (ref 13.0–17.0)
LYMPHS ABS: 2.9 10*3/uL (ref 0.7–4.0)
Lymphocytes Relative: 20 % (ref 12–46)
MCH: 29.6 pg (ref 26.0–34.0)
MCHC: 32.2 g/dL (ref 30.0–36.0)
MCV: 92 fL (ref 78.0–100.0)
MONO ABS: 1.3 10*3/uL — AB (ref 0.1–1.0)
Monocytes Relative: 9 % (ref 3–12)
NEUTROS PCT: 69 % (ref 43–77)
Neutro Abs: 9.9 10*3/uL — ABNORMAL HIGH (ref 1.7–7.7)
Platelets: 158 10*3/uL (ref 150–400)
RBC: 3.48 MIL/uL — ABNORMAL LOW (ref 4.22–5.81)
RDW: 15.3 % (ref 11.5–15.5)
WBC: 14.4 10*3/uL — ABNORMAL HIGH (ref 4.0–10.5)

## 2014-03-25 LAB — COMPREHENSIVE METABOLIC PANEL
ALBUMIN: 2.6 g/dL — AB (ref 3.5–5.2)
ALK PHOS: 103 U/L (ref 39–117)
ALT: 21 U/L (ref 0–53)
AST: 27 U/L (ref 0–37)
Anion gap: 12 (ref 5–15)
BILIRUBIN TOTAL: 0.5 mg/dL (ref 0.3–1.2)
BUN: 37 mg/dL — ABNORMAL HIGH (ref 6–23)
CHLORIDE: 100 meq/L (ref 96–112)
CO2: 26 meq/L (ref 19–32)
CREATININE: 1.69 mg/dL — AB (ref 0.50–1.35)
Calcium: 9.1 mg/dL (ref 8.4–10.5)
GFR calc Af Amer: 37 mL/min — ABNORMAL LOW (ref >90)
GFR, EST NON AFRICAN AMERICAN: 32 mL/min — AB (ref >90)
Glucose, Bld: 68 mg/dL — ABNORMAL LOW (ref 70–99)
POTASSIUM: 4.7 meq/L (ref 3.7–5.3)
Sodium: 138 mEq/L (ref 137–147)
Total Protein: 5.7 g/dL — ABNORMAL LOW (ref 6.0–8.3)

## 2014-03-25 LAB — URINALYSIS, ROUTINE W REFLEX MICROSCOPIC
Bilirubin Urine: NEGATIVE
GLUCOSE, UA: NEGATIVE mg/dL
HGB URINE DIPSTICK: NEGATIVE
Ketones, ur: NEGATIVE mg/dL
Leukocytes, UA: NEGATIVE
Nitrite: NEGATIVE
PH: 6.5 (ref 5.0–8.0)
Protein, ur: 30 mg/dL — AB
SPECIFIC GRAVITY, URINE: 1.019 (ref 1.005–1.030)
Urobilinogen, UA: 0.2 mg/dL (ref 0.0–1.0)

## 2014-03-25 LAB — MRSA PCR SCREENING: MRSA by PCR: POSITIVE — AB

## 2014-03-25 LAB — PROTIME-INR
INR: 1.19 (ref 0.00–1.49)
Prothrombin Time: 15.2 seconds (ref 11.6–15.2)

## 2014-03-25 LAB — CBG MONITORING, ED
Glucose-Capillary: 65 mg/dL — ABNORMAL LOW (ref 70–99)
Glucose-Capillary: 66 mg/dL — ABNORMAL LOW (ref 70–99)
Glucose-Capillary: 98 mg/dL (ref 70–99)

## 2014-03-25 LAB — I-STAT TROPONIN, ED: TROPONIN I, POC: 0.02 ng/mL (ref 0.00–0.08)

## 2014-03-25 LAB — LIPASE, BLOOD: Lipase: 7 U/L — ABNORMAL LOW (ref 11–59)

## 2014-03-25 LAB — CK: Total CK: 8 U/L (ref 7–232)

## 2014-03-25 LAB — I-STAT CG4 LACTIC ACID, ED: Lactic Acid, Venous: 1.18 mmol/L (ref 0.5–2.2)

## 2014-03-25 MED ORDER — FLUTICASONE PROPIONATE 50 MCG/ACT NA SUSP
1.0000 | Freq: Two times a day (BID) | NASAL | Status: DC
  Administered 2014-03-25 – 2014-03-27 (×4): 1 via NASAL
  Filled 2014-03-25: qty 16

## 2014-03-25 MED ORDER — DEXTROSE 5 % IV SOLN
2.0000 g | Freq: Once | INTRAVENOUS | Status: DC
  Administered 2014-03-25: 2 g via INTRAVENOUS
  Filled 2014-03-25: qty 2

## 2014-03-25 MED ORDER — ACETAMINOPHEN 650 MG RE SUPP: 650.0000 mg | Freq: Four times a day (QID) | RECTAL | Status: DC | PRN

## 2014-03-25 MED ORDER — ALUM & MAG HYDROXIDE-SIMETH 200-200-20 MG/5ML PO SUSP: 30.0000 mL | Freq: Four times a day (QID) | ORAL | Status: DC | PRN

## 2014-03-25 MED ORDER — OXYCODONE HCL 5 MG PO TABS: 5.0000 mg | ORAL_TABLET | ORAL | Status: DC | PRN

## 2014-03-25 MED ORDER — DILTIAZEM HCL ER COATED BEADS 180 MG PO CP24
180.0000 mg | ORAL_CAPSULE | Freq: Every day | ORAL | Status: DC
  Administered 2014-03-25 – 2014-03-26 (×2): 180 mg via ORAL
  Filled 2014-03-25 (×2): qty 1

## 2014-03-25 MED ORDER — ONDANSETRON HCL 4 MG PO TABS: 4.0000 mg | ORAL_TABLET | Freq: Four times a day (QID) | ORAL | Status: DC | PRN

## 2014-03-25 MED ORDER — DEXTROSE 10 % IV SOLN
INTRAVENOUS | Status: DC
  Administered 2014-03-25: 06:00:00 via INTRAVENOUS

## 2014-03-25 MED ORDER — ACETAMINOPHEN 325 MG PO TABS: 650.0000 mg | ORAL_TABLET | Freq: Four times a day (QID) | ORAL | Status: DC | PRN

## 2014-03-25 MED ORDER — LORATADINE 10 MG PO TABS
10.0000 mg | ORAL_TABLET | Freq: Every day | ORAL | Status: DC
  Administered 2014-03-25 – 2014-03-27 (×3): 10 mg via ORAL
  Filled 2014-03-25 (×3): qty 1

## 2014-03-25 MED ORDER — GUAIFENESIN 100 MG/5ML PO SOLN
200.0000 mg | Freq: Four times a day (QID) | ORAL | Status: DC | PRN
  Filled 2014-03-25: qty 10

## 2014-03-25 MED ORDER — OCTREOTIDE ACETATE 50 MCG/ML IJ SOLN
50.0000 ug | Freq: Once | INTRAMUSCULAR | Status: AC
  Administered 2014-03-25: 50 ug via SUBCUTANEOUS
  Filled 2014-03-25: qty 1

## 2014-03-25 MED ORDER — ADULT MULTIVITAMIN W/MINERALS CH
1.0000 | ORAL_TABLET | Freq: Every day | ORAL | Status: DC
  Administered 2014-03-25 – 2014-03-27 (×3): 1 via ORAL
  Filled 2014-03-25 (×3): qty 1

## 2014-03-25 MED ORDER — DEXTROSE 10 % IV SOLN
INTRAVENOUS | Status: DC
  Administered 2014-03-25: 02:00:00 via INTRAVENOUS

## 2014-03-25 MED ORDER — HYDROMORPHONE HCL 1 MG/ML IJ SOLN
0.5000 mg | INTRAMUSCULAR | Status: DC | PRN
  Administered 2014-03-25: 0.5 mg via INTRAVENOUS
  Filled 2014-03-25: qty 1

## 2014-03-25 MED ORDER — PIPERACILLIN-TAZOBACTAM 3.375 G IVPB
3.3750 g | Freq: Three times a day (TID) | INTRAVENOUS | Status: DC
  Administered 2014-03-25 – 2014-03-26 (×4): 3.375 g via INTRAVENOUS
  Filled 2014-03-25 (×5): qty 50

## 2014-03-25 MED ORDER — PIPERACILLIN-TAZOBACTAM 3.375 G IVPB 30 MIN
3.3750 g | Freq: Once | INTRAVENOUS | Status: AC
  Administered 2014-03-25: 3.375 g via INTRAVENOUS
  Filled 2014-03-25: qty 50

## 2014-03-25 MED ORDER — DIVALPROEX SODIUM ER 250 MG PO TB24
250.0000 mg | ORAL_TABLET | Freq: Every day | ORAL | Status: DC
  Administered 2014-03-25 – 2014-03-27 (×3): 250 mg via ORAL
  Filled 2014-03-25 (×3): qty 1

## 2014-03-25 MED ORDER — METOPROLOL TARTRATE 50 MG PO TABS
50.0000 mg | ORAL_TABLET | Freq: Two times a day (BID) | ORAL | Status: DC
  Administered 2014-03-25 – 2014-03-26 (×3): 50 mg via ORAL
  Filled 2014-03-25 (×4): qty 1

## 2014-03-25 MED ORDER — TAMSULOSIN HCL 0.4 MG PO CAPS
0.4000 mg | ORAL_CAPSULE | Freq: Every day | ORAL | Status: DC
  Administered 2014-03-25 – 2014-03-27 (×3): 0.4 mg via ORAL
  Filled 2014-03-25 (×4): qty 1

## 2014-03-25 MED ORDER — MUPIROCIN 2 % EX OINT
1.0000 | TOPICAL_OINTMENT | Freq: Two times a day (BID) | CUTANEOUS | Status: DC
Start: 2014-03-25 — End: 2014-03-27
  Administered 2014-03-25 – 2014-03-27 (×5): 1 via NASAL
  Filled 2014-03-25: qty 22

## 2014-03-25 MED ORDER — LEVOTHYROXINE SODIUM 125 MCG PO TABS
125.0000 ug | ORAL_TABLET | Freq: Every day | ORAL | Status: DC
  Administered 2014-03-25 – 2014-03-27 (×3): 125 ug via ORAL
  Filled 2014-03-25 (×4): qty 1

## 2014-03-25 MED ORDER — DEXTROSE 5 % IV SOLN
500.0000 mg | Freq: Once | INTRAVENOUS | Status: AC
  Administered 2014-03-25: 500 mg via INTRAVENOUS
  Filled 2014-03-25: qty 500

## 2014-03-25 MED ORDER — VANCOMYCIN HCL IN DEXTROSE 1-5 GM/200ML-% IV SOLN
1000.0000 mg | Freq: Once | INTRAVENOUS | Status: AC
  Administered 2014-03-25: 1000 mg via INTRAVENOUS
  Filled 2014-03-25: qty 200

## 2014-03-25 MED ORDER — DEXTROSE 10 % IV SOLN
INTRAVENOUS | Status: DC
  Administered 2014-03-25: 06:00:00 via INTRAVENOUS
  Filled 2014-03-25 (×2): qty 1000

## 2014-03-25 MED ORDER — NITROGLYCERIN 0.4 MG SL SUBL: 0.4000 mg | SUBLINGUAL_TABLET | SUBLINGUAL | Status: DC | PRN

## 2014-03-25 MED ORDER — ISOSORBIDE MONONITRATE ER 30 MG PO TB24
30.0000 mg | ORAL_TABLET | Freq: Every day | ORAL | Status: DC
  Administered 2014-03-25 – 2014-03-27 (×3): 30 mg via ORAL
  Filled 2014-03-25 (×4): qty 1

## 2014-03-25 MED ORDER — CHLORHEXIDINE GLUCONATE CLOTH 2 % EX PADS
6.0000 | MEDICATED_PAD | Freq: Every day | CUTANEOUS | Status: DC
Start: 2014-03-25 — End: 2014-03-27
  Administered 2014-03-25 – 2014-03-26 (×2): 6 via TOPICAL

## 2014-03-25 MED ORDER — DEXTROSE 5 % IV SOLN
INTRAVENOUS | Status: AC
  Administered 2014-03-25: 13:00:00 via INTRAVENOUS

## 2014-03-25 MED ORDER — SENNOSIDES-DOCUSATE SODIUM 8.6-50 MG PO TABS
1.0000 | ORAL_TABLET | Freq: Two times a day (BID) | ORAL | Status: DC
  Administered 2014-03-25 – 2014-03-27 (×5): 1 via ORAL
  Filled 2014-03-25 (×6): qty 1

## 2014-03-25 MED ORDER — ONDANSETRON HCL 4 MG/2ML IJ SOLN: 4.0000 mg | Freq: Four times a day (QID) | INTRAMUSCULAR | Status: DC | PRN

## 2014-03-25 MED ORDER — INSULIN ASPART 100 UNIT/ML ~~LOC~~ SOLN
0.0000 [IU] | SUBCUTANEOUS | Status: DC
  Administered 2014-03-25: 2 [IU] via SUBCUTANEOUS
  Administered 2014-03-25: 3 [IU] via SUBCUTANEOUS
  Administered 2014-03-25 – 2014-03-26 (×3): 1 [IU] via SUBCUTANEOUS

## 2014-03-25 MED ORDER — VANCOMYCIN HCL IN DEXTROSE 1-5 GM/200ML-% IV SOLN
1000.0000 mg | INTRAVENOUS | Status: DC
  Administered 2014-03-26: 1000 mg via INTRAVENOUS
  Filled 2014-03-25: qty 200

## 2014-03-25 MED ORDER — ASPIRIN 81 MG PO CHEW
81.0000 mg | CHEWABLE_TABLET | Freq: Every day | ORAL | Status: DC
  Administered 2014-03-25 – 2014-03-26 (×2): 81 mg via ORAL
  Filled 2014-03-25 (×2): qty 1

## 2014-03-25 NOTE — H&P (Signed)
[facility] Admission History and Physical       [name] OIZ:[id number] DOB: [birth year] DOA: 03/25/2014  Referring physician:  PCP: Gara Kroner, MD  Specialists:   Chief Complaint:  Fall  HPI: [name] is a 80 y.o. male with a history of DM2, HTN, Atrial Fibrillation, Dementia, and CKD who was found on the floor in his room at his SNF. It is not known how long he had been on the floor, and he had bruising and swelling of the right side of his forehead and skin tears to his right arm.   EMS was called and he was found to have hypoglycemia at a level of 55, and was given IV dextrose.   In the ED he had further decrease in his glucose levels and was placed on IV D10 and referred for medical admission.     Review of Systems:  Constitutional: No Weight Loss, No Weight Gain, Night Sweats, Fevers, Chills, Dizziness, Fatigue, or Generalized Weakness HEENT: No Headaches, Difficulty Swallowing,Tooth/Dental Problems,Sore Throat,  No Sneezing, Rhinitis, Ear Ache, Nasal Congestion, or Post Nasal Drip,  Cardio-vascular:  No Chest pain, Orthopnea, PND, Edema in Lower Extremities, Anasarca, Dizziness, Palpitations  Resp: No Dyspnea, No DOE, No Productive Cough, No Non-Productive Cough, No Hemoptysis, No Wheezing.    GI: No Heartburn, Indigestion, Abdominal Pain, Nausea, Vomiting, Diarrhea, Hematemesis, Hematochezia, Melena, Change in Bowel Habits,  Loss of Appetite  GU: No Dysuria, Change in Color of Urine, No Urgency or Frequency, No Flank pain.  Musculoskeletal: No Joint Pain or Swelling, No Decreased Range of Motion, No Back Pain.  Neurologic: No Syncope, No Seizures, Muscle Weakness, Paresthesia, Vision Disturbance or Loss, No Diplopia, No Vertigo, No Difficulty Walking,  Skin: No Rash or Lesions. Psych: No Change in Mood or Affect, No Depression or Anxiety, No Memory loss, No Confusion, or Hallucinations   Past Medical History  Diagnosis Date  . Prostate cancer   .  Hypothyroidism   . Hyperglycemia   . DMII (diabetes mellitus, type 2)   . Prostate cancer, primary, with metastasis from prostate to other site 12/06/2011  . Prostate cancer metastatic to multiple sites   . A-fib   . CKD (chronic kidney disease)   . Dementia       Past Surgical History  Procedure Laterality Date  . Melanoma removed from left leg  1971    x 14  . Left cataract extraction    . Prostate surgery    . Sinus exploration         Prior to Admission medications   Medication Sig Start Date End Date Taking? Authorizing Provider  acetaminophen (TYLENOL) 500 MG tablet Take 500 mg by mouth every 4 (four) hours as needed. Fever/pain   Yes Historical Provider, MD  amoxicillin (AMOXIL) 500 MG capsule Take 500 mg by mouth 3 (three) times daily.   Yes Historical Provider, MD  aspirin 81 MG chewable tablet Chew 81 mg by mouth at bedtime.   Yes Historical Provider, MD  bicalutamide (CASODEX) 50 MG tablet Take 50 mg by mouth daily.   Yes Historical Provider, MD  cetirizine (ZYRTEC) 10 MG tablet Take 10 mg by mouth daily.   Yes Historical Provider, MD  diltiazem (CARDIZEM CD) 180 MG 24 hr capsule Take 1 capsule (180 mg total) by mouth daily. 10/12/11 03/17/22 Yes Lezlie Octave Black, NP  divalproex (DEPAKOTE ER) 250 MG 24 hr tablet Take 250 mg by mouth daily.   Yes Historical Provider, MD  fluticasone (  FLONASE) 50 MCG/ACT nasal spray Place 1 spray into both nostrils 2 (two) times daily.   Yes Historical Provider, MD  glipiZIDE (GLUCOTROL) 10 MG tablet Take 10 mg by mouth daily before breakfast.    Yes Historical Provider, MD  glipiZIDE (GLUCOTROL) 5 MG tablet Take 7.5 mg by mouth at bedtime.   Yes Historical Provider, MD  guaiFENesin (IOPHEN-NR) 100 MG/5ML liquid Take 200 mg by mouth every 6 (six) hours as needed for cough. For cough   Yes Historical Provider, MD  HYDROcodone-acetaminophen (NORCO/VICODIN) 5-325 MG per tablet Take 1 tablet by mouth every 4 (four) hours as needed for moderate  pain.   Yes Historical Provider, MD  insulin glargine (LANTUS) 100 UNIT/ML injection Inject 4 Units into the skin at bedtime.   Yes Historical Provider, MD  isosorbide mononitrate (IMDUR) 30 MG 24 hr tablet Take 1 tablet (30 mg total) by mouth daily. 03/17/12  Yes Jettie Booze, MD  levothyroxine (SYNTHROID, LEVOTHROID) 125 MCG tablet Take 125 mcg by mouth daily before breakfast.   Yes Historical Provider, MD  loperamide (IMODIUM) 2 MG capsule Take 2 mg by mouth as needed for diarrhea or loose stools.   Yes Historical Provider, MD  Melatonin 5 MG TABS Take 1 tablet by mouth at bedtime.   Yes Historical Provider, MD  metoprolol (LOPRESSOR) 50 MG tablet Take 1 tablet (50 mg total) by mouth 2 (two) times daily. 10/12/11 03/17/22 Yes Radene Gunning, NP  Multiple Vitamin (MULTIVITAMIN WITH MINERALS) TABS tablet Take 1 tablet by mouth daily.   Yes Historical Provider, MD  nitroGLYCERIN (NITROSTAT) 0.4 MG SL tablet Place 1 tablet (0.4 mg total) under the tongue every 5 (five) minutes x 3 doses as needed for chest pain. 03/17/12  Yes Jettie Booze, MD  sennosides-docusate sodium (SENOKOT-S) 8.6-50 MG tablet Take 1 tablet by mouth 2 (two) times daily.   Yes Historical Provider, MD  Tamsulosin HCl (FLOMAX) 0.4 MG CAPS Take 0.4 mg by mouth daily.   Yes Historical Provider, MD      Allergies  Allergen Reactions  . Accupril [Quinapril Hcl] Other (See Comments)    unknown  . Naproxen Rash     Social History:  reports that he has never smoked. He has never used smokeless tobacco. He reports that he does not drink alcohol or use illicit drugs.     Family History  Problem Relation Age of Onset  . CAD Father 41       Physical Exam:  GEN:  Confused and Agitated Elderly Thin  80 y.o.  Caucasian male examined  and in no acute distress; cooperative with exam Filed Vitals:   03/25/14 0045 03/25/14 0145 03/25/14 0245 03/25/14 0443  BP: 103/49 129/69 121/55 112/70  Pulse: 83 89 79 75  Temp:     98 F (36.7 C)  TempSrc:    Oral  Resp: 21 16 14 20   Height:      Weight:    80.4 kg (177 lb 4 oz)  SpO2: 95% 85% 100% 100%   Blood pressure 112/70, pulse 75, temperature 98 F (36.7 C), temperature source Oral, resp. rate 20, height 5\' 10"  (1.778 m), weight 80.4 kg (177 lb 4 oz), SpO2 100.00%. PSYCH: He is alert and oriented x 2; does not appear anxious does not appear depressed; affect is normal HEENT: Normocephalic and Atraumatic, Mucous membranes pink; PERRLA; EOM intact; Fundi:  Benign;  No scleral icterus, Nares: Patent, Oropharynx: Clear, Fair Dentition,    Neck:  FROM,  No Cervical Lymphadenopathy nor Thyromegaly or Carotid Bruit; No JVD; Breasts:: Not examined CHEST WALL: No tenderness CHEST: Normal respiration, clear to auscultation bilaterally HEART: Regular rate and rhythm; no murmurs rubs or gallops BACK: No kyphosis or scoliosis; No CVA tenderness ABDOMEN: Positive Bowel Sounds, Soft Non-Tender; No Masses, No Organomegaly. Rectal Exam: Not done EXTREMITIES: Deformity of Skin of the RLE;     LLE:   No Cyanosis, Clubbing, or Edema; No Ulcerations. Genitalia: not examined PULSES: 2+ and symmetric SKIN: Normal hydration no rash or ulceration CNS:  Alert and Oriented x 2,  Able to move all 4 Extremities,  Extremely HOH Vascular: pulses palpable throughout    Labs on Admission:  Basic Metabolic Panel:  Recent Labs Lab 03/25/14 0040  NA 138  K 4.7  CL 100  CO2 26  GLUCOSE 68*  BUN 37*  CREATININE 1.69*  CALCIUM 9.1   Liver Function Tests:  Recent Labs Lab 03/25/14 0040  AST 27  ALT 21  ALKPHOS 103  BILITOT 0.5  PROT 5.7*  ALBUMIN 2.6*    Recent Labs Lab 03/25/14 0040  LIPASE 7*   No results found for this basename: AMMONIA,  in the last 168 hours CBC: No results found for this basename: WBC, NEUTROABS, HGB, HCT, MCV, PLT,  in the last 168 hours Cardiac Enzymes:  Recent Labs Lab 03/25/14 0040  CKTOTAL 8    BNP (last 3 results) No results  found for this basename: PROBNP,  in the last 8760 hours CBG:  Recent Labs Lab 03/25/14 0028 03/25/14 0222 03/25/14 0331 03/25/14 0439  GLUCAP 65* 66* 98 135*    Radiological Exams on Admission: Dg Chest 2 View  03/25/2014   CLINICAL DATA:  Fall  EXAM: CHEST  2 VIEW  COMPARISON:  02/23/2014  FINDINGS: Prominent cardiomediastinal contours, similar to prior. Atherosclerotic vascular calcifications. Layering pleural effusions, right greater than left. Interstitial prominence. No pneumothorax. No acute osseous finding.  IMPRESSION: Prominent cardiomediastinal contours, similar to prior.  Right greater than left pleural effusions and associated airspace opacities; atelectasis versus infiltrate.  Interstitial prominence may reflect interstitial edema.   Electronically Signed   By: Carlos Levering M.D.   On: 03/25/2014 02:13   Dg Forearm Right  03/25/2014   CLINICAL DATA:  Fall with humerus pain.  EXAM: RIGHT FOREARM - 2 VIEW  COMPARISON:  None.  FINDINGS: The distal forearm is oblique on frontal imaging, decreasing diagnostic sensitivity. There is no evidence of acute fracture or dislocation. Osteopenia.  IMPRESSION: No acute osseous findings.   Electronically Signed   By: Jorje Guild M.D.   On: 03/25/2014 02:14   Ct Head Wo Contrast  03/25/2014   CLINICAL DATA:  Fall, right upper forehead hematoma.  EXAM: CT HEAD WITHOUT CONTRAST  CT CERVICAL SPINE WITHOUT CONTRAST  TECHNIQUE: Multidetector CT imaging of the head and cervical spine was performed following the standard protocol without intravenous contrast. Multiplanar CT image reconstructions of the cervical spine were also generated.  COMPARISON:  12/20/2013 head CT  FINDINGS: CT HEAD FINDINGS  Prominence of the sulci, cisterns, and ventricles, in keeping with cerebral and cerebellar volume loss. No definite CT evidence of an acute infarction. Subcortical and periventricular white matter hypodensities, a nonspecific finding often seen in the  setting of chronic microangiopathic change. No intraparenchymal hemorrhage, mass, mass effect, or abnormal extra-axial fluid collection. No overt hydrocephalus. Right frontal scalp hematoma. No underlying calvarial fracture.  CT CERVICAL SPINE FINDINGS  Mildly prominent lymph nodes along the cervical chains.  Partially imaged bilateral pleural effusions measure water attenuation. Atherosclerotic vascular calcifications.  Maintained craniocervical relationship. No dens fracture. Grade 1 anterolisthesis of C4 on C5. Severe C5-6 and C6-7 degenerative disc disease. Multilevel facet arthropathy. No displaced acute fracture identified. Paravertebral soft tissues within normal limits.  IMPRESSION: Volume loss and white matter changes as above. No CT evidence of an acute intracranial abnormality.  Right frontal scalp hematoma.  No underlying calvarial fracture.  Multilevel degenerative changes of the cervical spine. No acute osseus finding.   Electronically Signed   By: Carlos Levering M.D.   On: 03/25/2014 02:44   Ct Cervical Spine Wo Contrast  03/25/2014   CLINICAL DATA:  Fall, right upper forehead hematoma.  EXAM: CT HEAD WITHOUT CONTRAST  CT CERVICAL SPINE WITHOUT CONTRAST  TECHNIQUE: Multidetector CT imaging of the head and cervical spine was performed following the standard protocol without intravenous contrast. Multiplanar CT image reconstructions of the cervical spine were also generated.  COMPARISON:  12/20/2013 head CT  FINDINGS: CT HEAD FINDINGS  Prominence of the sulci, cisterns, and ventricles, in keeping with cerebral and cerebellar volume loss. No definite CT evidence of an acute infarction. Subcortical and periventricular white matter hypodensities, a nonspecific finding often seen in the setting of chronic microangiopathic change. No intraparenchymal hemorrhage, mass, mass effect, or abnormal extra-axial fluid collection. No overt hydrocephalus. Right frontal scalp hematoma. No underlying calvarial  fracture.  CT CERVICAL SPINE FINDINGS  Mildly prominent lymph nodes along the cervical chains. Partially imaged bilateral pleural effusions measure water attenuation. Atherosclerotic vascular calcifications.  Maintained craniocervical relationship. No dens fracture. Grade 1 anterolisthesis of C4 on C5. Severe C5-6 and C6-7 degenerative disc disease. Multilevel facet arthropathy. No displaced acute fracture identified. Paravertebral soft tissues within normal limits.  IMPRESSION: Volume loss and white matter changes as above. No CT evidence of an acute intracranial abnormality.  Right frontal scalp hematoma.  No underlying calvarial fracture.  Multilevel degenerative changes of the cervical spine. No acute osseus finding.   Electronically Signed   By: Carlos Levering M.D.   On: 03/25/2014 02:44   Dg Humerus Right  03/25/2014   CLINICAL DATA:  Fall.  Posterior humerus wound.  Initial encounter  EXAM: RIGHT HUMERUS - 2+ VIEW  COMPARISON:  None.  FINDINGS: There is no evidence of fracture or other focal bone lesions. Osteopenia.  IMPRESSION: No acute osseous findings.   Electronically Signed   By: Jorje Guild M.D.   On: 03/25/2014 02:13     EKG: Independently reviewed. Normal sinus Rhythm rate 84 without Acute Changes   Assessment/Plan:   80 y.o. male with   Principal Problem:   1.   Hypoglycemia due to insulin   Placed on IVF with D10 to maintain Glucose levels   Monitor CBGs every hr x 6, then every 4 hrs   Hold Lantus Insulin and Glipizide RX   SSI coverage PRN   Active Problems:   2.   HCAP (healthcare-associated pneumonia)   IV Vancomycin and Zosyn   Albuterol Nebs    O2 PRN     3.   Fall- Possibly due to Hypoglycemia   Fall Precautions     4.   Contusion- due to Fall   Ice TID x 2 days     5.   Hypothyroidism   Continue Leovthyroxine   Check TSH Level     6.   HTN (hypertension)   Continue Diltiazem and Metoprolol Rx     7.   DM (diabetes mellitus)  Holding  Lantus and Glipzide until Glucose Satablilizes     8.   CKD (chronic kidney disease) stage 3, GFR 30-59 ml/min     9.   Dementia   Chronic    10.  Atrial fibrillation- Paroxysmal, Not on Anti-Coagulants due to Fall Risks   Continue Diltiazem, and Metoprolol Rx    11.  DVT Prophylaxis   Lovenox       Code Status:     FULL   Family Communication:    No Family present Disposition Plan:       Inpatient  Time spent:  [place] [facility] Pager [phone]   If [place] Please Contact the Day Rounding Team MD for [facility]  If 7PM-7AM, Please Contact Night-Floor Coverage  [email or web address] Password [email or web address] 03/25/2014, 5:46 AM

## 2014-03-25 NOTE — ED Notes (Signed)
CBG 66 

## 2014-03-25 NOTE — Evaluation (Signed)
Occupational Therapy Evaluation Patient Details Name: CHRISTOHPER DUBE MRN: [id number] DOB: 1913/08/10 Today's Date: 03/25/2014    History of Present Illness Pt admitted from [facility] after falling out of bed resulting in bruising and swelling above R eye and R UE skin tears. Pt also with hypoglycemia and PNA.  Pt with hx of melanoma in R LE, DM, Afib, dementia, CKD. Pt with low vision and is very HOH.   Clinical Impression   Pt is dependent in ADL at baseline and resides in a SNF upon which he intends to return.  No further acute OT needs.  Will defer further OT to the discretion of SNF.    Follow Up Recommendations  SNF;Supervision/Assistance - 24 hour    Equipment Recommendations       Recommendations for Other Services       Precautions / Restrictions Precautions Precautions: Fall Restrictions Weight Bearing Restrictions: No      Mobility Bed Mobility Overal bed mobility: Needs Assistance Bed Mobility: Supine to Sit;Sit to Supine     Supine to sit: Min assist Sit to supine: Min assist      Transfers                      Balance                                            ADL Overall ADL's : At baseline                                             Vision                 Additional Comments: low vision, sees gross forms, no detail   Perception     Praxis      Pertinent Vitals/Pain Pain Assessment: No/denies pain     Hand Dominance Right   Extremity/Trunk Assessment Upper Extremity Assessment Upper Extremity Assessment: Generalized weakness (at least 3/5)   Lower Extremity Assessment Lower Extremity Assessment: Defer to PT evaluation       Communication Communication Communication: HOH   Cognition Arousal/Alertness: Awake/alert Behavior During Therapy: WFL for tasks assessed/performed Overall Cognitive Status: History of cognitive impairments - at baseline                     General Comments       Exercises       Shoulder Instructions      Home Living Family/patient expects to be discharged to:: Skilled nursing facility                                        Prior Functioning/Environment Level of Independence: Needs assistance  Gait / Transfers Assistance Needed: Pt was a one person transfer to his w/c.  He used a w/c as his primary means of mobility. ADL's / Homemaking Assistance Needed: Pt is assisted for eating, bathing, dressing and toileting. Communication / Swallowing Assistance Needed: HOH Comments: PLOF from staff at [facility]    OT Diagnosis: Generalized weakness;Cognitive deficits   OT Problem List: Decreased strength;Decreased activity tolerance;Impaired balance (sitting and/or standing);Decreased cognition;Decreased safety awareness;Decreased knowledge of use  of DME or AE   OT Treatment/Interventions:      OT Goals(Current goals can be found in the care plan section) Acute Rehab OT Goals Patient Stated Goal: Pt wants to be more mobile.  OT Frequency:     Barriers to D/C:            Co-evaluation              End of Session Nurse Communication:  (pt may have orange juice)  Activity Tolerance: Patient tolerated treatment well Patient left: in bed;with call bell/phone within reach;with bed alarm set   Time: 1017-5102 OT Time Calculation (min): 37 min Charges:  OT General Charges $OT Visit: 1 Procedure OT Evaluation $Initial OT Evaluation Tier I: 1 Procedure OT Treatments $Self Care/Home Management : 8-22 mins G-Codes:    Malka So 03/25/2014, 3:55 PM [phone]

## 2014-03-25 NOTE — ED Notes (Signed)
CBG 98  

## 2014-03-25 NOTE — Progress Notes (Signed)
Pt admitted to [facility] room 11 from the ED. Pt has a hematoma on R forehead, lacs on right arm from fall. Pts BS on admission was stable. D10 going at 150. Pt is confused but alert to self. Nursing home called 2C  questioning admission to the hospital, nursing home staff stated that family will be very upset that pt was admitted. Nursing home staff said they would call and update family.  Rylee Huestis M. Dalbert Batman, RN, BSN 03/25/2014 5:05 AM

## 2014-03-25 NOTE — ED Notes (Signed)
Per EMS: [facility] was called to [facility] for unwittnessed fall. EMS reports skin tear right arm. Cellulitis right lower extremity. Hematoma right upper forehead.   History of MRSA

## 2014-03-25 NOTE — Progress Notes (Addendum)
Rn paged this PA regarding pt's status.  Pt has DNR Armandina Gemma Rod form at bedside with no expiration, changed status to DNR.  Lacy Duverney [facility] [phone]

## 2014-03-25 NOTE — Progress Notes (Signed)
ANTIBIOTIC CONSULT NOTE - INITIAL  Pharmacy Consult for Vancocin and Zosyn Indication: rule out pneumonia  Allergies  Allergen Reactions  . Accupril [Quinapril Hcl] Other (See Comments)    unknown  . Naproxen Rash    Patient Measurements: Height: 5\' 10"  (177.8 cm) Weight: 177 lb 4 oz (80.4 kg) IBW/kg (Calculated) : 73  Vital Signs: Temp: 98 F (36.7 C) (10/29 0443) Temp Source: Oral (10/29 0443) BP: 112/70 mmHg (10/29 0443) Pulse Rate: 75 (10/29 0443)  Labs:  Recent Labs  03/25/14 0040  CREATININE 1.69*   Estimated Creatinine Clearance: 25.2 ml/min (by C-G formula based on Cr of 1.69).   Microbiology: No results found for this or any previous visit (from the past 720 hour(s)).  Medical History: Past Medical History  Diagnosis Date  . Prostate cancer   . Hypothyroidism   . Hyperglycemia   . DMII (diabetes mellitus, type 2)   . Prostate cancer, primary, with metastasis from prostate to other site 12/06/2011  . Prostate cancer metastatic to multiple sites   . A-fib   . CKD (chronic kidney disease)   . Dementia     Medications:  Prescriptions prior to admission  Medication Sig Dispense Refill  . acetaminophen (TYLENOL) 500 MG tablet Take 500 mg by mouth every 4 (four) hours as needed. Fever/pain      . amoxicillin (AMOXIL) 500 MG capsule Take 500 mg by mouth 3 (three) times daily.      Marland Kitchen aspirin 81 MG chewable tablet Chew 81 mg by mouth at bedtime.      . bicalutamide (CASODEX) 50 MG tablet Take 50 mg by mouth daily.      . cetirizine (ZYRTEC) 10 MG tablet Take 10 mg by mouth daily.      Marland Kitchen diltiazem (CARDIZEM CD) 180 MG 24 hr capsule Take 1 capsule (180 mg total) by mouth daily.      . divalproex (DEPAKOTE ER) 250 MG 24 hr tablet Take 250 mg by mouth daily.      . fluticasone (FLONASE) 50 MCG/ACT nasal spray Place 1 spray into both nostrils 2 (two) times daily.      Marland Kitchen glipiZIDE (GLUCOTROL) 10 MG tablet Take 10 mg by mouth daily before breakfast.       .  glipiZIDE (GLUCOTROL) 5 MG tablet Take 7.5 mg by mouth at bedtime.      Marland Kitchen guaiFENesin (IOPHEN-NR) 100 MG/5ML liquid Take 200 mg by mouth every 6 (six) hours as needed for cough. For cough      . HYDROcodone-acetaminophen (NORCO/VICODIN) 5-325 MG per tablet Take 1 tablet by mouth every 4 (four) hours as needed for moderate pain.      Marland Kitchen insulin glargine (LANTUS) 100 UNIT/ML injection Inject 4 Units into the skin at bedtime.      . isosorbide mononitrate (IMDUR) 30 MG 24 hr tablet Take 1 tablet (30 mg total) by mouth daily.  30 tablet  11  . levothyroxine (SYNTHROID, LEVOTHROID) 125 MCG tablet Take 125 mcg by mouth daily before breakfast.      . loperamide (IMODIUM) 2 MG capsule Take 2 mg by mouth as needed for diarrhea or loose stools.      . Melatonin 5 MG TABS Take 1 tablet by mouth at bedtime.      . metoprolol (LOPRESSOR) 50 MG tablet Take 1 tablet (50 mg total) by mouth 2 (two) times daily.      . Multiple Vitamin (MULTIVITAMIN WITH MINERALS) TABS tablet Take 1 tablet by mouth daily.      Marland Kitchen  nitroGLYCERIN (NITROSTAT) 0.4 MG SL tablet Place 1 tablet (0.4 mg total) under the tongue every 5 (five) minutes x 3 doses as needed for chest pain.  25 tablet  6  . sennosides-docusate sodium (SENOKOT-S) 8.6-50 MG tablet Take 1 tablet by mouth 2 (two) times daily.      . Tamsulosin HCl (FLOMAX) 0.4 MG CAPS Take 0.4 mg by mouth daily.       Scheduled:  . aspirin  81 mg Oral QHS  . diltiazem  180 mg Oral Daily  . divalproex  250 mg Oral Daily  . fluticasone  1 spray Each Nare BID  . insulin aspart  0-9 Units Subcutaneous 6 times per day  . isosorbide mononitrate  30 mg Oral Daily  . levothyroxine  125 mcg Oral QAC breakfast  . loratadine  10 mg Oral Daily  . metoprolol  50 mg Oral BID  . multivitamin with minerals  1 tablet Oral Daily  . piperacillin-tazobactam  3.375 g Intravenous Once  . senna-docusate  1 tablet Oral BID  . tamsulosin  0.4 mg Oral Daily  . vancomycin  1,000 mg Intravenous Once    Infusions:  . dextrose 10 % 1,000 mL infusion    . dextrose      Assessment: 80yo male sustained unwitnessed fall at NH, cellulitis is noted on RLE (taking amoxicillin PTA) and new hematoma on forehead, found to be hypoglycemic, CXR concerning for atelectasis vs infiltrate, to begin IV ABX.  Goal of Therapy:  Vancomycin trough level 15-20 mcg/ml  Plan:  Vanc 1g and Zosyn 3.375g IV ordered, not given in ED but to be started on SDU; will continue with vancomycin 1000mg  IV Q24H and Zosyn 3.375g IV Q8H and monitor CBC, Cx, CrCl, levels prn.  Wynona Neat, PharmD, BCPS  03/25/2014,5:46 AM

## 2014-03-25 NOTE — ED Provider Notes (Signed)
CSN: [id number]     Arrival date & time 03/25/14  0019 History   First MD Initiated Contact with Patient 03/25/14 0030     Chief Complaint  Patient presents with  . Fall  . Hypoglycemia     (Consider location/radiation/quality/duration/timing/severity/associated sxs/prior Treatment) Patient is a 80 y.o. male presenting with fall and hypoglycemia.  Fall  Hypoglycemia  [name] is a 80 y.o. male with past medical history of diabetes, atrial fibrillation, chronic kidney disease presenting after a fall. Patient was transferred from nursing facility found down for unknown period of time. Per EMS patient was found on his right side with no deficits. Fingerstick at that time was 55 and he received an amp of D50. Repeat check was 130. Is unable to find his own history due to history of dementia.  Repeat fingerstick in the emergency department is 65. Patient is unable to tell me if he is in pain anywhere. He has obvious bruising and hematoma formation to his right forehead, along with skin tears of his right upper extremity.  Patient is currently being treated for cellulitis with amoxicillin, and he has been taking Lantus and glipizide for his diabetes.    Past Medical History  Diagnosis Date  . Prostate cancer   . Hypothyroidism   . Hyperglycemia   . DMII (diabetes mellitus, type 2)   . Prostate cancer, primary, with metastasis from prostate to other site 12/06/2011  . Prostate cancer metastatic to multiple sites   . A-fib   . CKD (chronic kidney disease)    Past Surgical History  Procedure Laterality Date  . Melanoma removed from left leg  1971    x 14  . Left cataract extraction    . Prostate surgery    . Sinus exploration     Family History  Problem Relation Age of Onset  . CAD Father 39   History  Substance Use Topics  . Smoking status: Never Smoker   . Smokeless tobacco: Never Used  . Alcohol Use: No    Review of Systems  Unable to perform ROS: Dementia       Allergies  Accupril and Naproxen  Home Medications   Prior to Admission medications   Medication Sig Start Date End Date Taking? Authorizing Provider  acetaminophen (TYLENOL) 500 MG tablet Take 500 mg by mouth every 4 (four) hours as needed. Fever/pain    Historical Provider, MD  alum & mag hydroxide-simeth (MAALOX/MYLANTA) 200-200-20 MG/5ML suspension Take 30 mLs by mouth every 6 (six) hours as needed for indigestion.    Historical Provider, MD  aspirin 81 MG chewable tablet Chew 81 mg by mouth at bedtime.    Historical Provider, MD  bicalutamide (CASODEX) 50 MG tablet Take 50 mg by mouth daily.    Historical Provider, MD  cetirizine (ZYRTEC) 10 MG tablet Take 10 mg by mouth daily.    Historical Provider, MD  diltiazem (CARDIZEM CD) 180 MG 24 hr capsule Take 1 capsule (180 mg total) by mouth daily. 10/12/11 03/17/22  Radene Gunning, NP  feeding supplement (GLUCERNA SHAKE) LIQD Take 237 mLs by mouth 2 (two) times daily between meals. 12/19/12   Belkys A Regalado, MD  fluticasone (FLONASE) 50 MCG/ACT nasal spray Place 1 spray into both nostrils 2 (two) times daily.    Historical Provider, MD  furosemide (LASIX) 40 MG tablet Take 0.5 tablets (20 mg total) by mouth every other day. 12/24/13   Debbe Odea, MD  glipiZIDE (GLUCOTROL) 10 MG tablet Take  10 mg by mouth 2 (two) times daily before a meal.    Historical Provider, MD  guaiFENesin (IOPHEN-NR) 100 MG/5ML liquid Take 200 mg by mouth every 6 (six) hours as needed for cough. For cough    Historical Provider, MD  hydrocerin (EUCERIN) CREA Apply 1 application topically 2 (two) times daily.    Historical Provider, MD  HYDROcodone-acetaminophen (NORCO/VICODIN) 5-325 MG per tablet Take 1 tablet by mouth every 4 (four) hours as needed for moderate pain.    Historical Provider, MD  Insulin Glargine (LANTUS SOLOSTAR) 100 UNIT/ML Solostar Pen Inject 8 Units into the skin at bedtime.    Historical Provider, MD  isosorbide mononitrate (IMDUR) 30 MG  24 hr tablet Take 1 tablet (30 mg total) by mouth daily. 03/17/12   Jettie Booze, MD  levothyroxine (SYNTHROID, LEVOTHROID) 125 MCG tablet Take 125 mcg by mouth daily before breakfast.    Historical Provider, MD  loperamide (IMODIUM) 2 MG capsule Take 2 mg by mouth as needed for diarrhea or loose stools.    Historical Provider, MD  LORazepam (ATIVAN) 0.5 MG tablet Take 0.5 mg by mouth at bedtime.    Historical Provider, MD  LORazepam (ATIVAN) 0.5 MG tablet Take 0.5 mg by mouth every 4 (four) hours as needed for anxiety.    Historical Provider, MD  magnesium hydroxide (MILK OF MAGNESIA) 400 MG/5ML suspension Take 30 mLs by mouth at bedtime as needed for constipation.    Historical Provider, MD  metoprolol (LOPRESSOR) 50 MG tablet Take 1 tablet (50 mg total) by mouth 2 (two) times daily. 10/12/11 03/17/22  Radene Gunning, NP  Multiple Vitamin (THERA/BETA-CAROTENE PO) Take 1 tablet by mouth daily.    Historical Provider, MD  nitroGLYCERIN (NITROSTAT) 0.4 MG SL tablet Place 1 tablet (0.4 mg total) under the tongue every 5 (five) minutes x 3 doses as needed for chest pain. 03/17/12   Jettie Booze, MD  sennosides-docusate sodium (SENOKOT-S) 8.6-50 MG tablet Take 1 tablet by mouth 2 (two) times daily.    Historical Provider, MD  Tamsulosin HCl (FLOMAX) 0.4 MG CAPS Take 0.4 mg by mouth daily.    Historical Provider, MD  Vitamin D, Ergocalciferol, (DRISDOL) 50000 UNITS CAPS Take 50,000 Units by mouth.    Historical Provider, MD   SpO2 96% Physical Exam  Nursing note and vitals reviewed. Constitutional: Vital signs are normal. He appears well-developed and well-nourished.  Non-toxic appearance. He does not appear ill. No distress.  HENT:  Nose: Nose normal.  Mouth/Throat: Oropharynx is clear and moist. No oropharyngeal exudate.  Soft tissue hematoma to the right super orbital area. No active hemorrhage.  Eyes: Conjunctivae and EOM are normal. Pupils are equal, round, and reactive to light. No  scleral icterus.  Neck: Normal range of motion. Neck supple. No tracheal deviation, no edema, no erythema and normal range of motion present. No mass and no thyromegaly present.  No midline spinal tenderness. C-collar in place.  Cardiovascular: Normal rate, S1 normal, S2 normal, normal heart sounds, intact distal pulses and normal pulses.  Exam reveals no gallop and no friction rub.   No murmur heard. Pulses:      Radial pulses are 2+ on the right side, and 2+ on the left side.       Dorsalis pedis pulses are 2+ on the right side, and 2+ on the left side.  Irregularly irregular rhythm.  Pulmonary/Chest: Effort normal and breath sounds normal. No respiratory distress. He has no wheezes. He has no rhonchi.  He has no rales.  Abdominal: Soft. Normal appearance and bowel sounds are normal. He exhibits no distension, no ascites and no mass. There is no hepatosplenomegaly. There is no tenderness. There is no rebound, no guarding and no CVA tenderness.  Musculoskeletal: Normal range of motion. He exhibits no edema and no tenderness.  Right upper extremity skin tears.  Mid humerus is 4 cm in length, elbow skin tear is 3 cm in length.  No deep laceration seen  Lymphadenopathy:    He has no cervical adenopathy.  Neurological: He is alert. He has normal strength. No cranial nerve deficit or sensory deficit. He exhibits normal muscle tone. GCS eye subscore is 4. GCS verbal subscore is 5. GCS motor subscore is 6.  Patient moves all 4 extremities but does not follow commands due to dementia. Reacts to pain in all 4 extremities.    Skin: Skin is warm, dry and intact. No petechiae and no rash noted. He is not diaphoretic. No erythema. No pallor.  Psychiatric: He has a normal mood and affect. His behavior is normal. Judgment normal.    ED Course  Procedures (including critical care time) Labs Review Labs Reviewed  COMPREHENSIVE METABOLIC PANEL - Abnormal; Notable for the following:    Glucose, Bld 68 (*)     BUN 37 (*)    Creatinine, Ser 1.69 (*)    Total Protein 5.7 (*)    Albumin 2.6 (*)    GFR calc non Af Amer 32 (*)    GFR calc Af Amer 37 (*)    All other components within normal limits  LIPASE, BLOOD - Abnormal; Notable for the following:    Lipase 7 (*)    All other components within normal limits  URINALYSIS, ROUTINE W REFLEX MICROSCOPIC - Abnormal; Notable for the following:    Protein, ur 30 (*)    All other components within normal limits  CBG MONITORING, ED - Abnormal; Notable for the following:    Glucose-Capillary 65 (*)    All other components within normal limits  CBG MONITORING, ED - Abnormal; Notable for the following:    Glucose-Capillary 66 (*)    All other components within normal limits  CULTURE, BLOOD (ROUTINE X 2)  CULTURE, BLOOD (ROUTINE X 2)  URINE CULTURE  PROTIME-INR  CK  URINE MICROSCOPIC-ADD ON  I-STAT CG4 LACTIC ACID, ED  Randolm Idol, ED    Imaging Review Dg Chest 2 View  03/25/2014   CLINICAL DATA:  Fall  EXAM: CHEST  2 VIEW  COMPARISON:  02/23/2014  FINDINGS: Prominent cardiomediastinal contours, similar to prior. Atherosclerotic vascular calcifications. Layering pleural effusions, right greater than left. Interstitial prominence. No pneumothorax. No acute osseous finding.  IMPRESSION: Prominent cardiomediastinal contours, similar to prior.  Right greater than left pleural effusions and associated airspace opacities; atelectasis versus infiltrate.  Interstitial prominence may reflect interstitial edema.   Electronically Signed   By: Carlos Levering M.D.   On: 03/25/2014 02:13   Dg Forearm Right  03/25/2014   CLINICAL DATA:  Fall with humerus pain.  EXAM: RIGHT FOREARM - 2 VIEW  COMPARISON:  None.  FINDINGS: The distal forearm is oblique on frontal imaging, decreasing diagnostic sensitivity. There is no evidence of acute fracture or dislocation. Osteopenia.  IMPRESSION: No acute osseous findings.   Electronically Signed   By: Jorje Guild M.D.    On: 03/25/2014 02:14   Ct Head Wo Contrast  03/25/2014   CLINICAL DATA:  Fall, right upper forehead hematoma.  EXAM:  CT HEAD WITHOUT CONTRAST  CT CERVICAL SPINE WITHOUT CONTRAST  TECHNIQUE: Multidetector CT imaging of the head and cervical spine was performed following the standard protocol without intravenous contrast. Multiplanar CT image reconstructions of the cervical spine were also generated.  COMPARISON:  12/20/2013 head CT  FINDINGS: CT HEAD FINDINGS  Prominence of the sulci, cisterns, and ventricles, in keeping with cerebral and cerebellar volume loss. No definite CT evidence of an acute infarction. Subcortical and periventricular white matter hypodensities, a nonspecific finding often seen in the setting of chronic microangiopathic change. No intraparenchymal hemorrhage, mass, mass effect, or abnormal extra-axial fluid collection. No overt hydrocephalus. Right frontal scalp hematoma. No underlying calvarial fracture.  CT CERVICAL SPINE FINDINGS  Mildly prominent lymph nodes along the cervical chains. Partially imaged bilateral pleural effusions measure water attenuation. Atherosclerotic vascular calcifications.  Maintained craniocervical relationship. No dens fracture. Grade 1 anterolisthesis of C4 on C5. Severe C5-6 and C6-7 degenerative disc disease. Multilevel facet arthropathy. No displaced acute fracture identified. Paravertebral soft tissues within normal limits.  IMPRESSION: Volume loss and white matter changes as above. No CT evidence of an acute intracranial abnormality.  Right frontal scalp hematoma.  No underlying calvarial fracture.  Multilevel degenerative changes of the cervical spine. No acute osseus finding.   Electronically Signed   By: Carlos Levering M.D.   On: 03/25/2014 02:44   Ct Cervical Spine Wo Contrast  03/25/2014   CLINICAL DATA:  Fall, right upper forehead hematoma.  EXAM: CT HEAD WITHOUT CONTRAST  CT CERVICAL SPINE WITHOUT CONTRAST  TECHNIQUE: Multidetector CT imaging  of the head and cervical spine was performed following the standard protocol without intravenous contrast. Multiplanar CT image reconstructions of the cervical spine were also generated.  COMPARISON:  12/20/2013 head CT  FINDINGS: CT HEAD FINDINGS  Prominence of the sulci, cisterns, and ventricles, in keeping with cerebral and cerebellar volume loss. No definite CT evidence of an acute infarction. Subcortical and periventricular white matter hypodensities, a nonspecific finding often seen in the setting of chronic microangiopathic change. No intraparenchymal hemorrhage, mass, mass effect, or abnormal extra-axial fluid collection. No overt hydrocephalus. Right frontal scalp hematoma. No underlying calvarial fracture.  CT CERVICAL SPINE FINDINGS  Mildly prominent lymph nodes along the cervical chains. Partially imaged bilateral pleural effusions measure water attenuation. Atherosclerotic vascular calcifications.  Maintained craniocervical relationship. No dens fracture. Grade 1 anterolisthesis of C4 on C5. Severe C5-6 and C6-7 degenerative disc disease. Multilevel facet arthropathy. No displaced acute fracture identified. Paravertebral soft tissues within normal limits.  IMPRESSION: Volume loss and white matter changes as above. No CT evidence of an acute intracranial abnormality.  Right frontal scalp hematoma.  No underlying calvarial fracture.  Multilevel degenerative changes of the cervical spine. No acute osseus finding.   Electronically Signed   By: Carlos Levering M.D.   On: 03/25/2014 02:44   Dg Humerus Right  03/25/2014   CLINICAL DATA:  Fall.  Posterior humerus wound.  Initial encounter  EXAM: RIGHT HUMERUS - 2+ VIEW  COMPARISON:  None.  FINDINGS: There is no evidence of fracture or other focal bone lesions. Osteopenia.  IMPRESSION: No acute osseous findings.   Electronically Signed   By: Jorje Guild M.D.   On: 03/25/2014 02:13     EKG Interpretation   Date/Time:  Thursday March 25 2014  00:25:59 EDT Ventricular Rate:  84 PR Interval:    QRS Duration: 112 QT Interval:  387 QTC Calculation: 457 R Axis:   98 Text Interpretation:  Normal sinus rhythm  Ventricular premature complex  Low voltage, extremity leads Artifact No significant change since last  tracing Confirmed by Glynn Octave [phone]) on 03/25/2014 12:41:01  AM      MDM   Final diagnoses:  Fall    Patient presented to the emergency department from nursing home after being found down. Will evaluate with CT scan and x-rays for injury, infectious workup, tetanus shot was updated.  Glipizide was likely the cause of the patient's hypoglycemia. Was given octreotide only placed on a D10 drip.  Chest x-ray reveals possible pneumonia, due to nursing home he was covered with vancomycin, Zosyn, and azithromycin.  Patient placed on D10 drip with every hour fingersticks. He will be admitted to the stepdown unit with [facility].  CRITICAL CARE Performed by: Everlene Balls  hypoglycemia  Total critical care time: 35 min.  Critical care time was exclusive of separately billable procedures and treating other patients.  Critical care was necessary to treat or prevent imminent or life-threatening deterioration.  Critical care was time spent personally by me on the following activities: development of treatment plan with patient and/or surrogate as well as nursing, discussions with consultants, evaluation of patient's response to treatment, examination of patient, obtaining history from patient or surrogate, ordering and performing treatments and interventions, ordering and review of laboratory studies, ordering and review of radiographic studies, pulse oximetry and re-evaluation of patient's condition.    Everlene Balls, MD 03/25/14 [phone]

## 2014-03-25 NOTE — Progress Notes (Signed)
[facility]  [name] EHU:[id number] DOB: 1914/02/02 DOA: 03/25/2014 PCP: Gara Kroner, MD   Brief narrative: 80 year old male patient with history of diabetes, hypertension, chronic atrial fibrillation, dementia as well as chronic kidney disease. He was found on the floor in his room at his skilled nursing facility. Unknown how long he had been down. He was found to have bruising and swelling of the right side of his forehead and skin tears to his right arm. EMS was called and upon their evaluation the patient was found to have moderate hypoglycemia with a reading of 55. He was given IV dextrose bolus.  After arrival to the ER and initial repeat CBG was 130 but later the CBG went back down to 65 so D10 infusion was initiated. In review of patient's preadmission medications he is on twice a day Glucotrol as well as Lantus. CT of the head and cervical spine were without any acute injuries. X-rays of the right arm were also negative for acute fracture. Chest x-ray was read as right greater than left pleural effusions and associated airspace opacities atelectasis versus infiltrate. Patient has not been hypoxic. Patient appeared to be volume depleted with a BUN of 37 and a creatinine of 1.69. Baseline renal function appears to be BUN 20 and creatinine 1.4.  HPI/Subjective: Patient alert and mildly confused. Having difficulty hearing but this appears to be primarily influenced by hard cervical collar obscuring ears. Denies pain or shortness of breath. Occasionally becomes confused and kneels out "help" if nursing staff are not immediately in the room.  Assessment/Plan: Active Problems:   DM/ Hypoglycemia Patient on Lantus 4 units at hour sleep as well as Glucotrol 10 mg in the a.m. and 7.5 mg in the p.m. prior to admission-given patient's underlying CKD 3 will take greater than 48 hours to wash out the effects of the longer acting Glucotrol therefore  we'll continue D10 infusion until clear patient tolerating oral intake well then we'll transition to D5W based formula-CBGs have improved with dextrose IV-continue to hold all insulins for now as well as Glucotrol-check hemoglobin A1c  1220 pm: CBGs now > 200 so will dc D10 in favor of D5W- pt still with poor PO intake.    Abnormal chest x-ray Possible effusions and pneumonia-I have reviewed chest x-ray and am not convinced patient has a pneumonia process but initial chest x-ray could evolve infiltrates after patient better hydrated-continue empiric antibiotics for now-patient is not hypoxic which points to him not having an acute pulmonary process-check CBC to determine if has leukocytosis      Fall Unwitnessed so unclear if mechanical or true syncope-most likely etiology related to dehydration as well as hypoglycemia-patient has underlying atrial fibrillation so could have been an arrhythmia based-continue telemetry-PT/OT evaluations-CT cervical spine without acute injury so discontinue hard cervical collar    HTN  Current blood pressures are well controlled on Cardizem, Imdur and metoprolol-given recent fall we'll check orthostatic vital signs and if indicated adjust medications    Acute renal failure on CKD stage 3 Continue IV fluids-follow labs    Atrial fibrillation Currently rate controlled on CCB and BB-given advanced age and apparent recurrent falls and poor renal function is not an appropriate candidate for anticoagulation    Hypothyroidism Continue Synthroid    Contusion CT head reveals only soft tissue injury and no acute abnormalities-try to utilize nonnarcotic medications for pain    Dementia    BPH/known history of prostate  cancer Continue Flomax   DVT prophylaxis: SCDs Code Status: Full Family Communication: No family at bedside Disposition Plan/Expected LOS: Remain in stepdown until CBGs more stable and able to transition off D10  infusion   Consultants: None  Procedures: None  Cultures: Blood cultures 2 pending Urine culture pending  Antibiotics: Zithromax 10/28 for 1 dose Rocephin 10/28 for 1 dose Zosyn 10/28 >> Vancomycin 10/28 >>  Objective: Blood pressure 127/60, pulse 68, temperature 97.8 F (36.6 C), temperature source Axillary, resp. rate 14, height 5\' 10"  (1.778 m), weight 177 lb 4 oz (80.4 kg), SpO2 96.00%.  Intake/Output Summary (Last 24 hours) at 03/25/14 1057 Last data filed at 03/25/14 0900  Gross per 24 hour  Intake    290 ml  Output    425 ml  Net   -135 ml     Exam: Follow-up exam completed. Patient admitted at 5:46 AM today  Scheduled Meds:  Scheduled Meds: . aspirin  81 mg Oral QHS  . Chlorhexidine Gluconate Cloth  6 each Topical Q0600  . diltiazem  180 mg Oral Daily  . divalproex  250 mg Oral Daily  . fluticasone  1 spray Each Nare BID  . insulin aspart  0-9 Units Subcutaneous 6 times per day  . isosorbide mononitrate  30 mg Oral Daily  . levothyroxine  125 mcg Oral QAC breakfast  . loratadine  10 mg Oral Daily  . metoprolol  50 mg Oral BID  . multivitamin with minerals  1 tablet Oral Daily  . mupirocin ointment  1 application Nasal BID  . piperacillin-tazobactam (ZOSYN)  IV  3.375 g Intravenous Q8H  . senna-docusate  1 tablet Oral BID  . tamsulosin  0.4 mg Oral Daily  . [START ON 03/26/2014] vancomycin  1,000 mg Intravenous Q24H   Continuous Infusions: . dextrose 10 % 1,000 mL infusion 75 mL/hr at 03/25/14 0545    Data Reviewed: Basic Metabolic Panel:  Recent Labs Lab 03/25/14 0040  NA 138  K 4.7  CL 100  CO2 26  GLUCOSE 68*  BUN 37*  CREATININE 1.69*  CALCIUM 9.1   Liver Function Tests:  Recent Labs Lab 03/25/14 0040  AST 27  ALT 21  ALKPHOS 103  BILITOT 0.5  PROT 5.7*  ALBUMIN 2.6*    Recent Labs Lab 03/25/14 0040  LIPASE 7*   No results found for this basename: AMMONIA,  in the last 168 hours CBC: No results found for this  basename: WBC, NEUTROABS, HGB, HCT, MCV, PLT,  in the last 168 hours Cardiac Enzymes:  Recent Labs Lab 03/25/14 0040  CKTOTAL 8   BNP (last 3 results) No results found for this basename: PROBNP,  in the last 8760 hours CBG:  Recent Labs Lab 03/25/14 0028 03/25/14 0222 03/25/14 0331 03/25/14 0439 03/25/14 0732  GLUCAP 65* 66* 98 135* 171*    Recent Results (from the past 240 hour(s))  MRSA PCR SCREENING     Status: Abnormal   Collection Time    03/25/14  4:24 AM      Result Value Ref Range Status   MRSA by PCR POSITIVE (*) NEGATIVE Final   Comment:            The GeneXpert MRSA Assay (FDA     approved for NASAL specimens     only), is one component of a     comprehensive MRSA colonization     surveillance program. It is not     intended to diagnose MRSA  infection nor to guide or     monitor treatment for     MRSA infections.     CRITICAL RESULT CALLED TO, READ BACK BY AND VERIFIED WITH:     Alyssa Grove RN 03/25/14 0647 COSTELLO B     Studies:  Recent x-ray studies have been reviewed in detail by the Attending Physician  Time spent :      Erin Hearing, [place] [facility]  [phone] Pager [phone]   **If unable to reach the above provider after paging please contact the [place] @ [phone]  On-Call/Text Page:      [email or web address]      password [email or web address]  If 7PM-7AM, please contact night-coverage [email or web address] Password [email or web address] 03/25/2014, 10:57 AM   LOS: 0 days   Attending Patient was seen, examined,treatment plan was discussed with the  Advance Practice Provider.  I have directly reviewed the clinical findings, lab, imaging studies and management of this patient in detail. I have made the necessary changes to the above noted documentation, and agree with the documentation, as recorded by the Advance Practice Provider.   Admitted with Fall vs Syncope, likely secondary to hypoglycemia. Rest as above.  Nena Alexander MD [facility].

## 2014-03-26 DIAGNOSIS — E162 Hypoglycemia, unspecified: Secondary | ICD-10-CM

## 2014-03-26 DIAGNOSIS — T148 Other injury of unspecified body region: Secondary | ICD-10-CM

## 2014-03-26 LAB — HEMOGLOBIN A1C
Hgb A1c MFr Bld: 6.7 % — ABNORMAL HIGH (ref <5.7)
Mean Plasma Glucose: 146 mg/dL — ABNORMAL HIGH (ref <117)

## 2014-03-26 LAB — GLUCOSE, CAPILLARY
GLUCOSE-CAPILLARY: 122 mg/dL — AB (ref 70–99)
GLUCOSE-CAPILLARY: 93 mg/dL (ref 70–99)
Glucose-Capillary: 120 mg/dL — ABNORMAL HIGH (ref 70–99)
Glucose-Capillary: 122 mg/dL — ABNORMAL HIGH (ref 70–99)
Glucose-Capillary: 192 mg/dL — ABNORMAL HIGH (ref 70–99)

## 2014-03-26 LAB — URINE CULTURE
COLONY COUNT: NO GROWTH
Culture: NO GROWTH
SPECIAL REQUESTS: NORMAL

## 2014-03-26 LAB — BASIC METABOLIC PANEL
Anion gap: 11 (ref 5–15)
BUN: 30 mg/dL — AB (ref 6–23)
CALCIUM: 8.5 mg/dL (ref 8.4–10.5)
CO2: 25 meq/L (ref 19–32)
Chloride: 98 mEq/L (ref 96–112)
Creatinine, Ser: 1.54 mg/dL — ABNORMAL HIGH (ref 0.50–1.35)
GFR calc Af Amer: 42 mL/min — ABNORMAL LOW (ref >90)
GFR calc non Af Amer: 36 mL/min — ABNORMAL LOW (ref >90)
GLUCOSE: 98 mg/dL (ref 70–99)
Potassium: 4.4 mEq/L (ref 3.7–5.3)
SODIUM: 134 meq/L — AB (ref 137–147)

## 2014-03-26 LAB — CBC
HCT: 31.4 % — ABNORMAL LOW (ref 39.0–52.0)
HEMOGLOBIN: 10.3 g/dL — AB (ref 13.0–17.0)
MCH: 29.3 pg (ref 26.0–34.0)
MCHC: 32.8 g/dL (ref 30.0–36.0)
MCV: 89.2 fL (ref 78.0–100.0)
PLATELETS: 161 10*3/uL (ref 150–400)
RBC: 3.52 MIL/uL — AB (ref 4.22–5.81)
RDW: 15.2 % (ref 11.5–15.5)
WBC: 10.7 10*3/uL — AB (ref 4.0–10.5)

## 2014-03-26 MED ORDER — DEXTROSE-NACL 5-0.9 % IV SOLN
INTRAVENOUS | Status: DC
  Administered 2014-03-26: 15:00:00 via INTRAVENOUS

## 2014-03-26 MED ORDER — BICALUTAMIDE 50 MG PO TABS
50.0000 mg | ORAL_TABLET | Freq: Every day | ORAL | Status: DC
  Administered 2014-03-26 – 2014-03-27 (×2): 50 mg via ORAL
  Filled 2014-03-26 (×2): qty 1

## 2014-03-26 MED ORDER — DILTIAZEM HCL ER COATED BEADS 120 MG PO CP24
120.0000 mg | ORAL_CAPSULE | Freq: Every day | ORAL | Status: DC
Start: 2014-03-27 — End: 2014-03-27
  Administered 2014-03-27: 120 mg via ORAL
  Filled 2014-03-26: qty 1

## 2014-03-26 MED ORDER — INSULIN ASPART 100 UNIT/ML ~~LOC~~ SOLN
0.0000 [IU] | Freq: Three times a day (TID) | SUBCUTANEOUS | Status: DC
Start: 2014-03-27 — End: 2014-03-27
  Administered 2014-03-27: 3 [IU] via SUBCUTANEOUS
  Administered 2014-03-27: 2 [IU] via SUBCUTANEOUS

## 2014-03-26 NOTE — Evaluation (Signed)
Physical Therapy Evaluation Patient Details Name: [name] MRN: [id number] DOB: [birth year] Today's Date: 03/26/2014   History of Present Illness  Pt admitted from [facility] SNF after falling out of bed resulting in bruising and swelling above R eye and R UE skin tears. Pt also with hypoglycemia and PNA.  Pt with hx of melanoma in R LE, DM, Afib, dementia, CKD. Pt with low vision and is very HOH.  Clinical Impression  Pt agreeable to attempt mobility, but fatigues quickly.  Pt from [facility] and would benefit from returning to [facility] at [facility] level of care.  Will continue to follow while on acute.      Follow Up Recommendations SNF    Equipment Recommendations  None recommended by PT    Recommendations for Other Services       Precautions / Restrictions Precautions Precautions: Fall Restrictions Weight Bearing Restrictions: No      Mobility  Bed Mobility                  Transfers Overall transfer level: Needs assistance Equipment used: None Transfers: Sit to/from Stand Sit to Stand: Min assist         General transfer comment: pt demos good technique, however requires A for balance and safety.  pt only able to stand ~91min 2/2 fatigue.    Ambulation/Gait                Stairs            Wheelchair Mobility    Modified Rankin (Stroke Patients Only)       Balance Overall balance assessment: Needs assistance Sitting-balance support: No upper extremity supported;Feet supported Sitting balance-Leahy Scale: Fair Sitting balance - Comments: Unable to accept challenges.     Standing balance support: Bilateral upper extremity supported Standing balance-Leahy Scale: Poor                               Pertinent Vitals/Pain Pain Assessment: No/denies pain    Home Living Family/patient expects to be discharged to:: Skilled nursing facility                      Prior Function Level of Independence: Needs  assistance   Gait / Transfers Assistance Needed: Pt was a one person transfer to his w/c.  He used a w/c as his primary means of mobility.  ADL's / Homemaking Assistance Needed: Pt is assisted for eating, bathing, dressing and toileting.  Comments: PLOF from staff at [place]: Right    Extremity/Trunk Assessment   Upper Extremity Assessment: Defer to OT evaluation           Lower Extremity Assessment: Generalized weakness      Cervical / Trunk Assessment: Kyphotic  Communication   Communication: HOH  Cognition Arousal/Alertness: Awake/alert Behavior During Therapy: WFL for tasks assessed/performed Overall Cognitive Status: History of cognitive impairments - at baseline                      General Comments      Exercises        Assessment/Plan    PT Assessment Patient needs continued PT services  PT Diagnosis Generalized weakness   PT Problem List Decreased strength;Decreased activity tolerance;Decreased balance;Decreased mobility;Decreased coordination;Decreased cognition;Decreased knowledge of use of DME;Decreased safety awareness  PT Treatment Interventions DME instruction;Gait training;Therapeutic activities;Functional mobility  training;Therapeutic exercise;Balance training;Patient/family education   PT Goals (Current goals can be found in the Care Plan section) Acute Rehab PT Goals Patient Stated Goal: Pt wants to be more mobile. PT Goal Formulation: With patient Time For Goal Achievement: 04/09/14 Potential to Achieve Goals: Fair    Frequency Min 2X/week   Barriers to discharge        Co-evaluation               End of Session   Activity Tolerance: Patient tolerated treatment well Patient left: in chair;with call bell/phone within reach Nurse Communication: Mobility status         Time: 8280-0349 PT Time Calculation (min): 29 min   Charges:   PT Evaluation $Initial PT Evaluation Tier  I: 1 Procedure PT Treatments $Therapeutic Activity: 8-22 mins   PT G CodesCatarina Hartshorn, [place] [phone] 03/26/2014, 11:34 AM

## 2014-03-26 NOTE — Progress Notes (Addendum)
[facility]  TADARIUS MALAND UXL:[id number] DOB: 03/11/1914 DOA: 03/25/2014 PCP: Gara Kroner, MD  Brief narrative: 80 year old male with history of diabetes, hypertension, chronic atrial fibrillation, dementia, and chronic kidney disease who was was found on the floor in his room at his SNF. Unknown how long he had been down. He was found to have bruising and swelling of the right side of his forehead and skin tears to his right arm. EMS was called and upon their evaluation the patient was found to have a CBG of 55. He was given IV dextrose bolus.  After arrival to the ER repeat CBG was 130 but later the CBG went back down to 65 so D10 infusion was initiated. In review of patient's preadmission medications he was on twice a day Glucotrol as well as Lantus. CT of the head and cervical spine were without any acute injuries. X-rays of the right arm were also negative for acute fracture. Chest x-ray was read as right greater than left pleural effusions and associated airspace opacities atelectasis versus infiltrate. Patient was not hypoxic. Patient appeared to be volume depleted with a BUN of 37 and a creatinine of 1.69. Baseline renal function appears to be BUN 20 and creatinine 1.4.  HPI/Subjective: Patient alert and sitting up in chair. No specific complaints. Requesting some fruit.  Assessment/Plan:  DM2 with Hypoglycemia on Lantus 4 units at hour sleep as well as Glucotrol 10 mg in the a.m. and 7.5 mg in the p.m. prior to admission - given patient's underlying CKD 3 expect several day wash out period for Glucotrol - D10 was dc'd 10/29 in favor of D5W and CBGs have been consistently > 80 - tolerating diet but still OFF insulin and Glucotrol - will decrease D5W to [facility] and follow - given adv age and CKD would NOT use sulfonylurea and instead use Lantus with SSI - HgbA1c down to 7.4 from a high of 9.0 in 2013  Abnormal chest x-ray Possible effusions +/-  pneumonia - leukocytosis (CBC checked after admit) has trended downward - no clinical findings to suggest and actual infectious process - stop abx and follow   Fall Unwitnessed so unclear if mechanical or true syncope - most likely related to dehydration as well as hypoglycemia - patient has underlying atrial fibrillation so could have been an arrhythmia based - continue telemetry - PT/OT evaluations - CT cervical spine without acute injury   HTN  Current blood pressures are well controlled on Cardizem, Imdur and metoprolol - given recent fall we'll check orthostatic vital signs and if indicated adjust medications  Acute renal failure on CKD stage 3 Continue IV fluids - follow labs  Atrial fibrillation Currently rate controlled and no apparent RVR or other arrhythmias - on CCB and BB - given advanced age and apparent recurrent falls and poor renal function is not an appropriate candidate for anticoagulation  Intermittent bradycardia HR has dipped as low as 30 episodically - pt asymptomatic laying in bed - cont to follow on tele - decrease CCB and stop BB - follow   Chronic Systolic CHF EF 53-66% via TTE July 2015 - appears euvolemic to dry at present  Chronic lymphedema RLE / prior excision melanoma Stable - no DVT per venous duplex July 2015  Hypothyroidism Continue Synthroid - ck TSH  Contusion CT head reveals only soft tissue injury and no acute abnormalities - try to utilize non narcotic medications for pain  Dementia  BPH/known  history of prostate cancer Continue Flomax  MRSA screen +   DVT prophylaxis: SCDs Code Status: DO NOT RESUSCITATE Family Communication: spoke w/ daughter via telephone  Disposition Plan/Expected LOS: Transfer to Telemetry - dc back to SNF once diabetes regimen adjusted and safely tolerating meds without hypoglycemia  Consultants: None  Procedures: None  Antibiotics: Zithromax 10/28  Rocephin 10/28  Zosyn 10/28 >> 10/29 Vancomycin 10/28  >> 10/29  Objective: Blood pressure 118/65, pulse 71, temperature 97.4 F (36.3 C), temperature source Oral, resp. rate 26, height 5\' 10"  (1.778 m), weight 177 lb 4 oz (80.4 kg), SpO2 98.00%.  Intake/Output Summary (Last 24 hours) at 03/26/14 1660 Last data filed at 03/26/14 0900  Gross per 24 hour  Intake 1932.5 ml  Output   1525 ml  Net  407.5 ml   Exam: Gen: No acute respiratory distress - visually impaired and HOH Chest: Clear to auscultation bilaterally without wheezes, rhonchi or crackles, room air Cardiac: irreg rhythm - regular rate, no rub murmurs or gallop Abdomen: Soft nontender nondistended without obvious hepatosplenomegaly, no ascites Extremities: changes c/w chronic lymphedema RLE - no cyanosis or clubbing B LE   Scheduled Meds:  Scheduled Meds: . aspirin  81 mg Oral QHS  . Chlorhexidine Gluconate Cloth  6 each Topical Q0600  . diltiazem  180 mg Oral Daily  . divalproex  250 mg Oral Daily  . fluticasone  1 spray Each Nare BID  . insulin aspart  0-9 Units Subcutaneous 6 times per day  . isosorbide mononitrate  30 mg Oral Daily  . levothyroxine  125 mcg Oral QAC breakfast  . loratadine  10 mg Oral Daily  . metoprolol  50 mg Oral BID  . multivitamin with minerals  1 tablet Oral Daily  . mupirocin ointment  1 application Nasal BID  . piperacillin-tazobactam (ZOSYN)  IV  3.375 g Intravenous Q8H  . senna-docusate  1 tablet Oral BID  . tamsulosin  0.4 mg Oral Daily  . vancomycin  1,000 mg Intravenous Q24H   Continuous Infusions: . dextrose 50 mL/hr at 03/25/14 1257    Data Reviewed: Basic Metabolic Panel:  Recent Labs Lab 03/25/14 0040 03/26/14 0317  NA 138 134*  K 4.7 4.4  CL 100 98  CO2 26 25  GLUCOSE 68* 98  BUN 37* 30*  CREATININE 1.69* 1.54*  CALCIUM 9.1 8.5   Liver Function Tests:  Recent Labs Lab 03/25/14 0040  AST 27  ALT 21  ALKPHOS 103  BILITOT 0.5  PROT 5.7*  ALBUMIN 2.6*    Recent Labs Lab 03/25/14 0040  LIPASE 7*    CBC:  Recent Labs Lab 03/25/14 1248 03/26/14 0317  WBC 14.4* 10.7*  NEUTROABS 9.9*  --   HGB 10.3* 10.3*  HCT 32.0* 31.4*  MCV 92.0 89.2  PLT 158 161   Cardiac Enzymes:  Recent Labs Lab 03/25/14 0040  CKTOTAL 8   CBG:  Recent Labs Lab 03/25/14 1628 03/25/14 2009 03/26/14 0058 03/26/14 0425 03/26/14 0836  GLUCAP 117* 108* 122* 93 120*    Recent Results (from the past 240 hour(s))  CULTURE, BLOOD (ROUTINE X 2)     Status: None   Collection Time    03/25/14  1:47 AM      Result Value Ref Range Status   Specimen Description BLOOD RIGHT ARM   Final   Special Requests BOTTLES DRAWN AEROBIC ONLY 5CC   Final   Culture  Setup Time     Final   Value: 03/25/2014  10:39     Performed at [facility]     Final   Value:        BLOOD CULTURE RECEIVED NO GROWTH TO DATE CULTURE WILL BE HELD FOR 5 DAYS BEFORE ISSUING A FINAL NEGATIVE REPORT     Performed at [facility]   Report Status PENDING   Incomplete  CULTURE, BLOOD (ROUTINE X 2)     Status: None   Collection Time    03/25/14  1:55 AM      Result Value Ref Range Status   Specimen Description BLOOD LEFT ARM   Final   Special Requests BOTTLES DRAWN AEROBIC AND ANAEROBIC 10CC EACH   Final   Culture  Setup Time     Final   Value: 03/25/2014 10:39     Performed at [facility]   Culture     Final   Value:        BLOOD CULTURE RECEIVED NO GROWTH TO DATE CULTURE WILL BE HELD FOR 5 DAYS BEFORE ISSUING A FINAL NEGATIVE REPORT     Performed at [facility]   Report Status PENDING   Incomplete  MRSA PCR SCREENING     Status: Abnormal   Collection Time    03/25/14  4:24 AM      Result Value Ref Range Status   MRSA by PCR POSITIVE (*) NEGATIVE Final   Comment:            The GeneXpert MRSA Assay (FDA     approved for NASAL specimens     only), is one component of a     comprehensive MRSA colonization     surveillance program. It is not     intended to diagnose MRSA      infection nor to guide or     monitor treatment for     MRSA infections.     CRITICAL RESULT CALLED TO, READ BACK BY AND VERIFIED WITH:     Alyssa Grove RN 03/25/14 0647 COSTELLO B     Studies:  Recent x-ray studies have been reviewed in detail by the Attending Physician  Time spent :   Erin Hearing, [place] [facility] Office  [phone] Pager [phone]  **If unable to reach the above provider after paging please contact the [place] @ [phone]  On-Call/Text Page:      [email or web address]      password [email or web address]  If 7PM-7AM, please contact night-coverage [email or web address] Password [email or web address] 03/26/2014, 9:18 AM   LOS: 1 day   I have personally examined this patient and reviewed the entire database. I have reviewed the above note, made any necessary editorial changes, and agree with its content.  Cherene Altes, MD [facility]

## 2014-03-26 NOTE — Clinical Social Work Psychosocial (Signed)
Clinical Social Work Department BRIEF PSYCHOSOCIAL ASSESSMENT 03/26/2014  Patient:  [name], [name]     Account Number:  [id number]     Admit date:  03/25/2014  Clinical Social Worker:  Domenica Reamer, [place]  Date/Time:  03/26/2014 01:11 PM  Referred by:  Physician  Date Referred:  03/25/2014 Referred for  SNF Placement   Other Referral:   Interview type:  Family Other interview type:    PSYCHOSOCIAL DATA Living Status:  FACILITY Admitted from facility:  [facility] Level of care:  [place] Primary support name:  [name] Primary support relationship to patient:  CHILD, ADULT Degree of support available:   CSW spoke with patients son, [name], concerning patient being admitted from [place].  Patients son reported that patient is from the skilled portion of [place]. Patients son is agreeable for return to [facility]. CSW will continue to follow.    CURRENT CONCERNS Current Concerns  Post-Acute Placement   Other Concerns:    SOCIAL WORK ASSESSMENT / PLAN FL2 update   Assessment/plan status:  Psychosocial Support/Ongoing Assessment of Needs Other assessment/ plan:   Information/referral to community resources:   [facility]    PATIENT'S/FAMILY'S RESPONSE TO PLAN OF CARE: Patients son is agreeable to return to [facility] where patient has resided since July of this year       Domenica Reamer, [facility] Social Worker [phone]

## 2014-03-27 DIAGNOSIS — R938 Abnormal findings on diagnostic imaging of other specified body structures: Secondary | ICD-10-CM

## 2014-03-27 DIAGNOSIS — W19XXXA Unspecified fall, initial encounter: Secondary | ICD-10-CM

## 2014-03-27 LAB — BASIC METABOLIC PANEL
Anion gap: 12 (ref 5–15)
BUN: 28 mg/dL — AB (ref 6–23)
CO2: 23 mEq/L (ref 19–32)
CREATININE: 1.46 mg/dL — AB (ref 0.50–1.35)
Calcium: 8.7 mg/dL (ref 8.4–10.5)
Chloride: 103 mEq/L (ref 96–112)
GFR calc Af Amer: 44 mL/min — ABNORMAL LOW (ref >90)
GFR calc non Af Amer: 38 mL/min — ABNORMAL LOW (ref >90)
GLUCOSE: 156 mg/dL — AB (ref 70–99)
POTASSIUM: 4.9 meq/L (ref 3.7–5.3)
Sodium: 138 mEq/L (ref 137–147)

## 2014-03-27 LAB — CBC
HCT: 35.1 % — ABNORMAL LOW (ref 39.0–52.0)
HEMOGLOBIN: 11.2 g/dL — AB (ref 13.0–17.0)
MCH: 28.8 pg (ref 26.0–34.0)
MCHC: 31.9 g/dL (ref 30.0–36.0)
MCV: 90.2 fL (ref 78.0–100.0)
Platelets: 167 10*3/uL (ref 150–400)
RBC: 3.89 MIL/uL — ABNORMAL LOW (ref 4.22–5.81)
RDW: 15.1 % (ref 11.5–15.5)
WBC: 12.2 10*3/uL — ABNORMAL HIGH (ref 4.0–10.5)

## 2014-03-27 LAB — GLUCOSE, CAPILLARY
GLUCOSE-CAPILLARY: 160 mg/dL — AB (ref 70–99)
Glucose-Capillary: 217 mg/dL — ABNORMAL HIGH (ref 70–99)

## 2014-03-27 LAB — MAGNESIUM: Magnesium: 2.3 mg/dL (ref 1.5–2.5)

## 2014-03-27 MED ORDER — HYDROCODONE-ACETAMINOPHEN 5-325 MG PO TABS: 1.0000 | ORAL_TABLET | ORAL | Status: AC | PRN

## 2014-03-27 MED ORDER — DILTIAZEM HCL ER COATED BEADS 120 MG PO CP24: 120.0000 mg | ORAL_CAPSULE | Freq: Every day | ORAL | Status: AC

## 2014-03-27 MED ORDER — INSULIN ASPART 100 UNIT/ML ~~LOC~~ SOLN: SUBCUTANEOUS | Status: AC

## 2014-03-27 NOTE — Clinical Social Work Note (Signed)
CSW made aware by RN patient is ready for D/C to SNF bed at [facility]. CSW contacted facility and confirmed bed availability with RN supervisor.  CSW met with patient who was sitting up in chair and pleasant. Patient is agreeable to plans. CSW contacted patient's son [name]) and made him aware of d/c. Son agreeable to plans. CSW prepared d/c packet and placed in patient's shadow chart. CSW faxed d/c summary to facility. CSW provided RN with number for room and report. CSW to arrange transportation via [place]. No further needs. CSW signing off.     [place], [place] Weekend Clinical Social Worker [phone]

## 2014-03-27 NOTE — Discharge Summary (Signed)
PATIENT DETAILS Name: [name] Age: 80 y.o. Sex: male Date of Birth: 1914-03-15 MRN: [id number]. Admitting Physician: Theressa Millard, MD YBO:[id number],HENID W, MD  Admit Date: 03/25/2014 Discharge date: 03/27/2014  Recommendations for Outpatient Follow-up:  1. Admitted with Hypoglycemia-stopped Glipizide, holding Lantus for now. Only on SSI. 2. CBC/BMET in 1 week 3. Repeat CXR in 4-6 weeks.  PRIMARY DISCHARGE DIAGNOSIS:  Active Problems:   Hypothyroidism   HTN (hypertension)   DM (diabetes mellitus)   CKD (chronic kidney disease) stage 3, GFR 30-59 ml/min   Abnormal chest x-ray   Hypoglycemia   Fall   Contusion   Dementia   Atrial fibrillation      PAST MEDICAL HISTORY: Past Medical History  Diagnosis Date  . Prostate cancer   . Hypothyroidism   . Hyperglycemia   . DMII (diabetes mellitus, type 2)   . Prostate cancer, primary, with metastasis from prostate to other site 12/06/2011  . Prostate cancer metastatic to multiple sites   . A-fib   . CKD (chronic kidney disease)   . Dementia     DISCHARGE MEDICATIONS: Current Discharge Medication List    START taking these medications   Details  insulin aspart (NOVOLOG) 100 UNIT/ML injection 0-9 Units, Subcutaneous, 3 times daily with meals CBG < 70: implement hypoglycemia protocol CBG 70 - 120: 0 units CBG 121 - 150: 1 unit CBG 151 - 200: 2 units CBG 201 - 250: 3 units CBG 251 - 300: 5 units CBG 301 - 350: 7 units CBG 351 - 400: 9 units CBG > 400: call MD Qty: 10 mL, Refills: 11      CONTINUE these medications which have CHANGED   Details  diltiazem (CARDIZEM CD) 120 MG 24 hr capsule Take 1 capsule (120 mg total) by mouth daily.    HYDROcodone-acetaminophen (NORCO/VICODIN) 5-325 MG per tablet Take 1 tablet by mouth every 4 (four) hours as needed for moderate pain. Qty: 30 tablet, Refills: 0      CONTINUE these medications which have NOT CHANGED   Details  acetaminophen (TYLENOL) 500 MG tablet  Take 500 mg by mouth every 4 (four) hours as needed. Fever/pain    aspirin 81 MG chewable tablet Chew 81 mg by mouth at bedtime.    bicalutamide (CASODEX) 50 MG tablet Take 50 mg by mouth daily.    cetirizine (ZYRTEC) 10 MG tablet Take 10 mg by mouth daily.    divalproex (DEPAKOTE ER) 250 MG 24 hr tablet Take 250 mg by mouth daily.    fluticasone (FLONASE) 50 MCG/ACT nasal spray Place 1 spray into both nostrils 2 (two) times daily.    guaiFENesin (IOPHEN-NR) 100 MG/5ML liquid Take 200 mg by mouth every 6 (six) hours as needed for cough. For cough    isosorbide mononitrate (IMDUR) 30 MG 24 hr tablet Take 1 tablet (30 mg total) by mouth daily. Qty: 30 tablet, Refills: 11    levothyroxine (SYNTHROID, LEVOTHROID) 125 MCG tablet Take 125 mcg by mouth daily before breakfast.    loperamide (IMODIUM) 2 MG capsule Take 2 mg by mouth as needed for diarrhea or loose stools.    Melatonin 5 MG TABS Take 1 tablet by mouth at bedtime.    Multiple Vitamin (MULTIVITAMIN WITH MINERALS) TABS tablet Take 1 tablet by mouth daily.    nitroGLYCERIN (NITROSTAT) 0.4 MG SL tablet Place 1 tablet (0.4 mg total) under the tongue every 5 (five) minutes x 3 doses as needed for chest pain. Qty: 25 tablet, Refills:  6    sennosides-docusate sodium (SENOKOT-S) 8.6-50 MG tablet Take 1 tablet by mouth 2 (two) times daily.    Tamsulosin HCl (FLOMAX) 0.4 MG CAPS Take 0.4 mg by mouth daily.      STOP taking these medications     amoxicillin (AMOXIL) 500 MG capsule      glipiZIDE (GLUCOTROL) 10 MG tablet      glipiZIDE (GLUCOTROL) 5 MG tablet      insulin glargine (LANTUS) 100 UNIT/ML injection      metoprolol (LOPRESSOR) 50 MG tablet         ALLERGIES:   Allergies  Allergen Reactions  . Accupril [Quinapril Hcl] Other (See Comments)    unknown  . Naproxen Rash    BRIEF HPI:  See H&P, Labs, Consult and Test reports for all details in brief, patient was admitted for89 year old male with history of  diabetes, hypertension, chronic atrial fibrillation, dementia, and chronic kidney disease who was was found on the floor in his room at his SNF.EMS was called and upon their evaluation the patient was found to have a CBG of 55  CONSULTATIONS:   None  PERTINENT RADIOLOGIC STUDIES: Dg Chest 2 View  03/25/2014   CLINICAL DATA:  Fall  EXAM: CHEST  2 VIEW  COMPARISON:  02/23/2014  FINDINGS: Prominent cardiomediastinal contours, similar to prior. Atherosclerotic vascular calcifications. Layering pleural effusions, right greater than left. Interstitial prominence. No pneumothorax. No acute osseous finding.  IMPRESSION: Prominent cardiomediastinal contours, similar to prior.  Right greater than left pleural effusions and associated airspace opacities; atelectasis versus infiltrate.  Interstitial prominence may reflect interstitial edema.   Electronically Signed   By: Carlos Levering M.D.   On: 03/25/2014 02:13   Dg Forearm Right  03/25/2014   CLINICAL DATA:  Fall with humerus pain.  EXAM: RIGHT FOREARM - 2 VIEW  COMPARISON:  None.  FINDINGS: The distal forearm is oblique on frontal imaging, decreasing diagnostic sensitivity. There is no evidence of acute fracture or dislocation. Osteopenia.  IMPRESSION: No acute osseous findings.   Electronically Signed   By: Jorje Guild M.D.   On: 03/25/2014 02:14   Ct Head Wo Contrast  03/25/2014   CLINICAL DATA:  Fall, right upper forehead hematoma.  EXAM: CT HEAD WITHOUT CONTRAST  CT CERVICAL SPINE WITHOUT CONTRAST  TECHNIQUE: Multidetector CT imaging of the head and cervical spine was performed following the standard protocol without intravenous contrast. Multiplanar CT image reconstructions of the cervical spine were also generated.  COMPARISON:  12/20/2013 head CT  FINDINGS: CT HEAD FINDINGS  Prominence of the sulci, cisterns, and ventricles, in keeping with cerebral and cerebellar volume loss. No definite CT evidence of an acute infarction. Subcortical and  periventricular white matter hypodensities, a nonspecific finding often seen in the setting of chronic microangiopathic change. No intraparenchymal hemorrhage, mass, mass effect, or abnormal extra-axial fluid collection. No overt hydrocephalus. Right frontal scalp hematoma. No underlying calvarial fracture.  CT CERVICAL SPINE FINDINGS  Mildly prominent lymph nodes along the cervical chains. Partially imaged bilateral pleural effusions measure water attenuation. Atherosclerotic vascular calcifications.  Maintained craniocervical relationship. No dens fracture. Grade 1 anterolisthesis of C4 on C5. Severe C5-6 and C6-7 degenerative disc disease. Multilevel facet arthropathy. No displaced acute fracture identified. Paravertebral soft tissues within normal limits.  IMPRESSION: Volume loss and white matter changes as above. No CT evidence of an acute intracranial abnormality.  Right frontal scalp hematoma.  No underlying calvarial fracture.  Multilevel degenerative changes of the cervical spine. No acute osseus  finding.   Electronically Signed   By: Carlos Levering M.D.   On: 03/25/2014 02:44   Ct Cervical Spine Wo Contrast  03/25/2014   CLINICAL DATA:  Fall, right upper forehead hematoma.  EXAM: CT HEAD WITHOUT CONTRAST  CT CERVICAL SPINE WITHOUT CONTRAST  TECHNIQUE: Multidetector CT imaging of the head and cervical spine was performed following the standard protocol without intravenous contrast. Multiplanar CT image reconstructions of the cervical spine were also generated.  COMPARISON:  12/20/2013 head CT  FINDINGS: CT HEAD FINDINGS  Prominence of the sulci, cisterns, and ventricles, in keeping with cerebral and cerebellar volume loss. No definite CT evidence of an acute infarction. Subcortical and periventricular white matter hypodensities, a nonspecific finding often seen in the setting of chronic microangiopathic change. No intraparenchymal hemorrhage, mass, mass effect, or abnormal extra-axial fluid  collection. No overt hydrocephalus. Right frontal scalp hematoma. No underlying calvarial fracture.  CT CERVICAL SPINE FINDINGS  Mildly prominent lymph nodes along the cervical chains. Partially imaged bilateral pleural effusions measure water attenuation. Atherosclerotic vascular calcifications.  Maintained craniocervical relationship. No dens fracture. Grade 1 anterolisthesis of C4 on C5. Severe C5-6 and C6-7 degenerative disc disease. Multilevel facet arthropathy. No displaced acute fracture identified. Paravertebral soft tissues within normal limits.  IMPRESSION: Volume loss and white matter changes as above. No CT evidence of an acute intracranial abnormality.  Right frontal scalp hematoma.  No underlying calvarial fracture.  Multilevel degenerative changes of the cervical spine. No acute osseus finding.   Electronically Signed   By: Carlos Levering M.D.   On: 03/25/2014 02:44   Dg Humerus Right  03/25/2014   CLINICAL DATA:  Fall.  Posterior humerus wound.  Initial encounter  EXAM: RIGHT HUMERUS - 2+ VIEW  COMPARISON:  None.  FINDINGS: There is no evidence of fracture or other focal bone lesions. Osteopenia.  IMPRESSION: No acute osseous findings.   Electronically Signed   By: Jorje Guild M.D.   On: 03/25/2014 02:13     PERTINENT LAB RESULTS: CBC:  Recent Labs  03/26/14 0317 03/27/14 0602  WBC 10.7* 12.2*  HGB 10.3* 11.2*  HCT 31.4* 35.1*  PLT 161 167   CMET CMP     Component Value Date/Time   NA 138 03/27/2014 0602   K 4.9 03/27/2014 0602   CL 103 03/27/2014 0602   CO2 23 03/27/2014 0602   GLUCOSE 156* 03/27/2014 0602   BUN 28* 03/27/2014 0602   CREATININE 1.46* 03/27/2014 0602   CALCIUM 8.7 03/27/2014 0602   PROT 5.7* 03/25/2014 0040   ALBUMIN 2.6* 03/25/2014 0040   AST 27 03/25/2014 0040   ALT 21 03/25/2014 0040   ALKPHOS 103 03/25/2014 0040   BILITOT 0.5 03/25/2014 0040   GFRNONAA 38* 03/27/2014 0602   GFRAA 44* 03/27/2014 0602    GFR Estimated Creatinine  Clearance: 29.2 ml/min (by C-G formula based on Cr of 1.46).  Recent Labs  03/25/14 0040  LIPASE 7*    Recent Labs  03/25/14 0040  CKTOTAL 8   No components found with this basename: POCBNP,  No results found for this basename: DDIMER,  in the last 72 hours  Recent Labs  03/26/14 0317  HGBA1C 6.7*   No results found for this basename: CHOL, HDL, LDLCALC, TRIG, CHOLHDL, LDLDIRECT,  in the last 72 hours No results found for this basename: TSH, T4TOTAL, FREET3, T3FREE, THYROIDAB,  in the last 72 hours No results found for this basename: VITAMINB12, FOLATE, FERRITIN, TIBC, IRON, RETICCTPCT,  in the last  72 hours Coags:  Recent Labs  03/25/14 0040  INR 1.19   Microbiology: Recent Results (from the past 240 hour(s))  CULTURE, BLOOD (ROUTINE X 2)     Status: None   Collection Time    03/25/14  1:47 AM      Result Value Ref Range Status   Specimen Description BLOOD RIGHT ARM   Final   Special Requests BOTTLES DRAWN AEROBIC ONLY 5CC   Final   Culture  Setup Time     Final   Value: 03/25/2014 10:39     Performed at [facility]   Culture     Final   Value:        BLOOD CULTURE RECEIVED NO GROWTH TO DATE CULTURE WILL BE HELD FOR 5 DAYS BEFORE ISSUING A FINAL NEGATIVE REPORT     Performed at [facility]   Report Status PENDING   Incomplete  CULTURE, BLOOD (ROUTINE X 2)     Status: None   Collection Time    03/25/14  1:55 AM      Result Value Ref Range Status   Specimen Description BLOOD LEFT ARM   Final   Special Requests BOTTLES DRAWN AEROBIC AND ANAEROBIC 10CC EACH   Final   Culture  Setup Time     Final   Value: 03/25/2014 10:39     Performed at [facility]   Culture     Final   Value:        BLOOD CULTURE RECEIVED NO GROWTH TO DATE CULTURE WILL BE HELD FOR 5 DAYS BEFORE ISSUING A FINAL NEGATIVE REPORT     Performed at [facility]   Report Status PENDING   Incomplete  URINE CULTURE     Status: None   Collection Time    03/25/14   2:15 AM      Result Value Ref Range Status   Specimen Description URINE, CATHETERIZED   Final   Special Requests Normal   Final   Culture  Setup Time     Final   Value: 03/25/2014 10:40     Performed at [facility]     Final   Value: NO GROWTH     Performed at [facility]   Culture     Final   Value: NO GROWTH     Performed at [facility]   Report Status 03/26/2014 FINAL   Final  MRSA PCR SCREENING     Status: Abnormal   Collection Time    03/25/14  4:24 AM      Result Value Ref Range Status   MRSA by PCR POSITIVE (*) NEGATIVE Final   Comment:            The GeneXpert MRSA Assay (FDA     approved for NASAL specimens     only), is one component of a     comprehensive MRSA colonization     surveillance program. It is not     intended to diagnose MRSA     infection nor to guide or     monitor treatment for     MRSA infections.     CRITICAL RESULT CALLED TO, READ BACK BY AND VERIFIED WITH:     Alyssa Grove RN 03/25/14 0647 [place] COURSE:   Active Problems: DM2 with Hypoglycemia:Was on Lantus 4 units QHS and Glucotrol 10 mg daily. Suspect etiology of hypoglycemia being use of  Glucotrol in a setting CKD 3. Patient was admitted and provided supportive care with D 10 infusion. This was slowly weaned off, currently stable, with no further hypoglycemic episodes. Will stop both Lantus and Glucotrol, given advanced age would be liberal with glycemic control. Will just be on SSI on discharge.   Abnormal chest x-ray:Possible effusions +/- pneumonia - leukocytosis (CBC checked after admit) has trended downward - no clinical findings to suggest and actual infectious process -Initially was placed on broad spectrum Antibiotics, however PNA felt to be unlikely, therefore they were stopped. Doing well without fever. Suggest repeat CXR in a few weeks.  Fall:Unwitnessed so unclear if mechanical or true syncope - most likely related to  dehydration as well as hypoglycemia - patient has underlying atrial fibrillation so could have been an arrhythmia based - continue telemetry - PT/OT evaluations - CT cervical spine without acute injury. Given advanced age, doubt further work up will be necessary at this point.  HTN:BP meds adjusted, stable with above noted medications.  Acute renal failure on CKD stage 3:secondary to dehydration, creatinine back to baseline following IVF.  Atrial fibrillation:Currently rate controlled and no apparent RVR or other arrhythmias - on Cardizem - given advanced age and apparent recurrent falls and poor renal function is not an appropriate candidate for anticoagulation  Intermittent bradycardia  HR has dipped as low as 30 episodically - pt asymptomatic laying in bed - monitored on tele - decreased CCB and stopped BB - Heart rate stable with these changes. Not a candidate for further work up/interventions. Continue to monitor closely while at SNF.  Chronic Systolic CHF  EF 87-56% via TTE July 2015 - appears euvolemic to dry at present   Chronic lymphedema RLE / prior excision melanoma  Stable - no DVT per venous duplex July 2015   Hypothyroidism  Continue Synthroid   Dementia-at baseline.  BPH/known history of prostate cancer  Continue Flomax   MRSA screen +  TODAY-DAY OF DISCHARGE:  Subjective:   Joycelyn Man today has no headache,no chest abdominal pain,no new weakness tingling or numbness  Objective:   Blood pressure 120/53, pulse 61, temperature 97.8 F (36.6 C), temperature source Oral, resp. rate 19, height 5\' 10"  (1.778 m), weight 80.468 kg (177 lb 6.4 oz), SpO2 97.00%.  Intake/Output Summary (Last 24 hours) at 03/27/14 0911 Last data filed at 03/27/14 0500  Gross per 24 hour  Intake    760 ml  Output   1000 ml  Net   -240 ml   Filed Weights   03/25/14 0443 03/26/14 2017  Weight: 80.4 kg (177 lb 4 oz) 80.468 kg (177 lb 6.4 oz)    Exam Awake Alert, No new F.N  deficits, Normal affect [place].AT,PERRAL Supple Neck,No JVD, No cervical lymphadenopathy appriciated.  Symmetrical Chest wall movement, Good air movement bilaterally, CTAB RRR,No Gallops,Rubs or new Murmurs, No Parasternal Heave +ve B.Sounds, Abd Soft, Non tender, No organomegaly appriciated, No rebound -guarding or rigidity. No Cyanosis, Clubbing or edema, No new Rash or bruise  DISCHARGE CONDITION: Stable  DISPOSITION: SNF  DISCHARGE INSTRUCTIONS:    Activity:  As tolerated with Full fall precautions use walker/cane & assistance as needed  Diet recommendation: Diabetic Diet Heart Healthy diet  Code Status: DNR  Discharge Instructions   Diet - low sodium heart healthy    Complete by:  As directed      Diet Carb Modified    Complete by:  As directed      Increase activity slowly  Complete by:  As directed            Follow-up Information   Follow up with Gara Kroner, MD. Schedule an appointment as soon as possible for a visit in 1 week.   Specialty:  Family Medicine   Contact information:   [address], [place] [address] [phone]       Total Time spent on discharge equals 45 minutes.  SignedOren Binet 03/27/2014 9:11 AM

## 2014-03-27 NOTE — Plan of Care (Signed)
Problem: Phase III Progression Outcomes Goal: Voiding independently Outcome: Completed/Met Date Met:  03/27/14 Patient able to void independently with urinal.

## 2014-03-31 LAB — CULTURE, BLOOD (ROUTINE X 2)
CULTURE: NO GROWTH
Culture: NO GROWTH

## 2014-11-26 DEATH — deceased

## 2015-01-22 IMAGING — CR DG CHEST 2V
2 series · 2 of 2 positions shown · non-contrast
Comparison: 02/23/2014

CLINICAL DATA: Fall

EXAM:
CHEST  2 VIEW

[x chest ap]
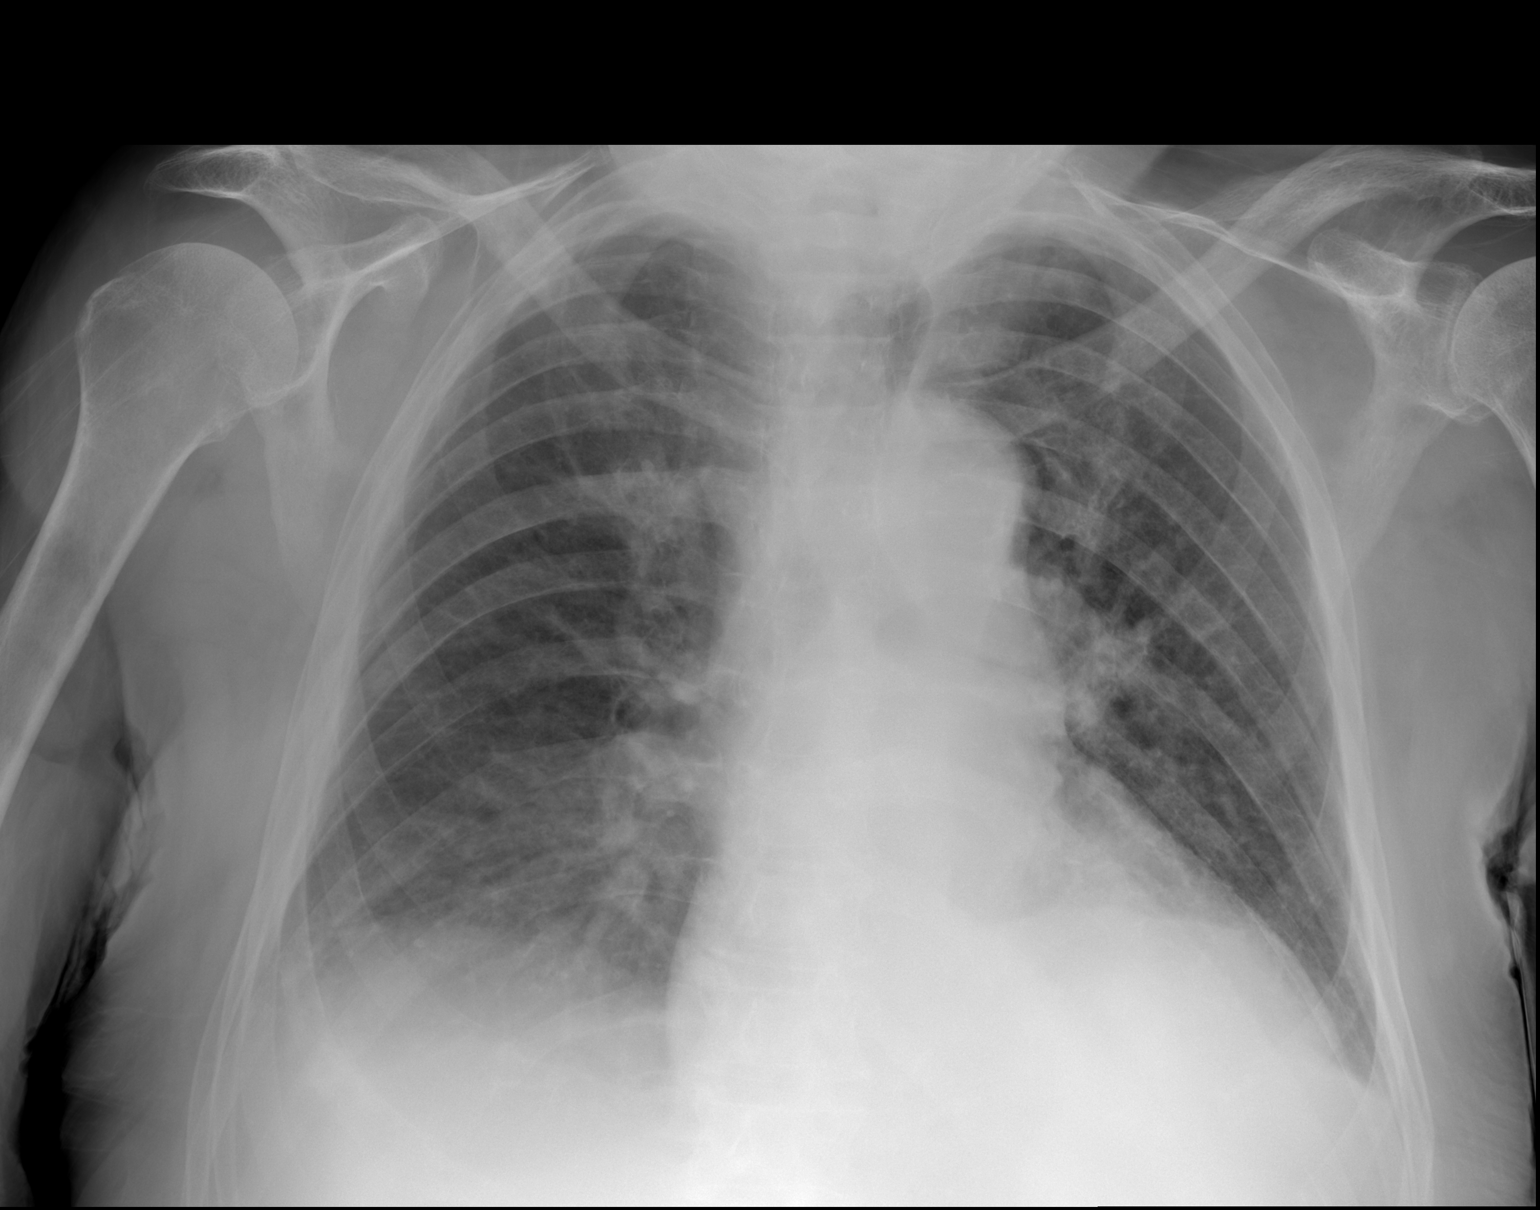

[w chest lat]
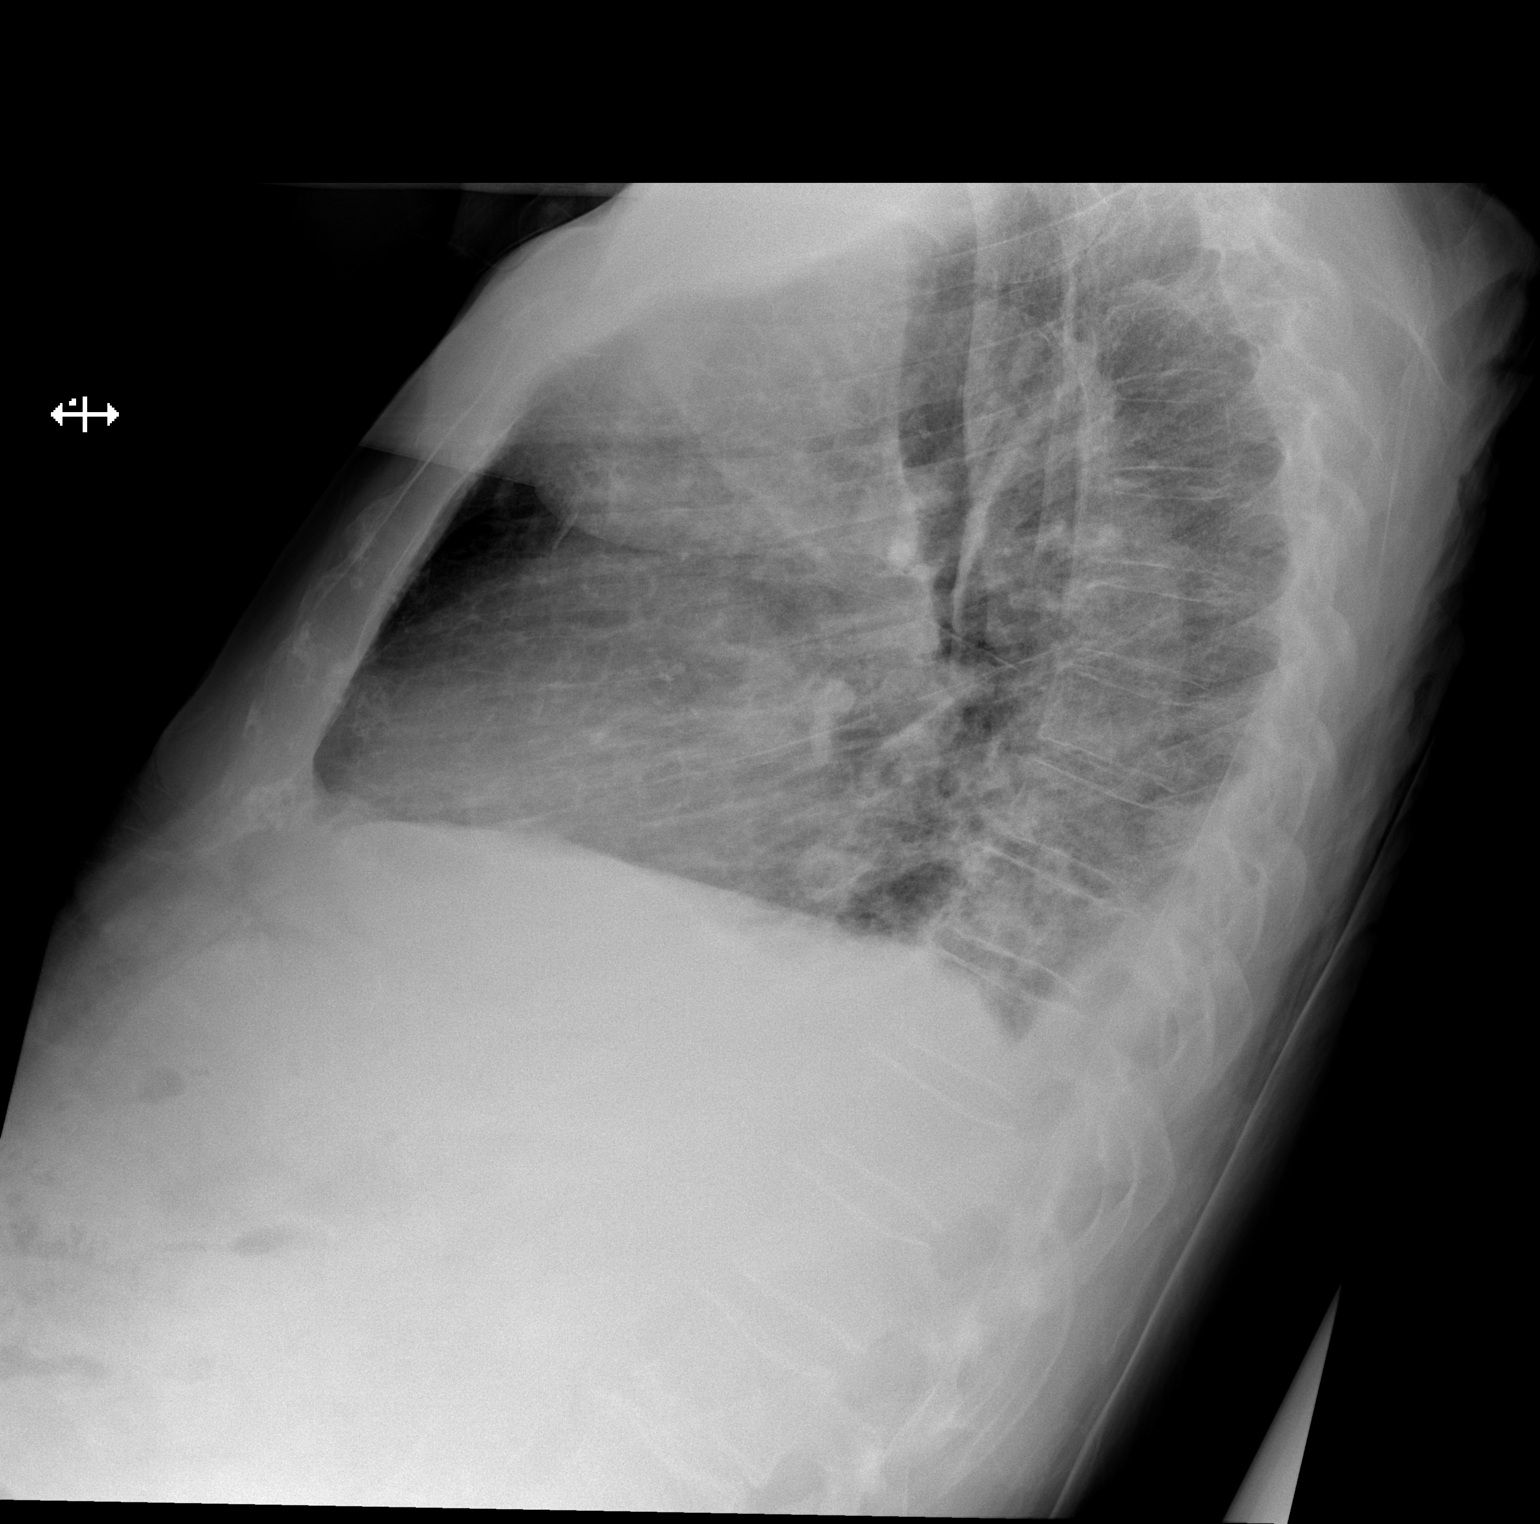

[2 of 2 positions shown; findings below may reference images not displayed]

FINDINGS: Prominent cardiomediastinal contours, similar to prior.
Atherosclerotic vascular calcifications. Layering pleural effusions,
right greater than left. Interstitial prominence. No pneumothorax.
No acute osseous finding.
IMPRESSION: Prominent cardiomediastinal contours, similar to prior.

Right greater than left pleural effusions and associated airspace
opacities; atelectasis versus infiltrate.

Interstitial prominence may reflect interstitial edema.

## 2015-01-22 IMAGING — CR DG HUMERUS 2V *R*
2 series · 2 of 2 positions shown · non-contrast
Comparison: None.

CLINICAL DATA: Fall.  Posterior humerus wound.  Initial encounter

EXAM:
RIGHT HUMERUS - 2+ VIEW

[x humerus ap right]
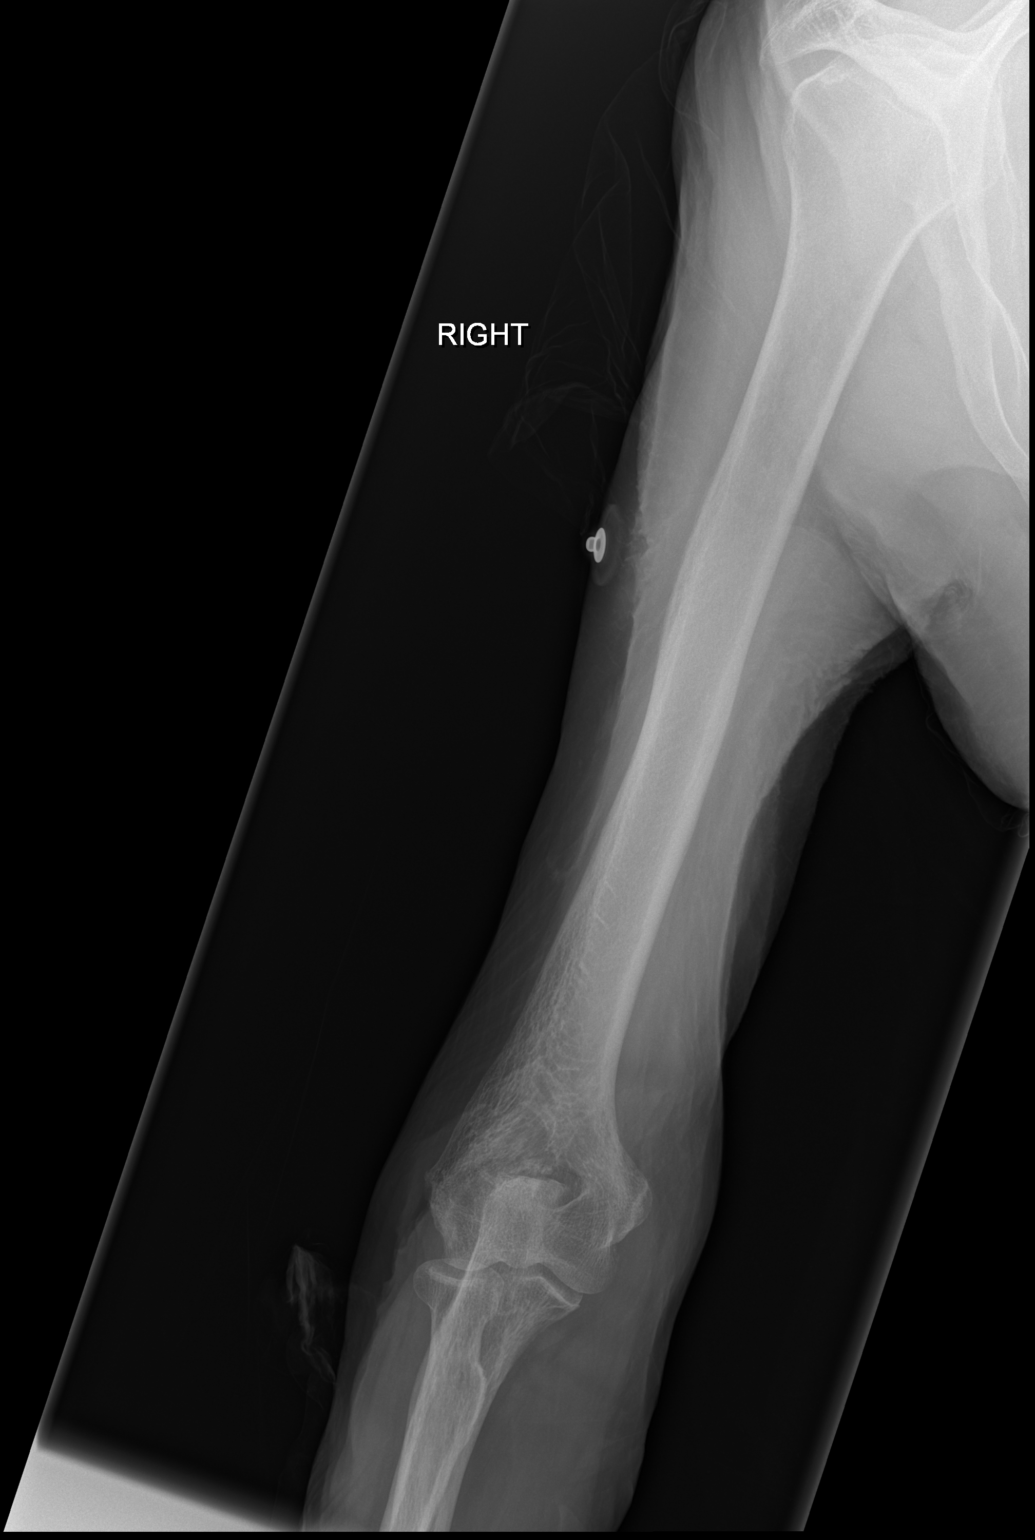

[x humerus lat right]
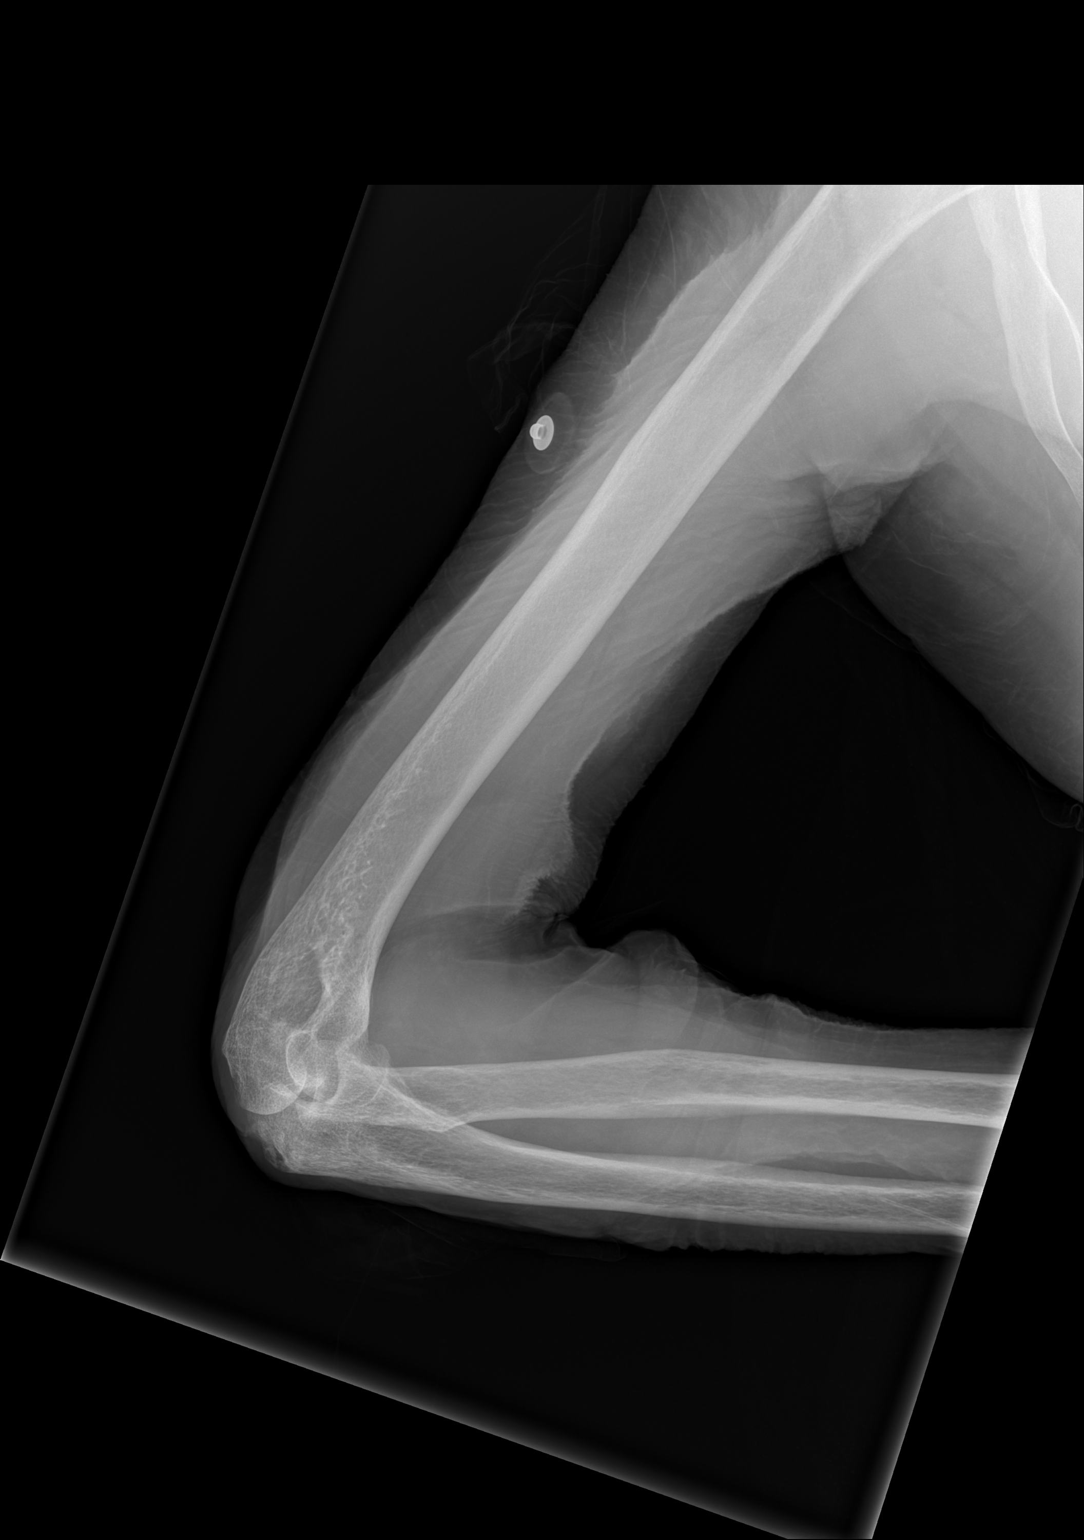

[2 of 2 positions shown; findings below may reference images not displayed]

FINDINGS: There is no evidence of fracture or other focal bone lesions.
Osteopenia.
IMPRESSION: No acute osseous findings.
